# Patient Record
Sex: Male | Born: 1945 | Race: White | Hispanic: No | Marital: Married | Smoking: Former smoker
Health system: Southern US, Community
[De-identification: ages and names within clinical notes are randomized; demographics above are authoritative.]

## PROBLEM LIST (undated history)

## (undated) DIAGNOSIS — D696 Thrombocytopenia, unspecified: Secondary | ICD-10-CM

## (undated) DIAGNOSIS — N400 Enlarged prostate without lower urinary tract symptoms: Secondary | ICD-10-CM

## (undated) DIAGNOSIS — R634 Abnormal weight loss: Secondary | ICD-10-CM

## (undated) DIAGNOSIS — D61818 Other pancytopenia: Secondary | ICD-10-CM

## (undated) DIAGNOSIS — K5900 Constipation, unspecified: Secondary | ICD-10-CM

## (undated) HISTORY — PX: APPENDECTOMY: SHX54

## (undated) HISTORY — DX: Constipation, unspecified: K59.00

## (undated) HISTORY — DX: Benign prostatic hyperplasia without lower urinary tract symptoms: N40.0

## (undated) HISTORY — DX: Abnormal weight loss: R63.4

## (undated) HISTORY — PX: EYE SURGERY: SHX253

## (undated) HISTORY — DX: Other pancytopenia: D61.818

## (undated) HISTORY — DX: Thrombocytopenia, unspecified: D69.6

## (undated) MED FILL — Dexamethasone Sodium Phosphate Inj 100 MG/10ML: INTRAMUSCULAR | Qty: 2 | Status: AC

---

## 1999-09-05 ENCOUNTER — Emergency Department (HOSPITAL_COMMUNITY): Admission: EM | Admit: 1999-09-05 | Discharge: 1999-09-06 | Payer: Self-pay | Admitting: Emergency Medicine

## 1999-09-06 ENCOUNTER — Emergency Department (HOSPITAL_COMMUNITY): Admission: EM | Admit: 1999-09-06 | Discharge: 1999-09-06 | Payer: Self-pay | Admitting: Emergency Medicine

## 1999-09-06 ENCOUNTER — Encounter: Payer: Self-pay | Admitting: Emergency Medicine

## 2000-03-04 ENCOUNTER — Encounter: Payer: Self-pay | Admitting: Internal Medicine

## 2000-03-04 ENCOUNTER — Observation Stay (HOSPITAL_COMMUNITY): Admission: AD | Admit: 2000-03-04 | Discharge: 2000-03-05 | Payer: Self-pay | Admitting: Internal Medicine

## 2000-03-05 ENCOUNTER — Encounter: Payer: Self-pay | Admitting: Internal Medicine

## 2002-07-29 DIAGNOSIS — N400 Enlarged prostate without lower urinary tract symptoms: Secondary | ICD-10-CM

## 2002-07-29 HISTORY — DX: Benign prostatic hyperplasia without lower urinary tract symptoms: N40.0

## 2007-09-06 ENCOUNTER — Emergency Department (HOSPITAL_COMMUNITY): Admission: EM | Admit: 2007-09-06 | Discharge: 2007-09-06 | Payer: Self-pay | Admitting: Emergency Medicine

## 2008-03-09 ENCOUNTER — Ambulatory Visit: Payer: Self-pay | Admitting: Vascular Surgery

## 2009-07-29 HISTORY — PX: TESTICLE SURGERY: SHX794

## 2009-08-08 ENCOUNTER — Ambulatory Visit (HOSPITAL_BASED_OUTPATIENT_CLINIC_OR_DEPARTMENT_OTHER): Admission: RE | Admit: 2009-08-08 | Discharge: 2009-08-08 | Payer: Self-pay | Admitting: Urology

## 2010-10-14 LAB — BASIC METABOLIC PANEL
BUN: 14 mg/dL (ref 6–23)
CO2: 29 mEq/L (ref 19–32)
Calcium: 9.1 mg/dL (ref 8.4–10.5)
Chloride: 103 mEq/L (ref 96–112)
Creatinine, Ser: 1.09 mg/dL (ref 0.4–1.5)
GFR calc Af Amer: >60 mL/min (ref >60)
GFR calc non Af Amer: >60 mL/min (ref >60)
Glucose, Bld: 98 mg/dL (ref 70–99)
Potassium: 4.3 mEq/L (ref 3.5–5.1)
Sodium: 138 mEq/L (ref 135–145)

## 2010-10-14 LAB — CBC
HCT: 39.4 % (ref 39.0–52.0)
Hemoglobin: 13.5 g/dL (ref 13.0–17.0)
MCHC: 34.2 g/dL (ref 30.0–36.0)
MCV: 89.9 fL (ref 78.0–100.0)
Platelets: 174 10*3/uL (ref 150–400)
RBC: 4.38 MIL/uL (ref 4.22–5.81)
RDW: 13 % (ref 11.5–15.5)
WBC: 4.2 10*3/uL (ref 4.0–10.5)

## 2010-12-11 NOTE — Consult Note (Signed)
NEW PATIENT CONSULTATION   Brian Levine  DOB:  12-12-1945                                       03/09/2008  ZOXWR#:[id number]   The patient presents today for evaluation of left leg venous pathology.  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  He denies any  discomfort with this, but was concerned regarding the progressive  nature.  He does not have any pain associated with this.  He does not  have any history of DVT- and does not have any swelling or otherwise leg  pain.  He does have a family history of varicosities in his mother.  He  is on no medications for pain.  He has not worn compression garments.  He is retired.   CURRENT MEDICATIONS:  Doxazosin and treatment for BPH.   He does not smoke cigarettes.   PHYSICAL EXAM:  Well-developed, well-nourished white male appearing  stated age of 65.  Blood pressure is 142/91, pulse 77, respirations 18.  His dorsalis pedis pulses are 2+ bilaterally.  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   I imaged this with hand held duplex.  It does have a connection at the  upper end of this varicosity that goes through the fascia.  He does not  have any pain associated with this, and it does not communicate with the  saphenous system.  His saphenous veins appear normal on the screening  study.  I discussed this at length with the patient.  I explained that  this does not preclude him to any more serious complications with venous  pathology, specifically any deep vein thrombosis.  I explained that the  indications for treatment would be for pain, which he does not have, or  from an appearance standpoint, which he is not concerned about.  He is  comfortable with discussion and will see [place] again on an as needed basis.   Larina Earthly, M.D.  Electronically Signed   TFE/MEDQ  D:  03/09/2008  T:  03/10/2008  Job:  [id number]

## 2010-12-14 NOTE — H&P (Signed)
[place]  Patient:    Brian Levine, Brian Levine                           MRN: [id number] Proc. Date: 03/04/00 Adm. Date:  03/04/00 Attending:  Thora Lance, M.D.                         History and Physical  CHIEF COMPLAINT: Chest pressure.  HISTORY OF PRESENT ILLNESS: This patient is a 65 year old white male who presents with chest pressure yesterday while digging a trench at home.  He had several episodes of retrosternal chest pressure associated with shortness of breath and fatigue.  The pressure would go away in minutes with rest.  This morning he had prolonged mild chest pressure while driving with minimal exertion and mild shortness of breath.  This has now resolved in our office. His cardiac risk factors include history of tobacco use and a family history of coronary artery disease.  PAST MEDICAL HISTORY:  1. Benign prostatic hypertrophy.  2. Nephrolithiasis.  3. Right hydrocele.  PAST SURGICAL HISTORY:  1. Excision of scrotal mass in 1970.  2. Appendectomy as a child.  ALLERGIES: No known drug allergies.  CURRENT MEDICATIONS:  1. Vitamin C 500 mg q.d.  2. Vitamin E 400 IU q.d.  4. Enteric-coated aspirin 325 mg q.d.  FAMILY HISTORY: Father, coronary disease in his 65s and died of MI/congestive heart failure at age 76.  Had hypertension and stroke.  Mother alive at age 59, history of MI in her 25s.  Brother with hypertension, possible renal cancer.  Brother and daughter in good health.  SOCIAL HISTORY: The patient had been smoking two packs of cigarettes a day for 33 years and quite five years ago.  No drug or alcohol use.  He is married and has one daughter.  Occupation is Retail banker for [employer].  REVIEW OF SYSTEMS: Negative.  PHYSICAL EXAMINATION:  GENERAL: He looks well in the office.  VITAL SIGNS: Blood pressure 134/80, heart rate 88, temperature 98.0 degrees.  HEENT: PERRL.  EOMI.  Tympanic membranes clear.  Oral  cavity and oropharynx clear.  NECK: Supple.  No lymphadenopathy, no carotid bruits.  LUNGS: Clear.  HEART: Regular rate and rhythm without murmurs, rubs, or gallops.  No JVD.  ABDOMEN: Soft, nontender.  Normal bowel sounds.  No mass or hepatosplenomegaly.  RECTAL: Normal tone, Hemoccult negative stool.  GU: Large right hydrocele noted.  EXTREMITIES: No edema.  NEUROLOGIC: Nonfocal.  LABORATORY DATA: Laboratories are pending.  EKG shows normal sinus rhythm, normal EKG.  ASSESSMENT: Chest pressure, rule out unstable angina pectoris.  PLAN: Observation.  Lovenox subQ.  Aspirin.  Nitroglycerin p.r.n. chest pain. Stress Cardiolite.  Cardiac enzymes. DD:  03/04/00 TD:  03/04/00 Job: [id number]

## 2011-04-19 LAB — POCT CARDIAC MARKERS
CKMB, poc: <1 — ABNORMAL LOW
CKMB, poc: <1 — ABNORMAL LOW
Myoglobin, poc: 41.9
Myoglobin, poc: 59.8
Operator id: 284251
Operator id: 285491
Troponin i, poc: <0.05
Troponin i, poc: <0.05

## 2011-04-19 LAB — I-STAT 8, (EC8 V) (CONVERTED LAB)
Acid-Base Excess: 1
BUN: 15
Bicarbonate: 25.2 — ABNORMAL HIGH
Chloride: 107
Glucose, Bld: 98
HCT: 42
Hemoglobin: 14.3
Operator id: 285491
Potassium: 4.2
Sodium: 139
TCO2: 26
pCO2, Ven: 37.8 — ABNORMAL LOW
pH, Ven: 7.432 — ABNORMAL HIGH

## 2011-04-19 LAB — POCT I-STAT CREATININE
Creatinine, Ser: 1.1
Operator id: 285491

## 2012-04-29 ENCOUNTER — Other Ambulatory Visit: Payer: Self-pay | Admitting: Urology

## 2012-04-29 DIAGNOSIS — N4340 Spermatocele of epididymis, unspecified: Secondary | ICD-10-CM

## 2012-06-15 ENCOUNTER — Ambulatory Visit (HOSPITAL_COMMUNITY): Payer: Medicare Other

## 2013-04-16 ENCOUNTER — Telehealth: Payer: Self-pay | Admitting: Hematology and Oncology

## 2013-04-16 ENCOUNTER — Telehealth: Payer: Self-pay | Admitting: Internal Medicine

## 2013-04-16 NOTE — Telephone Encounter (Signed)
LM W/WIFE FOR PT TO RETURN CALL IN RE TO REFERRAL

## 2013-04-16 NOTE — Telephone Encounter (Signed)
S/W PT AND [facility] PT APPT 10/07 @ 10:30 W/DR. GORSUCH REFERRING DR. Jonny Ruiz GRIFFIN  DX- MILD ANEMIA AND THROMBOCYTOPENIA WELCOME PACKET MAILED.

## 2013-04-16 NOTE — Telephone Encounter (Signed)
C/D 04/16/13 for appt. 10/7

## 2013-05-04 ENCOUNTER — Telehealth: Payer: Self-pay | Admitting: Hematology and Oncology

## 2013-05-04 ENCOUNTER — Ambulatory Visit (HOSPITAL_BASED_OUTPATIENT_CLINIC_OR_DEPARTMENT_OTHER): Payer: Medicare Other | Admitting: Lab

## 2013-05-04 ENCOUNTER — Ambulatory Visit (HOSPITAL_BASED_OUTPATIENT_CLINIC_OR_DEPARTMENT_OTHER): Payer: Medicare Other | Admitting: Hematology and Oncology

## 2013-05-04 ENCOUNTER — Encounter: Payer: Self-pay | Admitting: Hematology and Oncology

## 2013-05-04 ENCOUNTER — Ambulatory Visit: Payer: Medicare Other

## 2013-05-04 VITALS — BP 145/69 | HR 68 | Temp 97.5°F | Resp 20 | Ht 71.0 in | Wt 177.5 lb

## 2013-05-04 DIAGNOSIS — R634 Abnormal weight loss: Secondary | ICD-10-CM

## 2013-05-04 DIAGNOSIS — D61818 Other pancytopenia: Secondary | ICD-10-CM

## 2013-05-04 DIAGNOSIS — R109 Unspecified abdominal pain: Secondary | ICD-10-CM

## 2013-05-04 HISTORY — DX: Abnormal weight loss: R63.4

## 2013-05-04 HISTORY — DX: Other pancytopenia: D61.818

## 2013-05-04 LAB — CBC & DIFF AND RETIC
BASO%: 0.3 % (ref 0.0–2.0)
Basophils Absolute: 0 10*3/uL (ref 0.0–0.1)
EOS%: 5.4 % (ref 0.0–7.0)
Eosinophils Absolute: 0.2 10*3/uL (ref 0.0–0.5)
HCT: 39.2 % (ref 38.4–49.9)
HGB: 13.7 g/dL (ref 13.0–17.1)
Immature Retic Fract: 1.7 % — ABNORMAL LOW (ref 3.00–10.60)
LYMPH%: 30.4 % (ref 14.0–49.0)
MCH: 30.8 pg (ref 27.2–33.4)
MCHC: 34.9 g/dL (ref 32.0–36.0)
MCV: 88.1 fL (ref 79.3–98.0)
MONO#: 0.2 10*3/uL (ref 0.1–0.9)
MONO%: 6 % (ref 0.0–14.0)
NEUT#: 2 10*3/uL (ref 1.5–6.5)
NEUT%: 57.9 % (ref 39.0–75.0)
Platelets: 119 10*3/uL — ABNORMAL LOW (ref 140–400)
RBC: 4.45 10*6/uL (ref 4.20–5.82)
RDW: 13 % (ref 11.0–14.6)
Retic %: 0.78 % — ABNORMAL LOW (ref 0.80–1.80)
Retic Ct Abs: 34.71 10*3/uL — ABNORMAL LOW (ref 34.80–93.90)
WBC: 3.5 10*3/uL — ABNORMAL LOW (ref 4.0–10.3)
lymph#: 1.1 10*3/uL (ref 0.9–3.3)
nRBC: 0 % (ref 0–0)

## 2013-05-04 LAB — CHCC SMEAR

## 2013-05-04 LAB — COMPREHENSIVE METABOLIC PANEL (CC13)
ALT: 12 U/L (ref 0–55)
AST: 18 U/L (ref 5–34)
Albumin: 3.8 g/dL (ref 3.5–5.0)
Alkaline Phosphatase: 57 U/L (ref 40–150)
Anion Gap: 6 mEq/L (ref 3–11)
BUN: 14.3 mg/dL (ref 7.0–26.0)
CO2: 29 mEq/L (ref 22–29)
Calcium: 9.6 mg/dL (ref 8.4–10.4)
Chloride: 105 mEq/L (ref 98–109)
Creatinine: 1 mg/dL (ref 0.7–1.3)
Glucose: 97 mg/dl (ref 70–140)
Potassium: 4.3 mEq/L (ref 3.5–5.1)
Sodium: 140 mEq/L (ref 136–145)
Total Bilirubin: 1.06 mg/dL (ref 0.20–1.20)
Total Protein: 7.2 g/dL (ref 6.4–8.3)

## 2013-05-04 LAB — TSH CHCC: TSH: 2.647 m(IU)/L (ref 0.320–4.118)

## 2013-05-04 LAB — FERRITIN CHCC: Ferritin: 90 ng/ml (ref 22–316)

## 2013-05-04 NOTE — Progress Notes (Signed)
[facility] CONSULT NOTE  CHIEF COMPLAINTS/PURPOSE OF CONSULTATION: Pancytopenia for further management  HISTORY OF PRESENTING ILLNESS:  Brian Levine 67 y.o. male is being referred here because of abnormal CBC. I had the opportunity to review his CBC from 2008 11 and at that time it was within normal limits. 6 weeks ago, he complained of an onset of the abdomen no pain which he describes as a dull ache rate started in the lower abdomen region with no radiation. There was no aggravating factors or relieving factors. He did not take any analgesics. It subsequently resolved then returned approximately a week later lasting for one full day. The patient had a colonoscopy before and his screening was up-to-date with no cancer found. He went to see his primary care provider for routine visit and was recommended to have some blood work done. CBC came back mildly abnormal with a white count of 3.5 hemoglobin 13.5 and platelet, 132,000. Serum chemistries were normal. From the abdomen the pain described above, he denies any history of recurrent infection. He cannot remember when was the last time he was prescribed antibiotic therapy. Never hospitalized for pneumonia and never suffer from atypical infection such as shingles. He denies any spontaneous bruising, epistaxis, hematuria, or hematochezia. The patient did have history of kidney stone before. He complained of some right testicular swelling and had surgery twice, the last one was approximately 3 years ago but did not help with this testicular swelling. Most concerning is he has chronic weight loss for the last 5 years he has lost 40 pounds but he felt it was because of his effort of eating healthy and increased level of activity with regular exercise.  MEDICAL HISTORY:  Past Medical History  Diagnosis Date  . BPH (benign prostatic hyperplasia) 2004  . Weight loss 05/04/2013  . 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 Appendectomy    . Testicle surgery Right 2011    hydrocele    SOCIAL HISTORY: History   Social History  . Marital Status: Married    Spouse Name: N/A    Number of Children: N/A  . Years of Education: N/A   Occupational History  . Not on file.   Social History Main Topics  . Smoking status: Former Smoker -- 1.50 packs/day for 30 years    Quit date: 07/30/1995  . Smokeless tobacco: Never Used  . Alcohol Use: No  . Drug Use: No  . Sexual Activity: Not on file   Other Topics Concern  . Not on file   Social History Narrative  . No narrative on file    FAMILY HISTORY: History reviewed. No pertinent family history.  ALLERGIES:  has no allergies on file.  MEDICATIONS: Current outpatient prescriptions:aspirin 81 MG tablet, Take 81 mg by mouth daily., Disp: , Rfl: ;  cholecalciferol (VITAMIN D) 1000 UNITS tablet, Take 1,000 Units by mouth daily., Disp: , Rfl: ;  doxazosin (CARDURA) 8 MG tablet, Take 8 mg by mouth at bedtime., Disp: , Rfl: ;  finasteride (PROSCAR) 5 MG tablet, Take 5 mg by mouth daily., Disp: , Rfl:   REVIEW OF SYSTEMS:   Constitutional: Denies fevers, chills Eyes: Denies blurriness of vision, double vision or watery eyes Ears, nose, mouth, throat, and face: Denies mucositis or sore throat Respiratory: Denies cough, dyspnea or wheezes Cardiovascular: Denies palpitation, chest discomfort or lower extremity swelling Gastrointestinal:  Denies nausea, heartburn or change in bowel habits  Skin: Denies abnormal skin rashes Lymphatics: Denies new lymphadenopathy or easy bruising Neurological:Denies numbness, tingling or new weaknesses Behavioral/Psych: Mood is stable, no new changes  All other systems were reviewed with the patient and are negative.  PHYSICAL EXAMINATION: ECOG PERFORMANCE STATUS: 0 - Asymptomatic  Filed Vitals:   05/04/13 1025  BP: 145/69  Pulse: 68  Temp: 97.5 F (36.4 C)  Resp: 20   Filed  Weights   05/04/13 1025  Weight: 177 lb 8 oz (80.513 kg)    GENERAL:alert, no distress and comfortable SKIN: skin color, texture, turgor are normal, no rashes or significant lesions EYES: normal, conjunctiva are pink and non-injected, sclera clear OROPHARYNX:no exudate, no erythema and lips, buccal mucosa, and tongue normal  NECK: supple, thyroid normal size, non-tender, without nodularity LYMPH:  no palpable lymphadenopathy in the cervical, axillary or inguinal LUNGS: clear to auscultation and percussion with normal breathing effort HEART: regular rate & rhythm and no murmurs and no lower extremity edema ABDOMEN:abdomen soft, non-tender and normal bowel sounds no rebound, guarding, or splenomegaly Musculoskeletal:no cyanosis of digits and no clubbing  PSYCH: alert & oriented x 3 with fluent speech NEURO: no focal motor/sensory deficits  LABORATORY DATA:  I have reviewed the data as listed  ASSESSMENT:  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 He is asymptomatic. I am most concerned about the weight loss. The patient once his workup in a stepwise fashion which I don't think is unreasonable. I will go ahead and order some blood work today and see him back in a week for further assessment. If his blood work is unrevealing he might require a bone marrow aspirate and biopsy to rule out malignancy #2 recent abdominalpain #3 recent weight loss #4 chronic right testiclular swelling His chronic right testiclular swelling was due to hydrocele per patient. In concern about his recent pain in his abdomen and weight loss. I recommend consideration for CT scan of his abdomen but the patient would like to wait until we review the test results of his blood. I don't think this is unreasonable. I will see the patient back end of this  week to review test results. In the meantime I recommend you continue all his prescribe medication.   All questions were answered. The patient knows to call the clinic with any problems, questions or concerns. I can certainly see the patient much sooner if necessary. No barriers to learning was detected.  I spent 25 minutes counseling the patient face to face. The total time spent in the appointment was 40 minutes and more than 50% was on counseling.     GORSUCH, NI, MD 05/04/2013 10:59 AM

## 2013-05-04 NOTE — Progress Notes (Signed)
Checked in new pt with no financial concerns. °

## 2013-05-04 NOTE — Telephone Encounter (Signed)
gv adn printed appt sched and avs for pt for OCT...sent pt back to the lab

## 2013-05-07 ENCOUNTER — Ambulatory Visit (HOSPITAL_BASED_OUTPATIENT_CLINIC_OR_DEPARTMENT_OTHER): Payer: Medicare Other | Admitting: Hematology and Oncology

## 2013-05-07 ENCOUNTER — Telehealth: Payer: Self-pay | Admitting: Hematology and Oncology

## 2013-05-07 VITALS — BP 157/70 | HR 74 | Temp 97.7°F | Resp 20 | Ht 71.0 in | Wt 180.4 lb

## 2013-05-07 DIAGNOSIS — R634 Abnormal weight loss: Secondary | ICD-10-CM

## 2013-05-07 DIAGNOSIS — Z87891 Personal history of nicotine dependence: Secondary | ICD-10-CM

## 2013-05-07 DIAGNOSIS — D61818 Other pancytopenia: Secondary | ICD-10-CM

## 2013-05-07 NOTE — Progress Notes (Signed)
[facility] OFFICE PROGRESS NOTE  Lillia Mountain, MD  DIAGNOSIS: Mild pancytopenia  SUMMARY OF  HISTORY: Please see my recent dictation for further evaluation. Who is being referred here because of abdomen no pain that led to further evaluation and then he was found to every mild pancytopenia.  INTERVAL HISTORY: Brian Levine 67 y.o. male returns for return visit related to the history of pancytopenia.  Since the last time I saw him, he has no new complaints. No recent abdomen no pain. No recent infection bruising or bleeding.  I have reviewed the past medical history, past surgical history, social history and family history with the patient and they are unchanged from previous note.  ALLERGIES:  has no allergies on file.  MEDICATIONS: Current outpatient prescriptions:aspirin 81 MG tablet, Take 81 mg by mouth daily., Disp: , Rfl: ;  cholecalciferol (VITAMIN D) 1000 UNITS tablet, Take 1,000 Units by mouth daily., Disp: , Rfl: ;  doxazosin (CARDURA) 8 MG tablet, Take 8 mg by mouth at bedtime., Disp: , Rfl: ;  finasteride (PROSCAR) 5 MG tablet, Take 5 mg by mouth daily., Disp: , Rfl:   REVIEW OF SYSTEMS:   Constitutional: Denies fevers, chills or abnormal weight loss All other systems were reviewed with the patient and are negative.  PHYSICAL EXAMINATION: ECOG PERFORMANCE STATUS: 0 - Asymptomatic  Filed Vitals:   05/07/13 1500  BP: 157/70  Pulse: 74  Temp: 97.7 F (36.5 C)  Resp: 20   Filed Weights   05/07/13 1500  Weight: 180 lb 6.4 oz (81.829 kg)    GENERAL:alert, no distress and comfortable NEURO: alert & oriented x 3 with fluent speech, no focal motor/sensory deficits  LABORATORY DATA:  I have reviewed the data as listed    Component Value Date/Time   NA 140 05/04/2013 1145   NA 138 08/03/2009 1700   K 4.3 05/04/2013 1145   K 4.3 08/03/2009 1700   CL 103 08/03/2009 1700   CO2 29 05/04/2013 1145   CO2 29 08/03/2009 1700   GLUCOSE 97 05/04/2013 1145   GLUCOSE 98 08/03/2009 1700   BUN 14.3 05/04/2013 1145   BUN 14 08/03/2009 1700   CREATININE 1.0 05/04/2013 1145   CREATININE 1.09 08/03/2009 1700   CALCIUM 9.6 05/04/2013 1145   CALCIUM 9.1 08/03/2009 1700   PROT 7.2 05/04/2013 1145   ALBUMIN 3.8 05/04/2013 1145   AST 18 05/04/2013 1145   ALT 12 05/04/2013 1145   ALKPHOS 57 05/04/2013 1145   BILITOT 1.06 05/04/2013 1145   GFRNONAA >60 08/03/2009 1700   GFRAA  Value: >60        The eGFR has been calculated using the MDRD equation. This calculation has not been validated in all clinical situations. eGFR's persistently <60 mL/min signify possible Chronic Kidney Disease. 08/03/2009 1700    No results found for this basename: SPEP, UPEP,  kappa and lambda light chains    Lab Results  Component Value Date   WBC 3.5* 05/04/2013   NEUTROABS 2.0 05/04/2013   HGB 13.7 05/04/2013   HCT 39.2 05/04/2013   MCV 88.1 05/04/2013   PLT 119* 05/04/2013      Chemistry      Component Value Date/Time   NA 140 05/04/2013 1145   NA 138 08/03/2009 1700   K 4.3 05/04/2013 1145   K 4.3 08/03/2009 1700   CL 103 08/03/2009 1700   CO2 29 05/04/2013 1145   CO2 29 08/03/2009 1700   BUN 14.3 05/04/2013 1145  BUN 14 08/03/2009 1700   CREATININE 1.0 05/04/2013 1145   CREATININE 1.09 08/03/2009 1700      Component Value Date/Time   CALCIUM 9.6 05/04/2013 1145   CALCIUM 9.1 08/03/2009 1700   ALKPHOS 57 05/04/2013 1145   AST 18 05/04/2013 1145   ALT 12 05/04/2013 1145   BILITOT 1.06 05/04/2013 1145     I personally reviewed the patient's peripheral blood smear today.  There was no peripheral blast.  There were a few atypical lymphocytes which are large. Otherwise the white blood cells and red blood cells were of normal morphology. There was no schistocytosis or anisocytosis.  The platelets are of normal size and I have verified that there were no platelet clumping. ASSESSMENT: Mild pancytopenia with recent weight loss  PLAN:  #1 mild pancytopenia The patient is asymptomatic. The degree of  abnormalities are mild. He does not require transfusion. However given his history weight loss, recommend CT scan of chest,  abdomen and pelvis to rule out disease process such as lymphoma. If the scans are revealing, we might have to proceed with bone marrow aspirate and biopsy. #2 prior history of smoking and recent weight loss I will recommend CT scan of the chest, abdomen, and pelvis for evaluation to rule out lymphoma  All questions were answered. The patient knows to call the clinic with any problems, questions or concerns. We can certainly see the patient much sooner if necessary. No barriers to learning was detected.      [facility], NI, MD 05/07/2013 4:37 PM

## 2013-05-07 NOTE — Telephone Encounter (Signed)
pt rs for 1021 at 1030a 15 min fup per Dr Reece Agar order adv Centralized Sched will call to add on CT Barium given to pt shh

## 2013-05-11 ENCOUNTER — Telehealth: Payer: Self-pay | Admitting: Hematology and Oncology

## 2013-05-11 NOTE — Telephone Encounter (Signed)
s.w. pt and advised on 10.15.14 ct at [address]...pt ok and aware

## 2013-05-11 NOTE — Telephone Encounter (Signed)
Gave referral to Thelma Barge for precert of Ct Chest/Abd/Pelvis scheduled @ [facility]

## 2013-05-12 ENCOUNTER — Ambulatory Visit
Admission: RE | Admit: 2013-05-12 | Discharge: 2013-05-12 | Disposition: A | Discharge status: Home / Self Care | Payer: Medicare Other | Source: Ambulatory Visit | Attending: Hematology and Oncology | Admitting: Hematology and Oncology

## 2013-05-12 DIAGNOSIS — D61818 Other pancytopenia: Secondary | ICD-10-CM

## 2013-05-12 DIAGNOSIS — R634 Abnormal weight loss: Secondary | ICD-10-CM

## 2013-05-12 MED ORDER — IOHEXOL 300 MG/ML  SOLN
100.0000 mL | Freq: Once | INTRAMUSCULAR | Status: AC | PRN
  Administered 2013-05-12: 100 mL via INTRAVENOUS

## 2013-05-14 LAB — VITAMIN B12 DEFICIENCY PANEL - CHCC

## 2013-05-14 LAB — T4, FREE: Free T4: 1.14 ng/dL (ref 0.80–1.80)

## 2013-05-14 LAB — ANA: Anti Nuclear Antibody(ANA): NEGATIVE

## 2013-05-18 ENCOUNTER — Ambulatory Visit (HOSPITAL_BASED_OUTPATIENT_CLINIC_OR_DEPARTMENT_OTHER): Payer: Medicare Other | Admitting: Hematology and Oncology

## 2013-05-18 VITALS — BP 141/79 | HR 65 | Temp 97.3°F | Resp 20 | Ht 71.0 in | Wt 179.1 lb

## 2013-05-18 DIAGNOSIS — R911 Solitary pulmonary nodule: Secondary | ICD-10-CM

## 2013-05-18 DIAGNOSIS — D72819 Decreased white blood cell count, unspecified: Secondary | ICD-10-CM

## 2013-05-18 DIAGNOSIS — D61818 Other pancytopenia: Secondary | ICD-10-CM

## 2013-05-18 NOTE — Progress Notes (Signed)
[facility] OFFICE PROGRESS NOTE  Lillia Mountain, MD DIAGNOSIS:  Leukopenia and thrombocytopenia  SUMMARY OF HEMATOLOGIC HISTORY: Please see my detailed dictation from 05/07/2013 for further details. This pleasant gentleman initially presented with pain in his epigastric region with associated weight loss and during the workup was found to have very mild pancytopenia and was referred here for further evaluation INTERVAL HISTORY: [name] 67 y.o. male returns for review of test results. In the meantime he noted some easy bruising on his left forearm. The recent spontaneous bleeding. His abdominal pain has resolved. He has not lost any more weight since he was here. No recent infection.  I have reviewed the past medical history, past surgical history, social history and family history with the patient and they are unchanged from previous note.  ALLERGIES:  has No Known Allergies.  MEDICATIONS:  Current Outpatient Prescriptions  Medication Sig Dispense Refill  . aspirin 81 MG tablet Take 81 mg by mouth daily.      . cholecalciferol (VITAMIN D) 1000 UNITS tablet Take 1,000 Units by mouth daily.      Marland Kitchen doxazosin (CARDURA) 8 MG tablet Take 8 mg by mouth at bedtime.      . finasteride (PROSCAR) 5 MG tablet Take 5 mg by mouth daily.       No current facility-administered medications for this visit.     REVIEW OF SYSTEMS:   Constitutional: Denies fevers, chills or night sweats Behavioral/Psych: Mood is stable, no new changes  All other systems were reviewed with the patient and are negative.  PHYSICAL EXAMINATION: ECOG PERFORMANCE STATUS: 0 - Asymptomatic  Filed Vitals:   05/18/13 1019  BP: 141/79  Pulse: 65  Temp: 97.3 F (36.3 C)  Resp: 20   Filed Weights   05/18/13 1019  Weight: 179 lb 1.6 oz (81.239 kg)    GENERAL:alert, no distress and comfortable NEURO: alert & oriented x 3 with fluent speech, no focal motor/sensory deficits  LABORATORY DATA:  I  have reviewed the data as listed. I reviewed the blood test results with him which was unrevealing. RADIOGRAPHIC STUDIES: I have personally reviewed the radiological images as listed and agreed with the findings in the report. I reviewed the CT scan of the chest, abdomen, and pelvis which showed no evidence to suggest lymphoma. Incidentally there were 2 lung nodules detected in his lung.  ASSESSMENT:  Leukopenia and thrombocytopenia Recent weight loss and pain in the abdomen Lung nodules  PLAN:  #1 leukopenia The cause is unknown. The patient denies recent history of fevers, cough, chills, diarrhea or dysuria. He is asymptomatic from the leukopenia. I will observe for now.  #2 thrombocytopenia The cause is unknown. It is mild and there is little change compared from previous platelet count. The patient denies recent history of bleeding such as epistaxis, hematuria or hematochezia. He is asymptomatic from the thrombocytopenia. I will observe for now.  he does not require transfusion now.   I did discuss with the patient possibility of pursuing additional workup including a bone marrow aspirate and biopsy to evaluate for leukopenia and thrombocytopenia. The patient is not enthusiastic. Alternatively, we can just observe and follow with me in 3 months.  #3 recent weight loss and pain in the abdomen The CT scan did not show any gross abnormalities. However, I told the patient, he might still need an EGD evaluation to rule out stomach ulcer or cancer in the stomach that might not show on the CT scan. The  patient will think about it. #4 lung nodules The patient had prior history of smoking. The lung nodules are very small and are likely benign. We will follow the CT lung nodules protocol and repeat another noncontrast CT scan in 6-12 months in the future to monitor.  All questions were answered. The patient knows to call the clinic with any problems, questions or concerns. No barriers to learning was  detected.    GORSUCH, NI, MD 05/18/2013 10:51 AM

## 2013-05-20 ENCOUNTER — Telehealth: Payer: Self-pay | Admitting: Hematology and Oncology

## 2013-05-20 NOTE — Telephone Encounter (Signed)
, °

## 2013-06-03 ENCOUNTER — Other Ambulatory Visit: Payer: Self-pay

## 2013-08-17 ENCOUNTER — Other Ambulatory Visit (HOSPITAL_BASED_OUTPATIENT_CLINIC_OR_DEPARTMENT_OTHER): Payer: Medicare HMO

## 2013-08-17 ENCOUNTER — Ambulatory Visit (HOSPITAL_BASED_OUTPATIENT_CLINIC_OR_DEPARTMENT_OTHER): Payer: Medicare HMO | Admitting: Hematology and Oncology

## 2013-08-17 ENCOUNTER — Encounter: Payer: Self-pay | Admitting: Hematology and Oncology

## 2013-08-17 VITALS — BP 144/68 | HR 60 | Temp 97.4°F | Resp 18 | Ht 71.0 in | Wt 180.7 lb

## 2013-08-17 DIAGNOSIS — D696 Thrombocytopenia, unspecified: Secondary | ICD-10-CM

## 2013-08-17 DIAGNOSIS — D72819 Decreased white blood cell count, unspecified: Secondary | ICD-10-CM | POA: Insufficient documentation

## 2013-08-17 DIAGNOSIS — D61818 Other pancytopenia: Secondary | ICD-10-CM

## 2013-08-17 HISTORY — DX: Thrombocytopenia, unspecified: D69.6

## 2013-08-17 LAB — COMPREHENSIVE METABOLIC PANEL (CC13)
ALT: 16 U/L (ref 0–55)
AST: 19 U/L (ref 5–34)
Albumin: 4 g/dL (ref 3.5–5.0)
Alkaline Phosphatase: 56 U/L (ref 40–150)
Anion Gap: 9 mEq/L (ref 3–11)
BUN: 12.1 mg/dL (ref 7.0–26.0)
CO2: 27 mEq/L (ref 22–29)
Calcium: 9.5 mg/dL (ref 8.4–10.4)
Chloride: 104 mEq/L (ref 98–109)
Creatinine: 1.1 mg/dL (ref 0.7–1.3)
Glucose: 98 mg/dl (ref 70–140)
Potassium: 4.5 mEq/L (ref 3.5–5.1)
Sodium: 140 mEq/L (ref 136–145)
Total Bilirubin: 1.04 mg/dL (ref 0.20–1.20)
Total Protein: 7.1 g/dL (ref 6.4–8.3)

## 2013-08-17 LAB — CBC WITH DIFFERENTIAL/PLATELET
BASO%: 0.6 % (ref 0.0–2.0)
Basophils Absolute: 0 10*3/uL (ref 0.0–0.1)
EOS%: 6.3 % (ref 0.0–7.0)
Eosinophils Absolute: 0.2 10*3/uL (ref 0.0–0.5)
HCT: 40.7 % (ref 38.4–49.9)
HGB: 13.9 g/dL (ref 13.0–17.1)
LYMPH%: 36.3 % (ref 14.0–49.0)
MCH: 30.9 pg (ref 27.2–33.4)
MCHC: 34.1 g/dL (ref 32.0–36.0)
MCV: 90.6 fL (ref 79.3–98.0)
MONO#: 0.3 10*3/uL (ref 0.1–0.9)
MONO%: 7.6 % (ref 0.0–14.0)
NEUT#: 1.9 10*3/uL (ref 1.5–6.5)
NEUT%: 49.2 % (ref 39.0–75.0)
Platelets: 135 10*3/uL — ABNORMAL LOW (ref 140–400)
RBC: 4.5 10*6/uL (ref 4.20–5.82)
RDW: 13.3 % (ref 11.0–14.6)
WBC: 3.8 10*3/uL — ABNORMAL LOW (ref 4.0–10.3)
lymph#: 1.4 10*3/uL (ref 0.9–3.3)

## 2013-08-17 LAB — MORPHOLOGY: PLT EST: DECREASED

## 2013-08-17 NOTE — Progress Notes (Signed)
[facility] OFFICE PROGRESS NOTE  [name], MD DIAGNOSIS:  Leukopenia and thrombocytopenia of unknown etiology  SUMMARY OF HEMATOLOGIC HISTORY: Is a pleasant 63 your gentleman with abnormal weight loss, leukopenia and thrombocytopenia underwent significant   workup. CT scan show no abnormalities to suggest lymphoma or hepatosplenomegaly. His weight loss resolved. The patient was being observed  INTERVAL HISTORY: Brian Levine 68 y.o. male returns for  further followup. He denies any recent fever, chills, night sweats or abnormal weight loss The patient denies any recent signs or symptoms of bleeding such as spontaneous epistaxis, hematuria or hematochezia.  I have reviewed the past medical history, past surgical history, social history and family history with the patient and they are unchanged from previous note.  ALLERGIES:  has No Known Allergies.  MEDICATIONS:  Current Outpatient Prescriptions  Medication Sig Dispense Refill  . aspirin 81 MG tablet Take 81 mg by mouth daily.      . cholecalciferol (VITAMIN D) 1000 UNITS tablet Take 1,000 Units by mouth daily.      Marland Kitchen doxazosin (CARDURA) 8 MG tablet Take 8 mg by mouth at bedtime.      . finasteride (PROSCAR) 5 MG tablet Take 5 mg by mouth daily.      . pravastatin (PRAVACHOL) 40 MG tablet Take 40 mg by mouth daily.       No current facility-administered medications for this visit.     REVIEW OF SYSTEMS:   All other systems were reviewed with the patient and are negative.  PHYSICAL EXAMINATION: ECOG PERFORMANCE STATUS: 0 - Asymptomatic  Filed Vitals:   08/17/13 1128  BP: 144/68  Pulse: 60  Temp: 97.4 F (36.3 C)  Resp: 18   Filed Weights   08/17/13 1128  Weight: 180 lb 11.2 oz (81.965 kg)    GENERAL:alert, no distress and comfortable Musculoskeletal:no cyanosis of digits and no clubbing  NEURO: alert & oriented x 3 with fluent speech, no focal motor/sensory deficits  LABORATORY DATA:  I  have reviewed the data as listed Results for orders placed in visit on 08/17/13 (from the past 48 hour(s))  CBC WITH DIFFERENTIAL     Status: Abnormal   Collection Time    08/17/13 11:18 AM      Result Value Range   WBC 3.8 (*) 4.0 - 10.3 10e3/uL   NEUT# 1.9  1.5 - 6.5 10e3/uL   HGB 13.9  13.0 - 17.1 g/dL   HCT 40.7  38.4 - 49.9 %   Platelets 135 (*) 140 - 400 10e3/uL   MCV 90.6  79.3 - 98.0 fL   MCH 30.9  27.2 - 33.4 pg   MCHC 34.1  32.0 - 36.0 g/dL   RBC 4.50  4.20 - 5.82 10e6/uL   RDW 13.3  11.0 - 14.6 %   lymph# 1.4  0.9 - 3.3 10e3/uL   MONO# 0.3  0.1 - 0.9 10e3/uL   Eosinophils Absolute 0.2  0.0 - 0.5 10e3/uL   Basophils Absolute 0.0  0.0 - 0.1 10e3/uL   NEUT% 49.2  39.0 - 75.0 %   LYMPH% 36.3  14.0 - 49.0 %   MONO% 7.6  0.0 - 14.0 %   EOS% 6.3  0.0 - 7.0 %   BASO% 0.6  0.0 - 2.0 %    Lab Results  Component Value Date   WBC 3.8* 08/17/2013   HGB 13.9 08/17/2013   HCT 40.7 08/17/2013   MCV 90.6 08/17/2013   PLT 135* 08/17/2013  ASSESSMENT & PLAN:  #1 chronic leukopenia #2 chronic thromboytopenia The cause is unknown but I suspect is of benign etiology. His abnormal blood count has improved compared to previous visit. The only thing new since I saw him was he was started was a on a lipid-lowering agent In any case, at this point in time, I do not see a need to pursue further investigation such as bone marrow aspirate and biopsy and I recommend observation only I have not make a return appointment for the patient to come back. I recommend just once a year CBC check would be adequate and as long as the patient is not neutropenic and platelet count is greater than 100,000, I would not recommend further investigations. All questions were answered. The patient knows to call the clinic with any problems, questions or concerns. No barriers to learning was detected.  I spent 15 minutes counseling the patient face to face. The total time spent in the appointment was 20 minutes and  more than 50% was on counseling.     Jordan, [place], MD 08/17/2013 11:58 AM

## 2014-04-08 ENCOUNTER — Encounter: Payer: Self-pay | Admitting: *Deleted

## 2015-01-17 DIAGNOSIS — L237 Allergic contact dermatitis due to plants, except food: Secondary | ICD-10-CM | POA: Diagnosis not present

## 2015-02-02 ENCOUNTER — Other Ambulatory Visit: Payer: Self-pay | Admitting: Internal Medicine

## 2015-02-02 ENCOUNTER — Ambulatory Visit
Admission: RE | Admit: 2015-02-02 | Discharge: 2015-02-02 | Disposition: A | Discharge status: Home / Self Care | Payer: Medicare HMO | Source: Ambulatory Visit | Attending: Internal Medicine | Admitting: Internal Medicine

## 2015-02-02 DIAGNOSIS — Z87891 Personal history of nicotine dependence: Secondary | ICD-10-CM | POA: Diagnosis not present

## 2015-02-02 DIAGNOSIS — R5383 Other fatigue: Secondary | ICD-10-CM | POA: Diagnosis not present

## 2015-02-02 DIAGNOSIS — R634 Abnormal weight loss: Secondary | ICD-10-CM

## 2015-04-11 DIAGNOSIS — E780 Pure hypercholesterolemia: Secondary | ICD-10-CM | POA: Diagnosis not present

## 2015-04-11 DIAGNOSIS — D126 Benign neoplasm of colon, unspecified: Secondary | ICD-10-CM | POA: Diagnosis not present

## 2015-04-11 DIAGNOSIS — N401 Enlarged prostate with lower urinary tract symptoms: Secondary | ICD-10-CM | POA: Diagnosis not present

## 2015-04-11 DIAGNOSIS — Z23 Encounter for immunization: Secondary | ICD-10-CM | POA: Diagnosis not present

## 2015-04-11 DIAGNOSIS — Z1389 Encounter for screening for other disorder: Secondary | ICD-10-CM | POA: Diagnosis not present

## 2015-04-11 DIAGNOSIS — Z0000 Encounter for general adult medical examination without abnormal findings: Secondary | ICD-10-CM | POA: Diagnosis not present

## 2015-05-04 DIAGNOSIS — H02849 Edema of unspecified eye, unspecified eyelid: Secondary | ICD-10-CM | POA: Diagnosis not present

## 2015-05-04 DIAGNOSIS — R21 Rash and other nonspecific skin eruption: Secondary | ICD-10-CM | POA: Diagnosis not present

## 2015-05-25 ENCOUNTER — Other Ambulatory Visit: Payer: Self-pay | Admitting: Gastroenterology

## 2015-05-31 DIAGNOSIS — N4340 Spermatocele of epididymis, unspecified: Secondary | ICD-10-CM | POA: Diagnosis not present

## 2015-05-31 DIAGNOSIS — R3912 Poor urinary stream: Secondary | ICD-10-CM | POA: Diagnosis not present

## 2015-05-31 DIAGNOSIS — N401 Enlarged prostate with lower urinary tract symptoms: Secondary | ICD-10-CM | POA: Diagnosis not present

## 2015-05-31 DIAGNOSIS — Z87442 Personal history of urinary calculi: Secondary | ICD-10-CM | POA: Diagnosis not present

## 2015-06-02 DIAGNOSIS — H04123 Dry eye syndrome of bilateral lacrimal glands: Secondary | ICD-10-CM | POA: Diagnosis not present

## 2015-06-02 DIAGNOSIS — H2513 Age-related nuclear cataract, bilateral: Secondary | ICD-10-CM | POA: Diagnosis not present

## 2015-06-19 DIAGNOSIS — R3912 Poor urinary stream: Secondary | ICD-10-CM | POA: Diagnosis not present

## 2015-06-19 DIAGNOSIS — N401 Enlarged prostate with lower urinary tract symptoms: Secondary | ICD-10-CM | POA: Diagnosis not present

## 2015-07-06 ENCOUNTER — Encounter (HOSPITAL_COMMUNITY): Payer: Self-pay | Admitting: *Deleted

## 2015-07-07 ENCOUNTER — Other Ambulatory Visit: Payer: Self-pay | Admitting: Urology

## 2015-07-17 ENCOUNTER — Ambulatory Visit (HOSPITAL_COMMUNITY): Payer: Commercial Managed Care - HMO | Admitting: Anesthesiology

## 2015-07-17 ENCOUNTER — Encounter (HOSPITAL_COMMUNITY): Payer: Self-pay | Admitting: *Deleted

## 2015-07-17 ENCOUNTER — Ambulatory Visit (HOSPITAL_COMMUNITY)
Admission: RE | Admit: 2015-07-17 | Discharge: 2015-07-17 | Disposition: A | Discharge status: Home / Self Care | Payer: Commercial Managed Care - HMO | Source: Ambulatory Visit | Attending: Gastroenterology | Admitting: Gastroenterology

## 2015-07-17 ENCOUNTER — Encounter (HOSPITAL_COMMUNITY)
Admission: RE | Disposition: A | Discharge status: Home / Self Care | Payer: Self-pay | Source: Ambulatory Visit | Attending: Gastroenterology

## 2015-07-17 DIAGNOSIS — D123 Benign neoplasm of transverse colon: Secondary | ICD-10-CM | POA: Diagnosis not present

## 2015-07-17 DIAGNOSIS — K5730 Diverticulosis of large intestine without perforation or abscess without bleeding: Secondary | ICD-10-CM | POA: Diagnosis not present

## 2015-07-17 DIAGNOSIS — Z87891 Personal history of nicotine dependence: Secondary | ICD-10-CM | POA: Diagnosis not present

## 2015-07-17 DIAGNOSIS — Z1211 Encounter for screening for malignant neoplasm of colon: Secondary | ICD-10-CM | POA: Diagnosis not present

## 2015-07-17 DIAGNOSIS — N400 Enlarged prostate without lower urinary tract symptoms: Secondary | ICD-10-CM | POA: Insufficient documentation

## 2015-07-17 DIAGNOSIS — K5790 Diverticulosis of intestine, part unspecified, without perforation or abscess without bleeding: Secondary | ICD-10-CM | POA: Diagnosis not present

## 2015-07-17 DIAGNOSIS — E7800 Pure hypercholesterolemia, unspecified: Secondary | ICD-10-CM | POA: Insufficient documentation

## 2015-07-17 DIAGNOSIS — D122 Benign neoplasm of ascending colon: Secondary | ICD-10-CM | POA: Diagnosis not present

## 2015-07-17 DIAGNOSIS — K635 Polyp of colon: Secondary | ICD-10-CM | POA: Diagnosis not present

## 2015-07-17 DIAGNOSIS — Z86010 Personal history of colonic polyps: Secondary | ICD-10-CM | POA: Diagnosis not present

## 2015-07-17 HISTORY — PX: COLONOSCOPY WITH PROPOFOL: SHX5780

## 2015-07-17 SURGERY — COLONOSCOPY WITH PROPOFOL
Anesthesia: Monitor Anesthesia Care

## 2015-07-17 MED ORDER — PROPOFOL 500 MG/50ML IV EMUL
INTRAVENOUS | Status: DC | PRN
  Administered 2015-07-17: 50 mg via INTRAVENOUS

## 2015-07-17 MED ORDER — PROPOFOL 500 MG/50ML IV EMUL
INTRAVENOUS | Status: DC | PRN
  Administered 2015-07-17: 120 ug/kg/min via INTRAVENOUS

## 2015-07-17 MED ORDER — LACTATED RINGERS IV SOLN
INTRAVENOUS | Status: DC
  Administered 2015-07-17: 1000 mL via INTRAVENOUS

## 2015-07-17 MED ORDER — LIDOCAINE HCL (CARDIAC) 20 MG/ML IV SOLN
INTRAVENOUS | Status: AC
  Filled 2015-07-17: qty 5

## 2015-07-17 MED ORDER — SODIUM CHLORIDE 0.9 % IV SOLN: INTRAVENOUS | Status: DC

## 2015-07-17 MED ORDER — PROPOFOL 10 MG/ML IV BOLUS
INTRAVENOUS | Status: AC
  Filled 2015-07-17: qty 40

## 2015-07-17 SURGICAL SUPPLY — 21 items

## 2015-07-17 NOTE — Transfer of Care (Signed)
Immediate Anesthesia Transfer of Care Note  Patient: Brian Levine  Procedure(s) Performed: Procedure(s): COLONOSCOPY WITH PROPOFOL (N/A)  Patient Location: PACU  Anesthesia Type:MAC  Level of Consciousness: awake, alert  and oriented  Airway & Oxygen Therapy: Patient Spontanous Breathing and Patient connected to face mask oxygen  Post-op Assessment: Report given to RN and Post -op Vital signs reviewed and stable  Post vital signs: Reviewed and stable  Last Vitals: There were no vitals filed for this visit.  Complications: No apparent anesthesia complications

## 2015-07-17 NOTE — Anesthesia Preprocedure Evaluation (Addendum)
Anesthesia Evaluation  Patient identified by MRN, date of birth, ID band Patient awake    Reviewed: Allergy & Precautions, NPO status , Patient's Chart, lab work & pertinent test results  Airway Mallampati: II  TM Distance: >3 FB Neck ROM: Full    Dental  (+) Teeth Intact   Pulmonary former smoker,    breath sounds clear to auscultation       Cardiovascular negative cardio ROS   Rhythm:Regular Rate:Normal     Neuro/Psych negative neurological ROS  negative psych ROS   GI/Hepatic negative GI ROS, Neg liver ROS,   Endo/Other  negative endocrine ROS  Renal/GU negative Renal ROS  negative genitourinary   Musculoskeletal negative musculoskeletal ROS (+)   Abdominal   Peds negative pediatric ROS (+)  Hematology   Anesthesia Other Findings   Reproductive/Obstetrics negative OB ROS                           Lab Results  Component Value Date   WBC 3.8* 08/17/2013   HGB 13.9 08/17/2013   HCT 40.7 08/17/2013   MCV 90.6 08/17/2013   PLT 135* 08/17/2013   Lab Results  Component Value Date   CREATININE 1.1 08/17/2013   BUN 12.1 08/17/2013   NA 140 08/17/2013   K 4.5 08/17/2013   CL 103 08/03/2009   CO2 27 08/17/2013   No results found for: INR, PROTIME  Anesthesia Physical Anesthesia Plan  ASA: II  Anesthesia Plan: MAC   Post-op Pain Management:    Induction: Intravenous  Airway Management Planned: Natural Airway  Additional Equipment:   Intra-op Plan:   Post-operative Plan:   Informed Consent: I have reviewed the patients History and Physical, chart, labs and discussed the procedure including the risks, benefits and alternatives for the proposed anesthesia with the patient or authorized representative who has indicated his/her understanding and acceptance.     Plan Discussed with: CRNA  Anesthesia Plan Comments:         Anesthesia Quick Evaluation

## 2015-07-17 NOTE — Anesthesia Postprocedure Evaluation (Signed)
Anesthesia Post Note  Patient: Brian Levine  Procedure(s) Performed: Procedure(s) (LRB): COLONOSCOPY WITH PROPOFOL (N/A)  Patient location during evaluation: PACU Anesthesia Type: MAC Level of consciousness: awake and alert Pain management: pain level controlled Vital Signs Assessment: post-procedure vital signs reviewed and stable Respiratory status: spontaneous breathing, nonlabored ventilation, respiratory function stable and patient connected to nasal cannula oxygen Cardiovascular status: stable and blood pressure returned to baseline Anesthetic complications: no    Last Vitals:  Filed Vitals:   07/17/15 1120 07/17/15 1125  BP:  101/58  Pulse: 50 57  Temp: 36.4 C   Resp: 18 17    Last Pain: There were no vitals filed for this visit.               [name]

## 2015-07-17 NOTE — Op Note (Signed)
Procedure: Surveillance colonoscopy. Adenomatous colon polyps removed colonoscopically in the past  Endoscopist: Earle Gell  Premedication: Propofol administered by anesthesia  Procedure: The patient was placed in the left lateral decubitus position. Anal inspection and digital rectal exam were normal. The Pentax pediatric colonoscope was introduced into the rectum and advanced to the cecum. A normal-appearing ileocecal valve and appendiceal orifice were identified. Colonic preparation for the exam today was good. Withdrawal time was 11 minutes  Rectum. Normal. Retroflexed view of the distal rectum was normal  Sigmoid colon and descending colon. Colonic diverticulosis  Splenic flexure. Normal  Transverse colon. From the proximal transverse colon, a 5 mm sessile polyp was removed with the cold snare  Hepatic flexure. Normal  Ascending colon. From the distal ascending colon, a 5 mm sessile polyp was removed with the cold snare  Cecum and ileocecal valve. Normal  Assessment: A small polyps removed. The proximal transverse colon and a small polyp was removed from the distal ascending colon. Otherwise normal colonoscopy.  Recommendation: I will review the colon polyp pathology to determine if the patient should undergo a repeat surveillance colonoscopy in the future

## 2015-07-17 NOTE — H&P (Signed)
  Procedure: Surveillance colonoscopy. History of adenomatous colon polyps removed colonoscopically in the past.  History: The patient is a 69 year old male born January 04, 1946. He is scheduled to undergo a surveillance colonoscopy today.  Past medical history: Appendectomy. Right hydrocelectomy. Right eye cataract surgery. Spermatocele surgery. Hypercholesterolemia. Benign prostatic hypertrophy.  Exam: The patient is alert and lying comfortably on the endoscopy stretcher. Abdomen is soft and nontender to palpation. Cardiac exam reveals a regular rhythm. Lungs are clear to auscultation.  Plan: Proceed with surveillance colonoscopy

## 2015-07-17 NOTE — Discharge Instructions (Signed)
Monitored Anesthesia Care Monitored anesthesia care is an anesthesia service for a medical procedure. Anesthesia is the loss of the ability to feel pain. It is produced by medicines called anesthetics. It may affect a small area of your body (local anesthesia), a large area of your body (regional anesthesia), or your entire body (general anesthesia). The need for monitored anesthesia care depends your procedure, your condition, and the potential need for regional or general anesthesia. It is often provided during procedures where:   General anesthesia may be needed if there are complications. This is because you need special care when you are under general anesthesia.   You will be under local or regional anesthesia. This is so that you are able to have higher levels of anesthesia if needed.   You will receive calming medicines (sedatives). This is especially the case if sedatives are given to put you in a semi-conscious state of relaxation (deep sedation). This is because the amount of sedative needed to produce this state can be hard to predict. Too much of a sedative can produce general anesthesia. Monitored anesthesia care is performed by one or more health care providers who have special training in all types of anesthesia. You will need to meet with these health care providers before your procedure. During this meeting, they will ask you about your medical history. They will also give you instructions to follow. (For example, you will need to stop eating and drinking before your procedure. You may also need to stop or change medicines you are taking.) During your procedure, your health care providers will stay with you. They will:   Watch your condition. This includes watching your blood pressure, breathing, and level of pain.   Diagnose and treat problems that occur.   Give medicines if they are needed. These may include calming medicines (sedatives) and anesthetics.   Make sure you are  comfortable.  Having monitored anesthesia care does not necessarily mean that you will be under anesthesia. It does mean that your health care providers will be able to manage anesthesia if you need it or if it occurs. It also means that you will be able to have a different type of anesthesia than you are having if you need it. When your procedure is complete, your health care providers will continue to watch your condition. They will make sure any medicines wear off before you are allowed to go home.    This information is not intended to replace advice given to you by your health care provider. Make sure you discuss any questions you have with your health care provider.   Document Released: 04/10/2005 Document Revised: 08/05/2014 Document Reviewed: 08/26/2012 Elsevier Interactive Patient Education 2016 [employer] Colonoscopy, Care After Refer to this sheet in the next few weeks. These instructions provide you with information on caring for yourself after your procedure. Your health care provider may also give you more specific instructions. Your treatment has been planned according to current medical practices, but problems sometimes occur. Call your health care provider if you have any problems or questions after your procedure. WHAT TO EXPECT AFTER THE PROCEDURE  After your procedure, it is typical to have the following:  A small amount of blood in your stool.  Moderate amounts of gas and mild abdominal cramping or bloating. HOME CARE INSTRUCTIONS  Do not drive, operate machinery, or sign important documents for 24 hours.  You may shower and resume your regular physical activities, but move at a slower pace for  the first 24 hours.  Take frequent rest periods for the first 24 hours.  Walk around or put a warm pack on your abdomen to help reduce abdominal cramping and bloating.  Drink enough fluids to keep your urine clear or pale yellow.  You may resume your normal diet as  instructed by your health care provider. Avoid heavy or fried foods that are hard to digest.  Avoid drinking alcohol for 24 hours or as instructed by your health care provider.  Only take over-the-counter or prescription medicines as directed by your health care provider.  If a tissue sample (biopsy) was taken during your procedure:  Do not take aspirin or blood thinners for 7 days, or as instructed by your health care provider.  Do not drink alcohol for 7 days, or as instructed by your health care provider.  Eat soft foods for the first 24 hours. SEEK MEDICAL CARE IF: You have persistent spotting of blood in your stool 2-3 days after the procedure. SEEK IMMEDIATE MEDICAL CARE IF:  You have more than a small spotting of blood in your stool.  You pass large blood clots in your stool.  Your abdomen is swollen (distended).  You have nausea or vomiting.  You have a fever.  You have increasing abdominal pain that is not relieved with medicine.   This information is not intended to replace advice given to you by your health care provider. Make sure you discuss any questions you have with your health care provider.   Document Released: 02/27/2004 Document Revised: 05/05/2013 Document Reviewed: 03/22/2013 Elsevier Interactive Patient Education [employer].

## 2015-07-18 ENCOUNTER — Encounter (HOSPITAL_COMMUNITY): Payer: Self-pay | Admitting: Gastroenterology

## 2015-08-16 NOTE — Patient Instructions (Addendum)
Brian Levine  08/16/2015   Your procedure is scheduled on: 08-22-15  Report to [facility] Main  Entrance take [facility]  elevators to 3rd floor to  [place] at 745 AM.  Call this number if you have problems the morning of surgery [phone]   Remember: ONLY 1 PERSON MAY GO WITH YOU TO SHORT STAY TO GET  READY MORNING OF [place].  Do not eat food or drink liquids :After Midnight.     Take these medicines the morning of surgery with A SIP OF WATER: Finasteride  DO NOT TAKE ANY DIABETIC MEDICATIONS DAY OF YOUR SURGERY                               You may not have any metal on your body including hair pins and              piercings  Do not wear jewelry, make-up, lotions, powders or perfumes, deodorant             Do not wear nail polish.  Do not shave  48 hours prior to surgery.              Men may shave face and neck.   Do not bring valuables to the hospital. [place].  Contacts, dentures or bridgework may not be worn into surgery.  Leave suitcase in the car. After surgery it may be brought to your room.     Patients discharged the day of surgery will not be allowed to drive home.  Name and phone number of your driver:  Special Instructions: N/A              Please read over the following fact sheets you were given: _____________________________________________________________________             [facility] - Preparing for Surgery Before surgery, you can play an important role.  Because skin is not sterile, your skin needs to be as free of germs as possible.  You can reduce the number of germs on your skin by washing with CHG (chlorahexidine gluconate) soap before surgery.  CHG is an antiseptic cleaner which kills germs and bonds with the skin to continue killing germs even after washing. Please DO NOT use if you have an allergy to CHG or antibacterial soaps.  If your skin becomes  reddened/irritated stop using the CHG and inform your nurse when you arrive at Short Stay. Do not shave (including legs and underarms) for at least 48 hours prior to the first CHG shower.  You may shave your face/neck. Please follow these instructions carefully:  1.  Shower with CHG Soap the night before surgery and the  morning of Surgery.  2.  If you choose to wash your hair, wash your hair first as usual with your  normal  shampoo.  3.  After you shampoo, rinse your hair and body thoroughly to remove the  shampoo.                           4.  Use CHG as you would any other liquid soap.  You can apply chg directly  to the skin and wash  Gently with a scrungie or clean washcloth.  5.  Apply the CHG Soap to your body ONLY FROM THE NECK DOWN.   Do not use on face/ open                           Wound or open sores. Avoid contact with eyes, ears mouth and genitals (private parts).                       Wash face,  Genitals (private parts) with your normal soap.             6.  Wash thoroughly, paying special attention to the area where your surgery  will be performed.  7.  Thoroughly rinse your body with warm water from the neck down.  8.  DO NOT shower/wash with your normal soap after using and rinsing off  the CHG Soap.                9.  Pat yourself dry with a clean towel.            10.  Wear clean pajamas.            11.  Place clean sheets on your bed the night of your first shower and do not  sleep with pets. Day of Surgery : Do not apply any lotions/deodorants the morning of surgery.  Please wear clean clothes to the hospital/surgery center.  FAILURE TO FOLLOW THESE INSTRUCTIONS MAY RESULT IN THE CANCELLATION OF YOUR SURGERY PATIENT SIGNATURE_________________________________  NURSE SIGNATURE__________________________________  ________________________________________________________________________

## 2015-08-18 ENCOUNTER — Encounter (HOSPITAL_COMMUNITY)
Admission: RE | Admit: 2015-08-18 | Discharge: 2015-08-18 | Disposition: A | Discharge status: Home / Self Care | Payer: Commercial Managed Care - HMO | Source: Ambulatory Visit | Attending: Urology | Admitting: Urology

## 2015-08-18 ENCOUNTER — Encounter (HOSPITAL_COMMUNITY): Payer: Self-pay

## 2015-08-18 DIAGNOSIS — N138 Other obstructive and reflux uropathy: Secondary | ICD-10-CM | POA: Diagnosis not present

## 2015-08-18 DIAGNOSIS — Z01812 Encounter for preprocedural laboratory examination: Secondary | ICD-10-CM | POA: Insufficient documentation

## 2015-08-18 DIAGNOSIS — N401 Enlarged prostate with lower urinary tract symptoms: Secondary | ICD-10-CM | POA: Insufficient documentation

## 2015-08-18 LAB — CBC
HCT: 37 % — ABNORMAL LOW (ref 39.0–52.0)
Hemoglobin: 12.9 g/dL — ABNORMAL LOW (ref 13.0–17.0)
MCH: 30.5 pg (ref 26.0–34.0)
MCHC: 34.9 g/dL (ref 30.0–36.0)
MCV: 87.5 fL (ref 78.0–100.0)
Platelets: 129 10*3/uL — ABNORMAL LOW (ref 150–400)
RBC: 4.23 MIL/uL (ref 4.22–5.81)
RDW: 12.7 % (ref 11.5–15.5)
WBC: 4.4 10*3/uL (ref 4.0–10.5)

## 2015-08-18 NOTE — Progress Notes (Signed)
02/02/2015-DG chest 2 view from [facility] on chart. 04/10/2005-Office visit note from [facility] from Dr. Lavone Orn on chart 03/25/2011-EKG from [facility] on chart.

## 2015-08-21 NOTE — H&P (Signed)
Active Problems Problems  1. Benign prostatic hyperplasia with urinary obstruction (N40.1,N13.8) 2. 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 History of kidney stones (Z87.442) 4. 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 Weak urinary stream (R39.12)  History of Present Illness Brian Levine returns today in f/u for further evaluation of his voiding symptoms   Past Medical History Problems  1. History of kidney stones [id number]  Surgical History Problems  1. History of Appendectomy 2. History of Surgery Excision Of Spermatocele With Epididymectomy Right 3. History of Surgery Spermatic Cord Excision Of Hydrocele Right  Current Meds 1. Aspirin 81 MG TABS;  Therapy: (Recorded:08Aug2013) to Recorded 2. Doxazosin Mesylate TABS;  Therapy: (Recorded:08Aug2013) to Recorded 3. Finasteride TABS;  Therapy: (Recorded:06Dec2007) to Recorded 4. Vitamin D TABS;  Therapy: (Recorded:02Dec2013) to Recorded  Allergies Medication  1. No Known Drug Allergies  Family History Problems  1. Family history of Acute Myocardial Infarction : Father 2. Family history of Death In The Family Father   60- MI 3. Family history of Death In The Family Mother   22- 78. Family history of Family Health Status Number Of Children   1 daughter 5. Family history of Kidney Cancer  Social History Problems  1. Denied: Alcohol Use (History) 2. Caffeine Use   1 3. 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    Physical Exam Constitutional: Well nourished and well developed . No acute distress.  Pulmonary: No respiratory distress and normal respiratory rhythm and effort.  Cardiovascular: Heart rate and rhythm are normal . No peripheral edema.    Results/Data Urine [Data Includes: Last 1 Day]   [id number]  COLOR YELLOW   APPEARANCE CLEAR   SPECIFIC GRAVITY 1.010   pH 7.0   GLUCOSE NEGATIVE   BILIRUBIN NEGATIVE   KETONE NEGATIVE   BLOOD NEGATIVE    PROTEIN NEGATIVE   NITRITE NEGATIVE   LEUKOCYTE ESTERASE NEGATIVE    Flow Rate: Voided 396 ml. A peak flow rate of 62ml/s and mean flow rate of 92ml/s . True peak was closer to 10 ml/sec.  PVR: Ultrasound PVR 13 ml.    Procedure  Procedure: Cystoscopy   Indication: Lower Urinary Tract Symptoms.  Informed Consent: Risks, benefits, and potential adverse events were discussed and informed consent was obtained from the patient.  Prep: The patient was prepped with betadine.  Antibiotic prophylaxis: Ciprofloxacin.  Procedure Note:  Urethral meatus:. No abnormalities.  Anterior urethra: No abnormalities.  Prostatic urethra: No abnormalities . Estimated length was 4 cm. There was visual obstruction of the prostatic urethra. The lateral and median prostatic lobes were enlarged. An enlarged intravesical median lobe was visualized. (very small and not ball valving).  Bladder: Visulization was clear. The ureteral orifices were in the normal anatomic position bilaterally and had clear efflux of urine. A systematic survey of the bladder demonstrated no bladder tumors or stones. The mucosa was smooth without abnormalities. Examination of the bladder demonstrated mild trabeculation. The patient tolerated the procedure well.  Complications: None.   Procedure: Transrectal ultrasound of the prostate.  Local Anesthesia:  Antibiotic prophylaxis:  Procedure Note: The 10 MHz transrectal ultrasound probe was placed into the rectum. Prostate width measured 5.58cm, height of 3.81cm and length of 4.44cm . Prostate volume was measured to be 49.47 cc. Calcifications were noted. (central in the TZ) . The seminal vesicles appeared normal. No median lobe was present.  Patient Status:. The patient tolerated the procedure well.  Complications:. None.  Assessment Assessed  1. Benign prostatic hyperplasia with urinary obstruction (N40.1,N13.8) 2. Weak urinary stream (R39.12)  He has BPH with BOO and a reduced  stream.   Plan Benign prostatic hyperplasia with urinary obstruction  1. Follow-up Schedule Surgery Office  Follow-up  Status: Hold For - Appointment   Requested for: 21Nov2016 Health Maintenance  2. UA With REFLEX; [Do Not Release]; Status:Resulted - Requires Verification;   DoneEC:[id number] 11:37AM  I discussed the options for therapy including urolift, CTT, Greenlight and TURP and reviewed teh risks in detail. He is interested in Urolift and I believe his anatomy is favorable. He has a very small middle lobe and a 65ml prostate with primarily lateral lobe obstruction.    I reviewed the risks of the Urolift including bleeding, infection, persistent voiding symptoms, need for secondary procedures, prolonged irritative symptoms, ejaculatory dysfunction and anesthetic complications.  We will get him set up in the near future.

## 2015-08-22 ENCOUNTER — Encounter (HOSPITAL_COMMUNITY): Payer: Self-pay | Admitting: *Deleted

## 2015-08-22 ENCOUNTER — Ambulatory Visit (HOSPITAL_COMMUNITY): Payer: Commercial Managed Care - HMO | Admitting: Registered Nurse

## 2015-08-22 ENCOUNTER — Encounter (HOSPITAL_COMMUNITY)
Admission: RE | Disposition: A | Discharge status: Home / Self Care | Payer: Self-pay | Source: Ambulatory Visit | Attending: Urology

## 2015-08-22 ENCOUNTER — Ambulatory Visit (HOSPITAL_COMMUNITY)
Admission: RE | Admit: 2015-08-22 | Discharge: 2015-08-22 | Disposition: A | Discharge status: Home / Self Care | Payer: Commercial Managed Care - HMO | Source: Ambulatory Visit | Attending: Urology | Admitting: Urology

## 2015-08-22 DIAGNOSIS — N138 Other obstructive and reflux uropathy: Secondary | ICD-10-CM | POA: Diagnosis not present

## 2015-08-22 DIAGNOSIS — Z79899 Other long term (current) drug therapy: Secondary | ICD-10-CM | POA: Insufficient documentation

## 2015-08-22 DIAGNOSIS — N401 Enlarged prostate with lower urinary tract symptoms: Secondary | ICD-10-CM | POA: Insufficient documentation

## 2015-08-22 DIAGNOSIS — Z7982 Long term (current) use of aspirin: Secondary | ICD-10-CM | POA: Diagnosis not present

## 2015-08-22 DIAGNOSIS — R3912 Poor urinary stream: Secondary | ICD-10-CM | POA: Diagnosis not present

## 2015-08-22 DIAGNOSIS — N400 Enlarged prostate without lower urinary tract symptoms: Secondary | ICD-10-CM | POA: Diagnosis not present

## 2015-08-22 DIAGNOSIS — Z87891 Personal history of nicotine dependence: Secondary | ICD-10-CM | POA: Insufficient documentation

## 2015-08-22 HISTORY — PX: CYSTOSCOPY WITH INSERTION OF UROLIFT: SHX6678

## 2015-08-22 SURGERY — CYSTOSCOPY WITH INSERTION OF UROLIFT
Anesthesia: General

## 2015-08-22 MED ORDER — FENTANYL CITRATE (PF) 100 MCG/2ML IJ SOLN
INTRAMUSCULAR | Status: DC | PRN
  Administered 2015-08-22: 50 ug via INTRAVENOUS

## 2015-08-22 MED ORDER — FENTANYL CITRATE (PF) 100 MCG/2ML IJ SOLN: 25.0000 ug | INTRAMUSCULAR | Status: DC | PRN

## 2015-08-22 MED ORDER — PHENYLEPHRINE HCL 10 MG/ML IJ SOLN
INTRAMUSCULAR | Status: DC | PRN
  Administered 2015-08-22 (×2): 120 ug via INTRAVENOUS

## 2015-08-22 MED ORDER — DEXAMETHASONE SODIUM PHOSPHATE 10 MG/ML IJ SOLN
INTRAMUSCULAR | Status: DC | PRN
  Administered 2015-08-22: 10 mg via INTRAVENOUS

## 2015-08-22 MED ORDER — SODIUM CHLORIDE 0.9 % IJ SOLN: 3.0000 mL | INTRAMUSCULAR | Status: DC | PRN

## 2015-08-22 MED ORDER — PROPOFOL 10 MG/ML IV BOLUS
INTRAVENOUS | Status: AC
  Filled 2015-08-22: qty 20

## 2015-08-22 MED ORDER — SODIUM CHLORIDE 0.9 % IV SOLN: 250.0000 mL | INTRAVENOUS | Status: DC | PRN

## 2015-08-22 MED ORDER — LACTATED RINGERS IV SOLN: INTRAVENOUS | Status: DC

## 2015-08-22 MED ORDER — MIDAZOLAM HCL 2 MG/2ML IJ SOLN
INTRAMUSCULAR | Status: AC
  Filled 2015-08-22: qty 2

## 2015-08-22 MED ORDER — OXYCODONE HCL 5 MG PO TABS: 5.0000 mg | ORAL_TABLET | ORAL | Status: DC | PRN

## 2015-08-22 MED ORDER — LIDOCAINE HCL (CARDIAC) 20 MG/ML IV SOLN
INTRAVENOUS | Status: AC
  Filled 2015-08-22: qty 5

## 2015-08-22 MED ORDER — LIDOCAINE HCL (CARDIAC) 10 MG/ML IV SOLN
INTRAVENOUS | Status: DC | PRN
Start: 2015-08-22 — End: 2015-08-22
  Administered 2015-08-22: 100 mg via INTRAVENOUS

## 2015-08-22 MED ORDER — PROPOFOL 10 MG/ML IV BOLUS
INTRAVENOUS | Status: DC | PRN
  Administered 2015-08-22: 200 mg via INTRAVENOUS

## 2015-08-22 MED ORDER — ACETAMINOPHEN 650 MG RE SUPP
650.0000 mg | RECTAL | Status: DC | PRN
  Filled 2015-08-22: qty 1

## 2015-08-22 MED ORDER — CIPROFLOXACIN IN D5W 400 MG/200ML IV SOLN
400.0000 mg | INTRAVENOUS | Status: AC
  Administered 2015-08-22: 400 mg via INTRAVENOUS

## 2015-08-22 MED ORDER — GLYCOPYRROLATE 0.2 MG/ML IJ SOLN
INTRAMUSCULAR | Status: DC | PRN
  Administered 2015-08-22: 0.2 mg via INTRAVENOUS

## 2015-08-22 MED ORDER — MIDAZOLAM HCL 5 MG/5ML IJ SOLN
INTRAMUSCULAR | Status: DC | PRN
  Administered 2015-08-22: 2 mg via INTRAVENOUS

## 2015-08-22 MED ORDER — ONDANSETRON HCL 4 MG/2ML IJ SOLN
INTRAMUSCULAR | Status: DC | PRN
  Administered 2015-08-22: 4 mg via INTRAVENOUS

## 2015-08-22 MED ORDER — CIPROFLOXACIN IN D5W 400 MG/200ML IV SOLN
INTRAVENOUS | Status: AC
  Filled 2015-08-22: qty 200

## 2015-08-22 MED ORDER — DEXAMETHASONE SODIUM PHOSPHATE 10 MG/ML IJ SOLN
INTRAMUSCULAR | Status: AC
  Filled 2015-08-22: qty 1

## 2015-08-22 MED ORDER — LACTATED RINGERS IV SOLN
INTRAVENOUS | Status: DC
  Administered 2015-08-22: 1000 mL via INTRAVENOUS
  Administered 2015-08-22: 10:00:00 via INTRAVENOUS

## 2015-08-22 MED ORDER — STERILE WATER FOR IRRIGATION IR SOLN
Status: DC | PRN
  Administered 2015-08-22: 3000 mL

## 2015-08-22 MED ORDER — TRAMADOL HCL 50 MG PO TABS: 50.0000 mg | ORAL_TABLET | Freq: Four times a day (QID) | ORAL | Status: DC | PRN

## 2015-08-22 MED ORDER — ACETAMINOPHEN 325 MG PO TABS: 650.0000 mg | ORAL_TABLET | ORAL | Status: DC | PRN

## 2015-08-22 MED ORDER — CIPROFLOXACIN HCL 500 MG PO TABS: 500.0000 mg | ORAL_TABLET | Freq: Two times a day (BID) | ORAL | Status: DC

## 2015-08-22 MED ORDER — SODIUM CHLORIDE 0.9 % IJ SOLN: 3.0000 mL | Freq: Two times a day (BID) | INTRAMUSCULAR | Status: DC

## 2015-08-22 MED ORDER — ONDANSETRON HCL 4 MG/2ML IJ SOLN
INTRAMUSCULAR | Status: AC
  Filled 2015-08-22: qty 2

## 2015-08-22 MED ORDER — FENTANYL CITRATE (PF) 250 MCG/5ML IJ SOLN
INTRAMUSCULAR | Status: AC
  Filled 2015-08-22: qty 5

## 2015-08-22 SURGICAL SUPPLY — 8 items
BAG URO CATCHER STRL LF (MISCELLANEOUS) ×2 IMPLANT
CLOTH BEACON ORANGE TIMEOUT ST (SAFETY) ×2 IMPLANT
GLOVE SURG SS PI 8.0 STRL IVOR (GLOVE) ×2 IMPLANT
GOWN STRL REUS W/TWL XL LVL3 (GOWN DISPOSABLE) ×2 IMPLANT
MANIFOLD NEPTUNE II (INSTRUMENTS) ×2 IMPLANT
PACK CYSTO (CUSTOM PROCEDURE TRAY) ×2 IMPLANT
SYSTEM UROLIFT (Male Continence) ×10 IMPLANT
TUBING CONNECTING 10 (TUBING) ×2 IMPLANT

## 2015-08-22 NOTE — Discharge Instructions (Signed)
CYSTOSCOPY HOME CARE INSTRUCTIONS  Activity: Rest for the remainder of the day.  Do not drive or operate equipment today.  You may resume normal activities in one to two days as instructed by your physician.   Meals: Drink plenty of liquids and eat light foods such as gelatin or soup this evening.  You may return to a normal meal plan tomorrow.  Return to Work: You may return to work in one to two days or as instructed by your physician.  Special Instructions / Symptoms: Call your physician if any of these symptoms occur:   -persistent or heavy bleeding  -bleeding which continues after first few urination  -large blood clots that are difficult to pass  -urine stream diminishes or stops completely  -fever equal to or higher than 101 degrees Farenheit.  -cloudy urine with a strong, foul odor  -severe pain  Females should always wipe from front to back after elimination.  You may feel some burning pain when you urinate.  This should disappear with time.  Applying moist heat to the lower abdomen or a hot tub bath may help relieve the pain. \  I have written to stop the doxazosin and finasteride, but you may use the doxazosin if you feel you need it in the immediate postoperative period.   Patient Signature:  ________________________________________________________  Nurse's Signature:  ________________________________________________________

## 2015-08-22 NOTE — Anesthesia Procedure Notes (Signed)
Procedure Name: LMA Insertion Date/Time: 08/22/2015 9:57 AM Performed by: Lissa Morales Pre-anesthesia Checklist: Patient identified, Emergency Drugs available, Suction available and Patient being monitored Patient Re-evaluated:Patient Re-evaluated prior to inductionOxygen Delivery Method: Circle System Utilized Preoxygenation: Pre-oxygenation with 100% oxygen Intubation Type: IV induction Ventilation: Mask ventilation without difficulty LMA: LMA with gastric port inserted LMA Size: 5.0 Tube type: Oral Number of attempts: 1 Airway Equipment and Method: Oral airway Placement Confirmation: positive ETCO2 and breath sounds checked- equal and bilateral Tube secured with: Tape Dental Injury: Teeth and Oropharynx as per pre-operative assessment

## 2015-08-22 NOTE — Interval H&P Note (Signed)
History and Physical Interval Note:  08/22/2015 9:34 AM  Brian Levine  has presented today for surgery, with the diagnosis of BENIGN PROSTATIC HYPERPLASIA  The various methods of treatment have been discussed with the patient and family. After consideration of risks, benefits and other options for treatment, the patient has consented to  Procedure(s): CYSTOSCOPY WITH INSERTION OF UROLIFT (N/A) as a surgical intervention .  The patient's history has been reviewed, patient examined, no change in status, stable for surgery.  I have reviewed the patient's chart and labs.  Questions were answered to the patient's satisfaction.     WRENN,[name]

## 2015-08-22 NOTE — Anesthesia Postprocedure Evaluation (Signed)
Anesthesia Post Note  Patient: Brian Levine  Procedure(s) Performed: Procedure(s) (LRB): CYSTOSCOPY WITH INSERTION OF UROLIFT (N/A)  Patient location during evaluation: PACU Anesthesia Type: General Level of consciousness: awake and alert Pain management: pain level controlled Vital Signs Assessment: post-procedure vital signs reviewed and stable Respiratory status: spontaneous breathing, nonlabored ventilation, respiratory function stable and patient connected to nasal cannula oxygen Cardiovascular status: blood pressure returned to baseline and stable Postop Assessment: no signs of nausea or vomiting Anesthetic complications: no    Last Vitals:  Filed Vitals:   08/22/15 1115 08/22/15 1130  BP: 116/55 129/67  Pulse: 55 48  Temp: 36.4 C   Resp: 15 12    Last Pain:  Filed Vitals:   08/22/15 1132  PainSc: 0-No pain                 EWELL,CHARLES L

## 2015-08-22 NOTE — Brief Op Note (Signed)
08/22/2015  10:33 AM  PATIENT:  [name]  70 y.o. male  PRE-OPERATIVE DIAGNOSIS:  BENIGN PROSTATIC HYPERPLASIA  POST-OPERATIVE DIAGNOSIS:  BENIGN PROSTATIC HYPERPLASIA  PROCEDURE:  Procedure(s): CYSTOSCOPY WITH INSERTION OF UROLIFT (N/A)  SURGEON:  Surgeon(s) and Role:    * Irine Seal, MD - Primary  PHYSICIAN ASSISTANT:   ASSISTANTS: none   ANESTHESIA:   general  EBL:     BLOOD ADMINISTERED:none  DRAINS: none   LOCAL MEDICATIONS USED:  NONE  SPECIMEN:  No Specimen  DISPOSITION OF SPECIMEN:  N/A  COUNTS:  YES  TOURNIQUET:  * No tourniquets in log *  DICTATION: .Other Dictation: Dictation Number [id number]  PLAN OF CARE: Discharge to home after PACU  PATIENT DISPOSITION:  PACU - hemodynamically stable.   Delay start of Pharmacological VTE agent (>24hrs) due to surgical blood loss or risk of bleeding: not applicable

## 2015-08-22 NOTE — Op Note (Signed)
NAMELONNELL, BEA NO.:  [id number]  MEDICAL RECORD NO.:  [id number]  LOCATION:  [facility]                         FACILITY:  [facility]  PHYSICIAN:  Marshall Cork. Jeffie Pollock, M.D.    DATE OF BIRTH:  Sep 27, 1945  DATE OF PROCEDURE:  08/22/2015 DATE OF DISCHARGE:                              OPERATIVE REPORT   PATIENT OF:  Marshall Cork. Jeffie Pollock, M.D.  PROCEDURE:  Cystoscopy with insertion of UroLift devices.  PREOPERATIVE DIAGNOSIS:  Benign prostatic hypertrophy with bladder outlet obstruction.  POSTOPERATIVE DIAGNOSIS:  Benign prostatic hypertrophy with bladder outlet obstruction.  SURGEON:  Marshall Cork. Jeffie Pollock, M.D.  ANESTHESIA:  General.  SPECIMEN:  None.  DRAINS:  None.  BLOOD LOSS:  None.  COMPLICATIONS:  None.  INDICATIONS:  [name] is a 70 year old, white male, with his BPH with bladder outlet obstruction who has selected UroLift for symptomatic relief.  FINDINGS OF PROCEDURE:  He was taken to operating room where a general anesthetic was induced.  He was given Cipro.  He was placed in lithotomy position.  He was fitted with PAS hose.  His perineum and genitalia were prepped with Betadine solution.  He was draped in usual sterile fashion.  Cystoscopy was performed using the UroLift sheath and 30-degree lens. Examination revealed a normal urethra.  The external sphincter was intact.  The prostatic urethra was approximately 4 cm in length with primarily bilobar hyperplasia with obstruction.  There was a small broad based middle lobe, but it was nonobstructing. Examination of bladder revealed moderate trabeculation.  No tumors or stones were noted. Ureteral orifices were in their normal anatomic position.  Once thorough inspection was performed.  The obturator was removed and was replaced with the UroLift device.  This was initially passed into the bladder and then brought back approximately to a cm and a half proximal to the bladder neck at the 3 o'clock location in the  left lateral lobe.  Approximately 20 degrees of compression was applied and the device was fired and released.  Good tacking of the prostatic lobe was noted.  A 2nd device was then inserted and applied to the right proximal prostatic urethra in an identical fashion.  At this point a right apical pack was placed.  The device was brought out to the veru and then placed in the apex of the lateral lobe with 20 degrees compression and fired and then released.  A 4th device was placed at the right lateral apex followed by a fifth device in the midright lateral lobe to improve the anterior channel.  Once all 5 devices were in place inspection revealed a clear visualization of the bladder from the prostatic apex, there was minimal bleeding.  The bladder was partially drained and the scope was removed. It was not felt that a Foley catheter was indicated at this point.  The patient was taken down from lithotomy position.  His anesthetic was reversed.  He was moved to recovery room in stable condition.  There were no complications.  The CPT codes for this procedure are 52441x1 and 52442 x4.     Marshall Cork. Jeffie Pollock, M.D.     JJW/MEDQ  D:  08/22/2015  T:  08/22/2015  Job:  [id number]

## 2015-08-22 NOTE — Anesthesia Preprocedure Evaluation (Addendum)
Anesthesia Evaluation  Patient identified by MRN, date of birth, ID band Patient awake    Reviewed: Allergy & Precautions, NPO status , Patient's Chart, lab work & pertinent test results  Airway Mallampati: II  TM Distance: >3 FB Neck ROM: Full    Dental  (+) Dental Advisory Given, Missing A few missing right lower front teeth:   Pulmonary former smoker,    Pulmonary exam normal breath sounds clear to auscultation       Cardiovascular negative cardio ROS Normal cardiovascular exam Rhythm:Regular Rate:Normal     Neuro/Psych negative neurological ROS  negative psych ROS   GI/Hepatic negative GI ROS, Neg liver ROS,   Endo/Other  negative endocrine ROS  Renal/GU negative Renal ROS  negative genitourinary   Musculoskeletal negative musculoskeletal ROS (+)   Abdominal   Peds negative pediatric ROS (+)  Hematology pancytopenia   Anesthesia Other Findings   Reproductive/Obstetrics negative OB ROS                            Anesthesia Physical Anesthesia Plan  ASA: II  Anesthesia Plan: General   Post-op Pain Management:    Induction: Intravenous  Airway Management Planned: LMA  Additional Equipment:   Intra-op Plan:   Post-operative Plan:   Informed Consent:   Plan Discussed with: Surgeon  Anesthesia Plan Comments:        Anesthesia Quick Evaluation

## 2015-08-22 NOTE — Transfer of Care (Signed)
Immediate Anesthesia Transfer of Care Note  Patient: Brian Levine  Procedure(s) Performed: Procedure(s): CYSTOSCOPY WITH INSERTION OF UROLIFT (N/A)  Patient Location: PACU  Anesthesia Type:General  Level of Consciousness: awake, alert , oriented and patient cooperative  Airway & Oxygen Therapy: Patient Spontanous Breathing and Patient connected to face mask oxygen  Post-op Assessment: Report given to RN, Post -op Vital signs reviewed and stable and Patient moving all extremities X 4  Post vital signs: stable  Last Vitals:  Filed Vitals:   08/22/15 0732 08/22/15 1043  BP: 134/73   Pulse: 59   Temp: 36.3 C 36.4 C  Resp: 18     Complications: No apparent anesthesia complications

## 2015-08-30 DIAGNOSIS — R3912 Poor urinary stream: Secondary | ICD-10-CM | POA: Diagnosis not present

## 2015-08-30 DIAGNOSIS — Z0000 Encounter for general adult medical examination without abnormal findings: Secondary | ICD-10-CM | POA: Diagnosis not present

## 2015-10-20 DIAGNOSIS — J209 Acute bronchitis, unspecified: Secondary | ICD-10-CM | POA: Diagnosis not present

## 2015-12-21 DIAGNOSIS — N401 Enlarged prostate with lower urinary tract symptoms: Secondary | ICD-10-CM | POA: Diagnosis not present

## 2015-12-21 DIAGNOSIS — N138 Other obstructive and reflux uropathy: Secondary | ICD-10-CM | POA: Diagnosis not present

## 2015-12-21 DIAGNOSIS — Z0000 Encounter for general adult medical examination without abnormal findings: Secondary | ICD-10-CM | POA: Diagnosis not present

## 2015-12-21 DIAGNOSIS — R3912 Poor urinary stream: Secondary | ICD-10-CM | POA: Diagnosis not present

## 2016-04-10 DIAGNOSIS — N400 Enlarged prostate without lower urinary tract symptoms: Secondary | ICD-10-CM | POA: Diagnosis not present

## 2016-04-10 DIAGNOSIS — Z0000 Encounter for general adult medical examination without abnormal findings: Secondary | ICD-10-CM | POA: Diagnosis not present

## 2016-04-10 DIAGNOSIS — Z131 Encounter for screening for diabetes mellitus: Secondary | ICD-10-CM | POA: Diagnosis not present

## 2016-04-10 DIAGNOSIS — E7800 Pure hypercholesterolemia, unspecified: Secondary | ICD-10-CM | POA: Diagnosis not present

## 2016-04-10 DIAGNOSIS — R030 Elevated blood-pressure reading, without diagnosis of hypertension: Secondary | ICD-10-CM | POA: Diagnosis not present

## 2016-04-10 DIAGNOSIS — D696 Thrombocytopenia, unspecified: Secondary | ICD-10-CM | POA: Diagnosis not present

## 2016-04-10 DIAGNOSIS — Z1389 Encounter for screening for other disorder: Secondary | ICD-10-CM | POA: Diagnosis not present

## 2016-05-23 DIAGNOSIS — Z23 Encounter for immunization: Secondary | ICD-10-CM | POA: Diagnosis not present

## 2016-06-06 DIAGNOSIS — H2512 Age-related nuclear cataract, left eye: Secondary | ICD-10-CM | POA: Diagnosis not present

## 2016-06-06 DIAGNOSIS — Z0100 Encounter for examination of eyes and vision without abnormal findings: Secondary | ICD-10-CM | POA: Diagnosis not present

## 2016-07-17 DIAGNOSIS — H2512 Age-related nuclear cataract, left eye: Secondary | ICD-10-CM | POA: Diagnosis not present

## 2016-07-17 DIAGNOSIS — H25812 Combined forms of age-related cataract, left eye: Secondary | ICD-10-CM | POA: Diagnosis not present

## 2016-09-24 DIAGNOSIS — Z961 Presence of intraocular lens: Secondary | ICD-10-CM | POA: Diagnosis not present

## 2016-10-16 DIAGNOSIS — M79622 Pain in left upper arm: Secondary | ICD-10-CM | POA: Diagnosis not present

## 2017-02-18 DIAGNOSIS — H524 Presbyopia: Secondary | ICD-10-CM | POA: Diagnosis not present

## 2017-02-18 DIAGNOSIS — Z961 Presence of intraocular lens: Secondary | ICD-10-CM | POA: Diagnosis not present

## 2017-04-15 DIAGNOSIS — Z7189 Other specified counseling: Secondary | ICD-10-CM | POA: Diagnosis not present

## 2017-04-15 DIAGNOSIS — Z0000 Encounter for general adult medical examination without abnormal findings: Secondary | ICD-10-CM | POA: Diagnosis not present

## 2017-04-15 DIAGNOSIS — Z1389 Encounter for screening for other disorder: Secondary | ICD-10-CM | POA: Diagnosis not present

## 2017-04-15 DIAGNOSIS — Z125 Encounter for screening for malignant neoplasm of prostate: Secondary | ICD-10-CM | POA: Diagnosis not present

## 2017-04-15 DIAGNOSIS — D696 Thrombocytopenia, unspecified: Secondary | ICD-10-CM | POA: Diagnosis not present

## 2017-04-15 DIAGNOSIS — N400 Enlarged prostate without lower urinary tract symptoms: Secondary | ICD-10-CM | POA: Diagnosis not present

## 2017-04-15 DIAGNOSIS — E7800 Pure hypercholesterolemia, unspecified: Secondary | ICD-10-CM | POA: Diagnosis not present

## 2017-04-15 DIAGNOSIS — Z1159 Encounter for screening for other viral diseases: Secondary | ICD-10-CM | POA: Diagnosis not present

## 2017-04-15 DIAGNOSIS — Z23 Encounter for immunization: Secondary | ICD-10-CM | POA: Diagnosis not present

## 2017-08-22 ENCOUNTER — Ambulatory Visit: Payer: Medicare HMO | Admitting: Podiatry

## 2017-08-22 ENCOUNTER — Encounter: Payer: Self-pay | Admitting: Podiatry

## 2017-08-22 VITALS — BP 139/80 | HR 63 | Ht 71.0 in | Wt 180.0 lb

## 2017-08-22 DIAGNOSIS — L600 Ingrowing nail: Secondary | ICD-10-CM | POA: Diagnosis not present

## 2017-08-22 NOTE — Progress Notes (Signed)
Subjective:  Patient ID: Brian Levine, male    DOB: Dec 18, 1945,  MRN: [id number]  Chief Complaint  Patient presents with  . Ingrown Toenail    (NP) big toe sore- ingrown- red    72 y.o. male presents with and ingrown toenail to the L great toe.  Reports pain and redness from the toe.  Endorses some drainage.  Past Medical History:  Diagnosis Date  . BPH (benign prostatic hyperplasia) 2004  . Constipation   . Other pancytopenia ([place]) 05/04/2013  . Thrombocytopenia, unspecified ([place]) 08/17/2013  . Weight loss 05/04/2013   Past Surgical History:  Procedure Laterality Date  . APPENDECTOMY    . COLONOSCOPY WITH PROPOFOL N/A 07/17/2015   Procedure: COLONOSCOPY WITH PROPOFOL;  Surgeon: Garlan Fair, MD;  Location: [facility];  Service: Endoscopy;  Laterality: N/A;  . CYSTOSCOPY WITH INSERTION OF UROLIFT N/A 08/22/2015   Procedure: CYSTOSCOPY WITH INSERTION OF UROLIFT;  Surgeon: Irine Seal, MD;  Location: [facility];  Service: Urology;  Laterality: N/A;  . EYE SURGERY     right cataract surgery with lens implant  . TESTICLE SURGERY Right 2011   hydrocele    Current Outpatient Medications:  .  aspirin EC 81 MG tablet, Take 81 mg by mouth daily., Disp: , Rfl:  .  cholecalciferol (VITAMIN D) 1000 UNITS tablet, Take 1,000 Units by mouth daily., Disp: , Rfl:  .  ciprofloxacin (CIPRO) 500 MG tablet, Take 1 tablet (500 mg total) by mouth 2 (two) times daily. (Patient not taking: Reported on 08/22/2017), Disp: 6 tablet, Rfl: 0 .  doxazosin (CARDURA) 8 MG tablet, , Disp: , Rfl:  .  pravastatin (PRAVACHOL) 40 MG tablet, Take 40 mg by mouth daily at 6 PM. , Disp: , Rfl:  .  traMADol (ULTRAM) 50 MG tablet, Take 1 tablet (50 mg total) by mouth every 6 (six) hours as needed., Disp: 12 tablet, Rfl: 0  No Known Allergies Review of Systems Objective:   Vitals:   08/22/17 0832  BP: 139/80  Pulse: 63   General AA&O x3. Normal mood and affect.  Vascular Dorsalis pedis and posterior tibial pulses   present 2+ bilaterally  Capillary refill normal to all digits. Pedal hair growth normal.  Neurologic Epicritic sensation grossly present.  Dermatologic No open lesions. Interspaces clear of maceration. Nails well groomed and normal in appearance. Painful ingrowing nail at the hallux nail left.  Orthopedic: MMT 5/5 in dorsiflexion, plantarflexion, inversion, and eversion. Normal joint ROM without pain or crepitus. Pain to palpation about the ingrown nail.   Assessment & Plan:  Patient was evaluated and treated and all questions answered.  Ingrown Nail, left -Patient elects to proceed with ingrown toenail removal today -Ingrown nail excised. See procedure note. -Educated on post-procedure care including soaking. Written instructions provided. -Patient to follow up in 2 weeks for nail check.  Procedure: Excision of Ingrown Toenail Location: Left 1st toe Anesthesia: Lidocaine 1% plain; 1.5 mL and Marcaine 0.5% plain; 1.5 mL, digital block. Skin Prep: Betadine. Dressing: Silvadene; telfa; dry, sterile, compression dressing. Technique: Following skin prep, the toe was exsanguinated and a tourniquet was secured at the base of the toe. The affected nail border was freed, split with a nail splitter, and excised.  Area was cleansed.  The tourniquet was then removed and sterile dressing applied. Disposition: Patient tolerated procedure well. Patient to return in 2 weeks for follow-up.   Return in about 2 weeks (around 09/05/2017) for 2 week nail check (collect copay no global).

## 2017-08-22 NOTE — Patient Instructions (Signed)
Place 1/4 cup of epsom salts in a quart of warm tap water.  Submerge your foot or feet in the solution and soak for 20 minutes.  This soak should be done twice a day.  Next, remove your foot or feet from solution, blot dry the affected area. Apply ointment and cover if instructed by your doctor.   IF YOUR SKIN BECOMES IRRITATED WHILE USING THESE INSTRUCTIONS, IT IS OKAY TO SWITCH TO  WHITE VINEGAR AND WATER.  As another alternative soak, you may use antibacterial soap and water.  Monitor for any signs/symptoms of infection. Call the office immediately if any occur or go directly to the emergency room. Call with any questions/concerns.  Ingrown Toenail An ingrown toenail occurs when the corner or sides of your toenail grow into the surrounding skin. The big toe is most commonly affected, but it can happen to any of your toes. If your ingrown toenail is not treated, you will be at risk for infection. What are the causes? This condition may be caused by:  Wearing shoes that are too small or tight.  Injury or trauma, such as stubbing your toe or having your toe stepped on.  Improper cutting or care of your toenails.  Being born with (congenital) nail or foot abnormalities, such as having a nail that is too big for your toe.  What increases the risk? Risk factors for an ingrown toenail include:  Age. Your nails tend to thicken as you get older, so ingrown nails are more common in older people.  Diabetes.  Cutting your toenails incorrectly.  Blood circulation problems.  What are the signs or symptoms? Symptoms may include:  Pain, soreness, or tenderness.  Redness.  Swelling.  Hardening of the skin surrounding the toe.  Your ingrown toenail may be infected if there is fluid, pus, or drainage. How is this diagnosed? An ingrown toenail may be diagnosed by medical history and physical exam. If your toenail is infected, your health care provider may test a sample of the  drainage. How is this treated? Treatment depends on the severity of your ingrown toenail. Some ingrown toenails may be treated at home. More severe or infected ingrown toenails may require surgery to remove all or part of the nail. Infected ingrown toenails may also be treated with antibiotic medicines. Follow these instructions at home:  If you were prescribed an antibiotic medicine, finish all of it even if you start to feel better.  Soak your foot in warm soapy water for 20 minutes, 3 times per day or as directed by your health care provider.  Carefully lift the edge of the nail away from the sore skin by wedging a small piece of cotton under the corner of the nail. This may help with the pain. Be careful not to cause more injury to the area.  Wear shoes that fit well. If your ingrown toenail is causing you pain, try wearing sandals, if possible.  Trim your toenails regularly and carefully. Do not cut them in a curved shape. Cut your toenails straight across. This prevents injury to the skin at the corners of the toenail.  Keep your feet clean and dry.  If you are having trouble walking and are given crutches by your health care provider, use them as directed.  Do not pick at your toenail or try to remove it yourself.  Take medicines only as directed by your health care provider.  Keep all follow-up visits as directed by your health care provider. This   is important. Contact a health care provider if:  Your symptoms do not improve with treatment. Get help right away if:  You have red streaks that start at your foot and go up your leg.  You have a fever.  You have increased redness, swelling, or pain.  You have fluid, blood, or pus coming from your toenail. This information is not intended to replace advice given to you by your health care provider. Make sure you discuss any questions you have with your health care provider. Document Released: 07/12/2000 Document Revised:  12/15/2015 Document Reviewed: 06/08/2014 Elsevier Interactive Patient Education  2018 [employer]  

## 2017-08-27 ENCOUNTER — Ambulatory Visit: Payer: Self-pay | Admitting: Podiatry

## 2017-09-05 ENCOUNTER — Ambulatory Visit: Payer: Self-pay | Admitting: Podiatry

## 2017-11-18 DIAGNOSIS — H9313 Tinnitus, bilateral: Secondary | ICD-10-CM | POA: Insufficient documentation

## 2017-11-18 DIAGNOSIS — H833X3 Noise effects on inner ear, bilateral: Secondary | ICD-10-CM | POA: Insufficient documentation

## 2018-03-12 ENCOUNTER — Ambulatory Visit (INDEPENDENT_AMBULATORY_CARE_PROVIDER_SITE_OTHER): Payer: Medicare HMO

## 2018-03-12 ENCOUNTER — Ambulatory Visit: Payer: Medicare HMO | Admitting: Podiatry

## 2018-03-12 DIAGNOSIS — M79671 Pain in right foot: Secondary | ICD-10-CM | POA: Diagnosis not present

## 2018-03-12 DIAGNOSIS — M722 Plantar fascial fibromatosis: Secondary | ICD-10-CM

## 2018-03-12 DIAGNOSIS — M7751 Other enthesopathy of right foot: Secondary | ICD-10-CM

## 2018-03-12 DIAGNOSIS — M7731 Calcaneal spur, right foot: Secondary | ICD-10-CM | POA: Diagnosis not present

## 2018-03-12 NOTE — Patient Instructions (Signed)

## 2018-03-28 NOTE — Progress Notes (Signed)
  Subjective:  Patient ID: Brian Levine, male    DOB: October 27, 1945,  MRN: [id number]  No chief complaint on file.   72 y.o. male presents with the above complaint.  Reports heel pain to the right heel for the past 6 weeks.  Has tried icing.  Reports pain at 5 out of 10.   Review of Systems: Negative except as noted in the HPI. Denies N/V/F/Ch.  Past Medical History:  Diagnosis Date  . BPH (benign prostatic hyperplasia) 2004  . Constipation   . Other pancytopenia ([place]) 05/04/2013  . Thrombocytopenia, unspecified ([place]) 08/17/2013  . Weight loss 05/04/2013    Current Outpatient Medications:  .  aspirin EC 81 MG tablet, Take 81 mg by mouth daily., Disp: , Rfl:  .  cholecalciferol (VITAMIN D) 1000 UNITS tablet, Take 1,000 Units by mouth daily., Disp: , Rfl:  .  ciprofloxacin (CIPRO) 500 MG tablet, Take 1 tablet (500 mg total) by mouth 2 (two) times daily. (Patient not taking: Reported on 08/22/2017), Disp: 6 tablet, Rfl: 0 .  doxazosin (CARDURA) 8 MG tablet, , Disp: , Rfl:  .  pravastatin (PRAVACHOL) 40 MG tablet, Take 40 mg by mouth daily at 6 PM. , Disp: , Rfl:  .  traMADol (ULTRAM) 50 MG tablet, Take 1 tablet (50 mg total) by mouth every 6 (six) hours as needed., Disp: 12 tablet, Rfl: 0  Social History   Tobacco Use  Smoking Status Former Smoker  . Packs/day: 1.50  . Years: 30.00  . Pack years: 45.00  . Last attempt to quit: 07/30/1995  . Years since quitting: 22.6  Smokeless Tobacco Never Used    No Known Allergies Objective:  There were no vitals filed for this visit. There is no height or weight on file to calculate BMI. Constitutional Well developed. Well nourished.  Vascular Dorsalis pedis pulses palpable bilaterally. Posterior tibial pulses palpable bilaterally. Capillary refill normal to all digits.  No cyanosis or clubbing noted. Pedal hair growth normal.  Neurologic Normal speech. Oriented to person, place, and time. Epicritic sensation to light touch grossly  present bilaterally.  Dermatologic Nails well groomed and normal in appearance. No open wounds. No skin lesions.  Orthopedic: Normal joint ROM without pain or crepitus bilaterally. No visible deformities. Tender to palpation at the calcaneal tuber right. No pain with calcaneal squeeze right. Ankle ROM diminished range of motion right. Silfverskiold Test: positive right.   Radiographs: Taken and reviewed. No acute fractures or dislocations. No evidence of stress fracture.  Plantar heel spur present. Posterior heel spur present.   Assessment:   1. Plantar fasciitis of right foot   2. Pain of right heel   3. Bursitis of heel, right   4. Heel spur, right    Plan:  Patient was evaluated and treated and all questions answered.  Plantar Fasciitis, right - XR reviewed as above.  - Educated on icing and stretching. Instructions given.  - Injection delivered to the plantar fascia as below. - DME: Night splint dispensed. - Pharmacologic management: OTC NSAIDs  Procedure: Injection Tendon/Ligament Location: Right plantar fascia at the glabrous junction; medial approach. Skin Prep: alcohol Injectate: 1 cc 0.5% marcaine plain, 1 cc dexamethasone phosphate, 0.5 cc kenalog 10. Disposition: Patient tolerated procedure well. Injection site dressed with a band-aid.  Return in about 3 weeks (around 04/02/2018) for Plantar fasciitis.

## 2018-04-02 ENCOUNTER — Ambulatory Visit: Payer: Medicare HMO | Admitting: Podiatry

## 2018-04-02 DIAGNOSIS — M722 Plantar fascial fibromatosis: Secondary | ICD-10-CM

## 2018-04-02 NOTE — Progress Notes (Signed)
  Subjective:  Patient ID: Brian Levine, male    DOB: [birth year],  MRN: [id number]  Chief Complaint  Patient presents with  . Plantar Fasciitis    right foot - not worse, but not a lot better either    72 y.o. male presents with the above complaint. States it is at the same level as it was previously, though he does believe it's not hurting as badly as last time.  Review of Systems: Negative except as noted in the HPI. Denies N/V/F/Ch.  Past Medical History:  Diagnosis Date  . BPH (benign prostatic hyperplasia) 2004  . Constipation   . Other pancytopenia ([place]) 05/04/2013  . Thrombocytopenia, unspecified ([place]) 08/17/2013  . Weight loss 05/04/2013    Current Outpatient Medications:  .  aspirin EC 81 MG tablet, Take 81 mg by mouth daily., Disp: , Rfl:  .  cholecalciferol (VITAMIN D) 1000 UNITS tablet, Take 1,000 Units by mouth daily., Disp: , Rfl:  .  ciprofloxacin (CIPRO) 500 MG tablet, Take 1 tablet (500 mg total) by mouth 2 (two) times daily. (Patient not taking: Reported on 08/22/2017), Disp: 6 tablet, Rfl: 0 .  doxazosin (CARDURA) 8 MG tablet, , Disp: , Rfl:  .  pravastatin (PRAVACHOL) 40 MG tablet, Take 40 mg by mouth daily at 6 PM. , Disp: , Rfl:  .  traMADol (ULTRAM) 50 MG tablet, Take 1 tablet (50 mg total) by mouth every 6 (six) hours as needed., Disp: 12 tablet, Rfl: 0  Social History   Tobacco Use  Smoking Status Former Smoker  . Packs/day: 1.50  . Years: 30.00  . Pack years: 45.00  . Last attempt to quit: 07/30/1995  . Years since quitting: 22.6  Smokeless Tobacco Never Used    No Known Allergies Objective:  There were no vitals filed for this visit. There is no height or weight on file to calculate BMI. Constitutional Well developed. Well nourished.  Vascular Dorsalis pedis pulses palpable bilaterally. Posterior tibial pulses palpable bilaterally. Capillary refill normal to all digits.  No cyanosis or clubbing noted. Pedal hair growth normal.  Neurologic  Normal speech. Oriented to person, place, and time. Epicritic sensation to light touch grossly present bilaterally.  Dermatologic Nails well groomed and normal in appearance. No open wounds. No skin lesions.  Orthopedic: Normal joint ROM without pain or crepitus bilaterally. No visible deformities. Tender to palpation at the calcaneal tuber right. No pain with calcaneal squeeze right. Ankle ROM diminished range of motion right. Silfverskiold Test: positive right.   Radiographs: None today.  Assessment:   1. Plantar fasciitis of right foot    Plan:  Patient was evaluated and treated and all questions answered.  Plantar Fasciitis, right - Continue stretching and icing. - Injection #2 delivered to the plantar fascia as below. - DME: Continue night splint. - Pharmacologic management: OTC NSAIDs  Procedure: Injection Tendon/Ligament Consent: Verbal consent obtained. Location: Right plantar fascia at the glabrous junction; medial approach. Skin Prep: Alcohol. Injectate: 1 cc 0.5% marcaine plain, 1 cc dexamethasone phosphate, 0.5 cc kenalog 40. Disposition: Patient tolerated procedure well. Injection site dressed with a band-aid.    Return in about 3 weeks (around 04/23/2018) for Plantar fasciitis.

## 2018-04-21 DIAGNOSIS — Z1389 Encounter for screening for other disorder: Secondary | ICD-10-CM | POA: Diagnosis not present

## 2018-04-21 DIAGNOSIS — E7800 Pure hypercholesterolemia, unspecified: Secondary | ICD-10-CM | POA: Diagnosis not present

## 2018-04-21 DIAGNOSIS — D696 Thrombocytopenia, unspecified: Secondary | ICD-10-CM | POA: Diagnosis not present

## 2018-04-21 DIAGNOSIS — N400 Enlarged prostate without lower urinary tract symptoms: Secondary | ICD-10-CM | POA: Diagnosis not present

## 2018-04-21 DIAGNOSIS — Z0000 Encounter for general adult medical examination without abnormal findings: Secondary | ICD-10-CM | POA: Diagnosis not present

## 2018-04-23 ENCOUNTER — Ambulatory Visit: Payer: Self-pay | Admitting: Podiatry

## 2018-11-05 ENCOUNTER — Other Ambulatory Visit: Payer: Self-pay

## 2018-11-05 ENCOUNTER — Ambulatory Visit: Payer: Medicare HMO | Admitting: Podiatry

## 2018-11-05 VITALS — Temp 97.5°F

## 2018-11-05 DIAGNOSIS — L600 Ingrowing nail: Secondary | ICD-10-CM

## 2018-11-05 DIAGNOSIS — M79676 Pain in unspecified toe(s): Secondary | ICD-10-CM

## 2018-11-05 MED ORDER — NEOMYCIN-POLYMYXIN-HC 3.5-10000-1 OT SOLN: OTIC | 0 refills | Status: DC

## 2018-11-05 NOTE — Patient Instructions (Signed)
Epsom Salt Soak Instructions    IF SOAKING IS STILL NECESSARY, START THIS 2 WEEKS AFTER INITIAL PROCEDURE   Place 1/4 cup of epsom salts in a quart of warm tap water.  Soak your foot or feet in the solution for 20 minutes twice a day until you notice the area has dried and a scab has formed. Continue to apply other medications to the area as directed by the doctor such as polysporin, neosporin or cortisporin drops.  IF YOUR SKIN BECOMES IRRITATED WHILE USING THESE INSTRUCTIONS, IT IS OKAY TO SWITCH TO  WHITE VINEGAR AND WATER. Or you may use antibacterial soap and water to keep the toe clean  Monitor for any signs/symptoms of infection. Call the office immediately if any occur or go directly to the emergency room. Call with any questions/concerns.  [place] Instructions-Post Nail Surgery  You have had your ingrown toenail and root treated with a chemical.  This chemical causes a burn that will drain and ooze like a blister.  This can drain for 6-8 weeks or longer.  It is important to keep this area clean, covered, and follow the soaking instructions dispensed at the time of your surgery.  This area will eventually dry and form a scab.  Once the scab forms you no longer need to soak or apply a dressing.  If at any time you experience an increase in pain, redness, swelling, or drainage, you should contact the office as soon as possible.

## 2018-11-05 NOTE — Progress Notes (Signed)
Subjective:  Patient ID: Brian Levine, male    DOB: 1946/05/05,  MRN: [id number]  Chief Complaint  Patient presents with  . Ingrown Toenail    lt hallux lateral border" my toe is very painful, I feel like tha nail is ingrown again"     73 y.o. male presents with the above complaint.   Review of Systems: Negative except as noted in the HPI. Denies N/V/F/Ch.  Past Medical History:  Diagnosis Date  . BPH (benign prostatic hyperplasia) 2004  . Constipation   . 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 Thrombocytopenia, unspecified ([place]) 08/17/2013  . Weight loss 05/04/2013    Current Outpatient Medications:  .  aspirin EC 81 MG tablet, Take 81 mg by mouth daily., Disp: , Rfl:  .  cholecalciferol (VITAMIN D) 1000 UNITS tablet, Take 1,000 Units by mouth daily., Disp: , Rfl:  .  doxazosin (CARDURA) 8 MG tablet, , Disp: , Rfl:  .  neomycin-polymyxin-hydrocortisone (CORTISPORIN) OTIC solution, Apply 2 drops to the ingrown toenail site twice daily. Cover with band-aid., Disp: 10 mL, Rfl: 0 .  pravastatin (PRAVACHOL) 40 MG tablet, Take 40 mg by mouth daily at 6 PM. , Disp: , Rfl:  .  traMADol (ULTRAM) 50 MG tablet, Take 1 tablet (50 mg total) by mouth every 6 (six) hours as needed., Disp: 12 tablet, Rfl: 0  Social History   Tobacco Use  Smoking Status Former Smoker  . Packs/day: 1.50  . Years: 30.00  . Pack years: 45.00  . Last attempt to quit: 07/30/1995  . Years since quitting: 23.2  Smokeless Tobacco Never Used    No Known Allergies Objective:   Vitals:   11/05/18 0943  Temp: (!) 97.5 F (36.4 C)   There is no height or weight on file to calculate BMI. Constitutional Well developed. Well nourished.  Vascular Dorsalis pedis pulses palpable bilaterally. Posterior tibial pulses palpable bilaterally. Capillary refill normal to all digits.  No cyanosis or clubbing noted. Pedal hair growth normal.  Neurologic Normal speech. Oriented to person, place, and time. Epicritic  sensation to light touch grossly present bilaterally.  Dermatologic Painful ingrowing nail at lateral nail borders of the hallux nail left. No other open wounds. No skin lesions.  Orthopedic: Normal joint ROM without pain or crepitus bilaterally. No visible deformities. No bony tenderness.   Radiographs: None Assessment:   1. Ingrown nail   2. Pain around toenail    Plan:  Patient was evaluated and treated and all questions answered.  Ingrown Nail, left -Patient elects to proceed with minor surgery to remove ingrown toenail removal today. Consent reviewed and signed by patient. -Ingrown nail excised. See procedure note. -Educated on post-procedure care including soaking. Written instructions provided and reviewed. -Patient to follow up in 2 weeks for nail check.  Procedure: Excision of Ingrown Toenail Location: Left 1st lateral nail borders. Anesthesia: Lidocaine 1% plain; 1.5 mL and Marcaine 0.5% plain; 1.5 mL, digital block. Skin Prep: Betadine. Dressing: Silvadene; telfa; dry, sterile, compression dressing. Technique: Following skin prep, the toe was exsanguinated and a tourniquet was secured at the base of the toe. The affected nail border was freed, split with a nail splitter, and excised. Chemical matrixectomy was then performed with phenol and irrigated out with alcohol. The tourniquet was then removed and sterile dressing applied. Disposition: Patient tolerated procedure well. Patient to return in 2 weeks for follow-up.   Plantar fasciitis right -still bothering per patient. Will discuss next visit.  Return in about 2 weeks (  around 11/19/2018) for Nail Check, Plantar fasciitis, Right.

## 2018-11-19 ENCOUNTER — Ambulatory Visit: Payer: Medicare HMO | Admitting: Podiatry

## 2019-02-15 DIAGNOSIS — Z961 Presence of intraocular lens: Secondary | ICD-10-CM | POA: Diagnosis not present

## 2019-02-15 DIAGNOSIS — H524 Presbyopia: Secondary | ICD-10-CM | POA: Diagnosis not present

## 2019-02-15 DIAGNOSIS — H10413 Chronic giant papillary conjunctivitis, bilateral: Secondary | ICD-10-CM | POA: Diagnosis not present

## 2019-04-23 DIAGNOSIS — Z1389 Encounter for screening for other disorder: Secondary | ICD-10-CM | POA: Diagnosis not present

## 2019-04-23 DIAGNOSIS — Z0000 Encounter for general adult medical examination without abnormal findings: Secondary | ICD-10-CM | POA: Diagnosis not present

## 2019-04-26 DIAGNOSIS — Z23 Encounter for immunization: Secondary | ICD-10-CM | POA: Diagnosis not present

## 2019-04-26 DIAGNOSIS — N400 Enlarged prostate without lower urinary tract symptoms: Secondary | ICD-10-CM | POA: Diagnosis not present

## 2019-04-26 DIAGNOSIS — D696 Thrombocytopenia, unspecified: Secondary | ICD-10-CM | POA: Diagnosis not present

## 2019-04-26 DIAGNOSIS — E7800 Pure hypercholesterolemia, unspecified: Secondary | ICD-10-CM | POA: Diagnosis not present

## 2019-04-26 DIAGNOSIS — Z125 Encounter for screening for malignant neoplasm of prostate: Secondary | ICD-10-CM | POA: Diagnosis not present

## 2019-04-26 DIAGNOSIS — S46811A Strain of other muscles, fascia and tendons at shoulder and upper arm level, right arm, initial encounter: Secondary | ICD-10-CM | POA: Diagnosis not present

## 2019-08-19 ENCOUNTER — Ambulatory Visit: Payer: Medicare HMO | Attending: Internal Medicine

## 2019-08-19 DIAGNOSIS — Z23 Encounter for immunization: Secondary | ICD-10-CM

## 2019-08-19 NOTE — Progress Notes (Signed)
   Covid-19 Vaccination Clinic  Name:  LADARRIOUS VU    MRN: [id number] DOB: 01/13/1946  08/19/2019  Mr. Morace was observed post Covid-19 immunization for 15 minutes without incidence. He was provided with Vaccine Information Sheet and instruction to access the V-Safe system.   Mr. Hemp was instructed to call 911 with any severe reactions post vaccine: Marland Kitchen Difficulty breathing  . Swelling of your face and throat  . A fast heartbeat  . A bad rash all over your body  . Dizziness and weakness    Immunizations Administered    Name Date Dose VIS Date Route   Pfizer COVID-19 Vaccine 08/19/2019  5:42 PM 0.3 mL 07/09/2019 Intramuscular   Manufacturer: [place]   Lot: [id number]   [place]: [id number]

## 2019-09-09 ENCOUNTER — Ambulatory Visit: Payer: Medicare HMO | Attending: Internal Medicine

## 2019-09-09 DIAGNOSIS — Z23 Encounter for immunization: Secondary | ICD-10-CM | POA: Insufficient documentation

## 2019-09-09 NOTE — Progress Notes (Signed)
   Covid-19 Vaccination Clinic  Name:  Brian Levine    MRN: [id number] DOB: 1946-01-27  09/09/2019  [name] was observed post Covid-19 immunization for 15 minutes without incidence. He was provided with Vaccine Information Sheet and instruction to access the V-Safe system.   [name] was instructed to call 911 with any severe reactions post vaccine: Marland Kitchen Difficulty breathing  . Swelling of your face and throat  . A fast heartbeat  . A bad rash all over your body  . Dizziness and weakness    Immunizations Administered    Name Date Dose VIS Date Route   Pfizer COVID-19 Vaccine 09/09/2019  9:07 AM 0.3 mL 07/09/2019 Intramuscular   Manufacturer: [place]   Lot: [id number]   [place]: [id number]

## 2019-10-04 DIAGNOSIS — D229 Melanocytic nevi, unspecified: Secondary | ICD-10-CM | POA: Diagnosis not present

## 2019-10-04 DIAGNOSIS — L821 Other seborrheic keratosis: Secondary | ICD-10-CM | POA: Diagnosis not present

## 2019-10-04 DIAGNOSIS — L814 Other melanin hyperpigmentation: Secondary | ICD-10-CM | POA: Diagnosis not present

## 2019-10-04 DIAGNOSIS — D485 Neoplasm of uncertain behavior of skin: Secondary | ICD-10-CM | POA: Diagnosis not present

## 2019-10-05 DIAGNOSIS — L723 Sebaceous cyst: Secondary | ICD-10-CM | POA: Diagnosis not present

## 2020-02-28 DIAGNOSIS — Z961 Presence of intraocular lens: Secondary | ICD-10-CM | POA: Diagnosis not present

## 2020-02-28 DIAGNOSIS — H524 Presbyopia: Secondary | ICD-10-CM | POA: Diagnosis not present

## 2020-05-04 DIAGNOSIS — Z125 Encounter for screening for malignant neoplasm of prostate: Secondary | ICD-10-CM | POA: Diagnosis not present

## 2020-05-04 DIAGNOSIS — Z23 Encounter for immunization: Secondary | ICD-10-CM | POA: Diagnosis not present

## 2020-05-04 DIAGNOSIS — N400 Enlarged prostate without lower urinary tract symptoms: Secondary | ICD-10-CM | POA: Diagnosis not present

## 2020-05-04 DIAGNOSIS — D696 Thrombocytopenia, unspecified: Secondary | ICD-10-CM | POA: Diagnosis not present

## 2020-05-04 DIAGNOSIS — Z0000 Encounter for general adult medical examination without abnormal findings: Secondary | ICD-10-CM | POA: Diagnosis not present

## 2020-05-04 DIAGNOSIS — E7800 Pure hypercholesterolemia, unspecified: Secondary | ICD-10-CM | POA: Diagnosis not present

## 2020-05-04 DIAGNOSIS — Z1389 Encounter for screening for other disorder: Secondary | ICD-10-CM | POA: Diagnosis not present

## 2020-05-04 DIAGNOSIS — D126 Benign neoplasm of colon, unspecified: Secondary | ICD-10-CM | POA: Diagnosis not present

## 2020-07-12 DIAGNOSIS — Z0100 Encounter for examination of eyes and vision without abnormal findings: Secondary | ICD-10-CM | POA: Diagnosis not present

## 2020-07-31 DIAGNOSIS — N430 Encysted hydrocele: Secondary | ICD-10-CM | POA: Diagnosis not present

## 2020-07-31 DIAGNOSIS — N401 Enlarged prostate with lower urinary tract symptoms: Secondary | ICD-10-CM | POA: Diagnosis not present

## 2020-07-31 DIAGNOSIS — R3912 Poor urinary stream: Secondary | ICD-10-CM | POA: Diagnosis not present

## 2020-09-06 DIAGNOSIS — R3912 Poor urinary stream: Secondary | ICD-10-CM | POA: Diagnosis not present

## 2020-09-06 DIAGNOSIS — R3914 Feeling of incomplete bladder emptying: Secondary | ICD-10-CM | POA: Diagnosis not present

## 2020-09-06 DIAGNOSIS — R351 Nocturia: Secondary | ICD-10-CM | POA: Diagnosis not present

## 2020-09-06 DIAGNOSIS — N401 Enlarged prostate with lower urinary tract symptoms: Secondary | ICD-10-CM | POA: Diagnosis not present

## 2020-09-19 ENCOUNTER — Ambulatory Visit: Payer: Medicare HMO | Admitting: Podiatry

## 2020-09-21 ENCOUNTER — Ambulatory Visit (INDEPENDENT_AMBULATORY_CARE_PROVIDER_SITE_OTHER): Payer: Medicare HMO

## 2020-09-21 ENCOUNTER — Other Ambulatory Visit: Payer: Self-pay | Admitting: Podiatrist

## 2020-09-21 ENCOUNTER — Ambulatory Visit: Payer: Medicare HMO | Admitting: Podiatrist

## 2020-09-21 ENCOUNTER — Other Ambulatory Visit: Payer: Self-pay

## 2020-09-21 ENCOUNTER — Encounter: Payer: Self-pay | Admitting: Podiatrist

## 2020-09-21 DIAGNOSIS — S90829A Blister (nonthermal), unspecified foot, initial encounter: Secondary | ICD-10-CM

## 2020-09-21 DIAGNOSIS — S9032XA Contusion of left foot, initial encounter: Secondary | ICD-10-CM

## 2020-09-21 DIAGNOSIS — M79672 Pain in left foot: Secondary | ICD-10-CM

## 2020-09-21 DIAGNOSIS — S9030XA Contusion of unspecified foot, initial encounter: Secondary | ICD-10-CM | POA: Diagnosis not present

## 2020-09-21 NOTE — Patient Instructions (Signed)
Apply the betadine the the left heel and allow to dry-  This will dry out the blister and allow it to fall off on its own.  Try HOKA, new balance, brooks... etc shoes-- [employer] has a good selection with helpful shoe specialists to get you in the right pair of shoes.  Call if you notice any drainage, redness or pain.  Otherwise it should resolve over the next 4-6 weeks.

## 2020-09-25 NOTE — Progress Notes (Signed)
Chief Complaint  Patient presents with  . foot lesion    Lt bottom heel lesion/bruised -pt walked on threadmill for 30 minutes and noticed lesion, memory foam from insert collapsed and was walking on rubber x 5 wks; 5/10 tenderness - no swelling tx: none      HPI: Patient is 75 y.o. male who presents today for the concerns as listed above. He has bruises on the bottoms of left greater than right heels of which he is concerned as they have not healed and it has been 5 weeks since his initial injury.  He does a lot of walking for exercise.  He relates 5/10 pain at this time.   Patient Active Problem List   Diagnosis Date Noted  . 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 Leukopenia 08/17/2013  . Thrombocytopenia, unspecified ([place]) 08/17/2013  . Weight loss 05/04/2013  . 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 aspirin EC 81 MG tablet Take 81 mg by mouth daily.    . cholecalciferol (VITAMIN D) 1000 UNITS tablet Take 1,000 Units by mouth daily.    Marland Kitchen doxazosin (CARDURA) 8 MG tablet     . neomycin-polymyxin-hydrocortisone (CORTISPORIN) OTIC solution Apply 2 drops to the ingrown toenail site twice daily. Cover with band-aid. 10 mL 0  . pravastatin (PRAVACHOL) 40 MG tablet Take 40 mg by mouth daily at 6 PM.     . traMADol (ULTRAM) 50 MG tablet Take 1 tablet (50 mg total) by mouth every 6 (six) hours as needed. 12 tablet 0   No current facility-administered medications on file prior to visit.    No Known Allergies  Review of Systems No fevers, chills, nausea, muscle aches, no difficulty breathing, no calf pain, no chest pain or shortness of breath.   Physical Exam  GENERAL APPEARANCE: Alert, conversant. Appropriately groomed. No acute distress.   VASCULAR: Pedal pulses palpable DP and PT bilateral.  Capillary refill time is immediate to all digits,   Proximal to distal cooling it warm to warm.  Digital perfusion adequate.   NEUROLOGIC: sensation is intact to 5.07 monofilament at 5/5 sites bilateral.  Light touch is intact bilateral, vibratory sensation intact bilateral  MUSCULOSKELETAL: acceptable muscle strength, tone and stability bilateral.  No gross boney pedal deformities noted.  No pain, crepitus or limitation noted with foot and ankle range of motion bilateral.   DERMATOLOGIC: skin is warm, supple, and dry. Left heel has a dried hematoma present on the plantar surface of the heel. The right heel has an area of dried skin where a blister was present.  No sign of infection present.  No redness, no drainiage noted.  No pain with direct or lateral compression.   Radiographic exam:  Normal osseous mineralization.  No fracture or dislocation or acute osseous abnormalities present.  Joint spaces are normal.   Assessment     ICD-10-CM   1. Contusion of foot or heel, initial encounter  S90.30XA   2. Blister of heel, unspecified laterality, initial encounter  [id number]      Plan  Discussed exam findings with the patient and recommended he be sure to wear a well fitting, shoe with good cushioning in the heels.  I told him to leave the skin intact at the blister regions as they will fall off on their own when ready.  If he notices any pain, drianage or swelling or  redness he is instructed to call.

## 2020-09-26 ENCOUNTER — Ambulatory Visit: Payer: Medicare HMO | Admitting: Podiatry

## 2020-09-27 DIAGNOSIS — N210 Calculus in bladder: Secondary | ICD-10-CM | POA: Diagnosis not present

## 2020-09-27 DIAGNOSIS — R3914 Feeling of incomplete bladder emptying: Secondary | ICD-10-CM | POA: Diagnosis not present

## 2020-09-27 DIAGNOSIS — N401 Enlarged prostate with lower urinary tract symptoms: Secondary | ICD-10-CM | POA: Diagnosis not present

## 2020-09-28 ENCOUNTER — Other Ambulatory Visit: Payer: Self-pay | Admitting: Urology

## 2020-10-18 NOTE — Patient Instructions (Addendum)
DUE TO COVID-19 ONLY ONE VISITOR IS ALLOWED TO COME WITH YOU AND STAY IN THE WAITING ROOM ONLY DURING PRE OP AND PROCEDURE DAY OF SURGERY. THE 1 VISITOR  MAY VISIT WITH YOU AFTER SURGERY IN YOUR PRIVATE ROOM DURING VISITING HOURS ONLY!  YOU NEED TO HAVE A COVID 19 TEST ON: 10/20/20 @ 1:35 PM , THIS TEST MUST BE DONE BEFORE SURGERY,  COVID TESTING SITE [place] [id number], IT IS ON THE RIGHT GOING OUT [address] APPROXIMATELY  2 MINUTES PAST [employer] ON THE RIGHT. ONCE YOUR COVID TEST IS COMPLETED,  PLEASE BEGIN THE QUARANTINE INSTRUCTIONS AS OUTLINED IN YOUR HANDOUT.                Brian Levine    Your procedure is scheduled on: 10/24/20   Report to [facility] Main  Entrance   Report to short stay at : 5:30 AM     Call this number if you have problems the morning of surgery [phone]    Remember: Do not eat food or drink liquids :After Midnight.   BRUSH YOUR TEETH MORNING OF SURGERY AND RINSE YOUR MOUTH OUT, NO CHEWING GUM CANDY OR MINTS.                               You may not have any metal on your body including hair pins and              piercings  Do not wear jewelry, lotions, powders or perfumes, deodorant             Men may shave face and neck.   Do not bring valuables to the hospital. [place].  Contacts, dentures or bridgework may not be worn into surgery.  Leave suitcase in the car. After surgery it may be brought to your room.     Patients discharged the day of surgery will not be allowed to drive home. IF YOU ARE HAVING SURGERY AND GOING HOME THE SAME DAY, YOU MUST HAVE AN ADULT TO DRIVE YOU HOME AND BE WITH YOU FOR 24 HOURS. YOU MAY GO HOME BY TAXI OR UBER OR ORTHERWISE, BUT AN ADULT MUST ACCOMPANY YOU HOME AND STAY WITH YOU FOR 24 HOURS.  Name and phone number of your driver:  Special Instructions: N/A              Please read over the following fact sheets you were  given: _____________________________________________________________________        [facility] - Preparing for Surgery Before surgery, you can play an important role.  Because skin is not sterile, your skin needs to be as free of germs as possible.  You can reduce the number of germs on your skin by washing with CHG (chlorahexidine gluconate) soap before surgery.  CHG is an antiseptic cleaner which kills germs and bonds with the skin to continue killing germs even after washing. Please DO NOT use if you have an allergy to CHG or antibacterial soaps.  If your skin becomes reddened/irritated stop using the CHG and inform your nurse when you arrive at Short Stay. Do not shave (including legs and underarms) for at least 48 hours prior to the first CHG shower.  You may shave your face/neck. Please follow these instructions carefully:  1.  Shower with CHG Soap the night before surgery and the  morning of Surgery.  2.  If you choose to wash your hair, wash your hair first as usual with your  normal  shampoo.  3.  After you shampoo, rinse your hair and body thoroughly to remove the  shampoo.                           4.  Use CHG as you would any other liquid soap.  You can apply chg directly  to the skin and wash                       Gently with a scrungie or clean washcloth.  5.  Apply the CHG Soap to your body ONLY FROM THE NECK DOWN.   Do not use on face/ open                           Wound or open sores. Avoid contact with eyes, ears mouth and genitals (private parts).                       Wash face,  Genitals (private parts) with your normal soap.             6.  Wash thoroughly, paying special attention to the area where your surgery  will be performed.  7.  Thoroughly rinse your body with warm water from the neck down.  8.  DO NOT shower/wash with your normal soap after using and rinsing off  the CHG Soap.                9.  Pat yourself dry with a clean towel.            10.  Wear clean  pajamas.            11.  Place clean sheets on your bed the night of your first shower and do not  sleep with pets. Day of Surgery : Do not apply any lotions/deodorants the morning of surgery.  Please wear clean clothes to the hospital/surgery center.  FAILURE TO FOLLOW THESE INSTRUCTIONS MAY RESULT IN THE CANCELLATION OF YOUR SURGERY PATIENT SIGNATURE_________________________________  NURSE SIGNATURE__________________________________  ________________________________________________________________________

## 2020-10-19 ENCOUNTER — Encounter (HOSPITAL_COMMUNITY)
Admission: RE | Admit: 2020-10-19 | Discharge: 2020-10-19 | Disposition: A | Discharge status: Home / Self Care | Payer: Medicare HMO | Source: Ambulatory Visit | Attending: Urology | Admitting: Urology

## 2020-10-19 ENCOUNTER — Other Ambulatory Visit: Payer: Self-pay

## 2020-10-19 ENCOUNTER — Encounter (HOSPITAL_COMMUNITY): Payer: Self-pay

## 2020-10-19 DIAGNOSIS — Z01812 Encounter for preprocedural laboratory examination: Secondary | ICD-10-CM | POA: Diagnosis not present

## 2020-10-19 LAB — CBC
HCT: 37.3 % — ABNORMAL LOW (ref 39.0–52.0)
Hemoglobin: 12.5 g/dL — ABNORMAL LOW (ref 13.0–17.0)
MCH: 30.5 pg (ref 26.0–34.0)
MCHC: 33.5 g/dL (ref 30.0–36.0)
MCV: 91 fL (ref 80.0–100.0)
Platelets: 129 10*3/uL — ABNORMAL LOW (ref 150–400)
RBC: 4.1 MIL/uL — ABNORMAL LOW (ref 4.22–5.81)
RDW: 12.9 % (ref 11.5–15.5)
WBC: 3.9 10*3/uL — ABNORMAL LOW (ref 4.0–10.5)
nRBC: 0 % (ref 0.0–0.2)

## 2020-10-19 NOTE — Progress Notes (Signed)
COVID Vaccine Completed: YES Date COVID Vaccine completed: 03/2020 COVID vaccine manufacturer: [employer]      PCP - Dr. Lavone Orn Cardiologist -   Chest x-ray -  EKG -  Stress Test -  ECHO -  Cardiac Cath -  Pacemaker/ICD device last checked:  Sleep Study -  CPAP -   Fasting Blood Sugar -  Checks Blood Sugar _____ times a day  Blood Thinner Instructions: Aspirin Instructions: Last Dose:  Anesthesia review:   Patient denies shortness of breath, fever, cough and chest pain at PAT appointment   Patient verbalized understanding of instructions that were given to them at the PAT appointment. Patient was also instructed that they will need to review over the PAT instructions again at home before surgery.

## 2020-10-20 ENCOUNTER — Other Ambulatory Visit (HOSPITAL_COMMUNITY)
Admission: RE | Admit: 2020-10-20 | Discharge: 2020-10-20 | Disposition: A | Discharge status: Home / Self Care | Payer: Medicare HMO | Source: Ambulatory Visit | Attending: Urology | Admitting: Urology

## 2020-10-20 DIAGNOSIS — Z20822 Contact with and (suspected) exposure to covid-19: Secondary | ICD-10-CM | POA: Insufficient documentation

## 2020-10-20 DIAGNOSIS — Z01812 Encounter for preprocedural laboratory examination: Secondary | ICD-10-CM | POA: Insufficient documentation

## 2020-10-20 LAB — SARS CORONAVIRUS 2 (TAT 6-24 HRS): SARS Coronavirus 2: NEGATIVE

## 2020-10-23 NOTE — H&P (View-Only) (Signed)
I have an enlarged prostate (follow-up).  HPI: Brian Levine is a 75 year-old male established patient who is here for an enlarged prostate follow-up evaluation.  He is currently taking cardura.   09/27/20: Brian Levine returns today in f/u. He is on doxazosin and had finasteride added at his last visit. He is getting worse and his IPSS is up to 25. He had a Urolift in 2017. His UA is clear. He had trilobar hyperplasia with a small middle lobe and a volume of 26ml on his pre Urolift cystoscopy.   09/06/20: Brian Levine returns today in f/u after a trial of tamsulosin which he didn't find to be as effective as the doxazosin. His IPSS is 22 back on the doxazosin and his PVR is 156ml. He had increased hesitancy and a reduced stream on the tamsulosin. He has no hematuria or dysuria. He couldn't get a specimen today.    07/31/20: Brian Levine returns today in f/u. He was last seen in 2017 and had a Urolift on 08/22/15. He remains on doxazosin. He has some increased LUTS with a reduced stream and intermittency. His IPSS is 15. He primarily has issues at night with the reduced stream, straining to void and intermittency. He has no hematuria or dysuria. He reports that his PSA has been ok.     AUA Symptom Score: Less than 50% of the time he has the sensation of not emptying his bladder completely when finished urinating. Less than 50% of the time he has to urinate again fewer than two hours after he has finished urinating. Almost always he has to start and stop again several times when he urinates. Less than 20% of the time he finds it difficult to postpone urination. Almost always he has a weak urinary stream. Almost always he has to push or strain to begin urination. He has to get up to urinate 2 times from the time he goes to bed until the time he gets up in the morning.   Calculated AUA Symptom Score: 22    ALLERGIES: No Allergies    MEDICATIONS: Finasteride 5 mg tablet 1 tablet PO Daily  Aspirin Ec 81 mg tablet, delayed  release Oral  Doxazosin Mesylate 1 PO Daily  Pravastatin Sodium 40 MG Oral Tablet Oral  Vitamin D TABS Oral     GU PSH: Removal of spermatocele - 2011 Remove Hydrocele - 2010       PSH Notes: Cystourethroscopy W/ Insert Permanent Adjust Transprost Implant Single, Cystourethroscopy W/ Insert Permanent Adjust Transprost Implant Additional, Surgery Excision Of Spermatocele With Epididymectomy Right, Surgery Spermatic Cord Excision Of Hydrocele Right, Appendectomy   NON-GU PSH: Appendectomy - 2010     GU PMH: BPH w/LUTS, He did worse on the tamsulosin and his IPSS is up to 22 even on back on doxazosin with a PVR of 133ml. I discussed options including resuming finasteride or considering repeat intervention, possibly with a TURP. I have given him a script for finasteride and he will return in 3 months for a repeat PVR with FR and possible cystoscopy. he was given the TURP handout and I reviewed the risks of the procedure. I reviewd the risks of a TURP including bleeding, infection, incontinence, stricture, need for secondary procedures, ejaculatory and erectile dysfunction, thrombotic events, fluid overload and anesthetic complications. I explained that 95% of men will have relief of the obstructive symptoms and about 70% will have relief of the irritative symptoms. - 09/06/2020, His symptoms have worsened. I discussed options and will switch him  to tamsulosin from doxazosin. He will return in a month for a flowrate and PVR. , - 07/31/2020, Benign prostatic hyperplasia with urinary obstruction, - 2017 Incomplete bladder emptying, PVR 167ml. - 09/06/2020 Nocturia, He has worse symptoms at night. - 09/06/2020 Weak Urinary Stream - 09/06/2020, (Stable), - 07/31/2020, Weak urinary stream, - 2017 Hydrocele, He has a moderate right hydrocele without symptoms. - 07/31/2020 History of urolithiasis, History of kidney stones - 2016 Spermatocele of epididymis, Unspec, Spermatocele - 2016 Disorder of male genital organs,  unspecified, Disorder of scrotum - 2014    NON-GU PMH: Encounter for general adult medical examination without abnormal findings, Encounter for preventive health examination - 2017    FAMILY HISTORY: Acute Myocardial Infarction - Father Death In The Family Father - Runs In Family Death In The Family Mother - Runs In Family Family Health Status Number - Runs In Family Kidney Cancer - Runs In Family   SOCIAL HISTORY: Marital Status: Married Preferred Language: English; Race: White Current Smoking Status: Patient does not smoke anymore. Has not smoked since 07/30/1995. Smoked for 30 years.   Tobacco Use Assessment Completed: Used Tobacco in last 30 days? Has never drank.  Drinks 1 caffeinated drink per day.     Notes: Never A Smoker, Retired From Work, Caffeine Use, Occupation:, Alcohol Use   REVIEW OF SYSTEMS:    GU Review Male:   Patient reports burning/ pain with urination, get up at night to urinate, stream starts and stops, trouble starting your stream, and have to strain to urinate . Patient denies frequent urination, hard to postpone urination, leakage of urine, erection problems, and penile pain.  Gastrointestinal (Upper):   Patient denies nausea, vomiting, and indigestion/ heartburn.  Gastrointestinal (Lower):   Patient denies diarrhea and constipation.  Constitutional:   Patient reports night sweats. Patient denies fever, weight loss, and fatigue.  Skin:   Patient denies skin rash/ lesion and itching.  Eyes:   Patient denies blurred vision and double vision.  Ears/ Nose/ Throat:   Patient denies sore throat and sinus problems.  Hematologic/Lymphatic:   Patient reports easy bruising. Patient denies swollen glands.  Cardiovascular:   Patient denies leg swelling and chest pains.  Respiratory:   Patient denies cough and shortness of breath.  Endocrine:   Patient denies excessive thirst.  Musculoskeletal:   Patient denies back pain and joint pain.  Neurological:   Patient denies  headaches and dizziness.  Psychologic:   Patient denies depression and anxiety.   VITAL SIGNS:      09/27/2020 02:31 PM  Weight 180 lb / 81.65 kg  Height 71 in / 180.34 cm  BP 146/79 mmHg  Pulse 70 /min  Temperature 98.4 F / 36.8 C  BMI 25.1 kg/m   MULTI-SYSTEM PHYSICAL EXAMINATION:    Constitutional: Well-nourished. No physical deformities. Normally developed. Good grooming.  Respiratory: Normal breath sounds. No labored breathing, no use of accessory muscles.   Cardiovascular: Regular rate and rhythm. No murmur, no gallop. .      Complexity of Data:  Records Review:   AUA Symptom Score, Previous Patient Records  Urine Test Review:   Urinalysis   05/04/20  PSA  Total PSA 1.44 ng/ml    PROCEDURES:         Flexible Cystoscopy - 52000  Risks, benefits, and some of the potential complications of the procedure were discussed. 40ml of 2% lidocaine jelly was instilled intraurethrally.  Cipro 500mg  given for antibiotic prophylaxis.     Meatus:  Normal size.  Normal location. Normal condition.  Urethra:  No strictures.  External Sphincter:  Normal.  Verumontanum:  Normal.  Prostate:  Obstructing. Enlarged median lobe. Moderate hyperplasia. There is a stone adherent to the right BN that is probably on a Urolift tab.   Bladder Neck:  Non-obstructing.  Ureteral Orifices:  Normal location. Normal size. Normal shape. Effluxed clear urine.  Bladder:  Mild trabeculation. No tumors. Normal mucosa. No stones.      The procedure was well tolerated and there were no complications.         Urinalysis w/Scope Dipstick Dipstick Cont'd Micro  Color: Yellow Bilirubin: Neg mg/dL WBC/hpf: NS (Not Seen)  Appearance: Clear Ketones: Neg mg/dL RBC/hpf: NS (Not Seen)  Specific Gravity: 1.025 Blood: Neg ery/uL Bacteria: NS (Not Seen)  pH: 5.5 Protein: 1+ mg/dL Cystals: NS (Not Seen)  Glucose: Neg mg/dL Urobilinogen: 0.2 mg/dL Casts: NS (Not Seen)    Nitrites: Neg Trichomonas: Not Present     Leukocyte Esterase: Neg leu/uL Mucous: Not Present      Epithelial Cells: 0 - 5/hpf      Yeast: NS (Not Seen)      Sperm: Not Present    ASSESSMENT:      ICD-10 Details  1 GU:   BPH w/LUTS - N40.1 Chronic, Worsening - He has progressive LUTS despite the addition of finasteride and has an obstructing prostate with a stone at the right BN. After reviewing the options he has agreed to cystoscopy with cystolithalopaxy and TURP. I reviewd the risks of a TURP including bleeding, infection, incontinence, stricture, need for secondary procedures, ejaculatory and erectile dysfunction, thrombotic events, fluid overload and anesthetic complications. I explained that 95% of men will have relief of the obstructive symptoms and about 70% will have relief of the irritative symptoms.   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           Document Letter(s):  Created for Patient: Clinical Summary

## 2020-10-23 NOTE — H&P (Signed)
I have an enlarged prostate (follow-up).  HPI: Brian Levine is a 75 year-old male established patient who is here for an enlarged prostate follow-up evaluation.  He is currently taking cardura.   09/27/20: Brian Levine today in f/u. He is on doxazosin and had finasteride added at his last visit. He is getting worse and his IPSS is up to 25. He had a Urolift in 2017. His UA is clear. He had trilobar Levine with a small middle lobe and a volume of 7ml on his pre Urolift cystoscopy.   09/06/20: Brian Levine today in f/u after a trial of tamsulosin which he didn't find to be as effective as the doxazosin. His IPSS is 22 back on the doxazosin and his PVR is 128ml. He had increased hesitancy and a reduced stream on the tamsulosin. He has no hematuria or dysuria. He couldn't get a specimen today.    07/31/20: Brian Levine Levine today in f/u. He was last seen in 2017 and had a Urolift on 08/22/15. He remains on doxazosin. He has some increased LUTS with a reduced stream and intermittency. His IPSS is 15. He primarily has issues at night with the reduced stream, straining to void and intermittency. He has no hematuria or dysuria. He reports that his PSA has been ok.     AUA Symptom Score: Less than 50% of the time he has the sensation of not emptying his bladder completely when finished urinating. Less than 50% of the time he has to urinate again fewer than two hours after he has finished urinating. Almost always he has to start and stop again several times when he urinates. Less than 20% of the time he finds it difficult to postpone urination. Almost always he has a weak urinary stream. Almost always he has to push or strain to begin urination. He has to get up to urinate 2 times from the time he goes to bed until the time he gets up in the morning.   Calculated AUA Symptom Score: 22    ALLERGIES: No Allergies    MEDICATIONS: Finasteride 5 mg tablet 1 tablet PO Daily  Aspirin Ec 81 mg tablet, delayed  release Oral  Doxazosin Mesylate 1 PO Daily  Pravastatin Sodium 40 MG Oral Tablet Oral  Vitamin D TABS Oral     GU PSH: Removal of spermatocele - 2011 Remove Hydrocele - 2010       PSH Notes: Cystourethroscopy W/ Insert Permanent Adjust Transprost Implant Single, Cystourethroscopy W/ Insert Permanent Adjust Transprost Implant Additional, Surgery Excision Of Spermatocele With Epididymectomy Right, Surgery Spermatic Cord Excision Of Hydrocele Right, Appendectomy   NON-GU PSH: Appendectomy - 2010     GU PMH: BPH w/LUTS, He did worse on the tamsulosin and his IPSS is up to 22 even on back on doxazosin with a PVR of 19ml. I discussed options including resuming finasteride or considering repeat intervention, possibly with a TURP. I have given him a script for finasteride and he will return in 3 months for a repeat PVR with FR and possible cystoscopy. he was given the TURP handout and I reviewed the risks of the procedure. I reviewd the risks of a TURP including bleeding, infection, incontinence, stricture, need for secondary procedures, ejaculatory and erectile dysfunction, thrombotic events, fluid overload and anesthetic complications. I explained that 95% of men will have relief of the obstructive symptoms and about 70% will have relief of the irritative symptoms. - 09/06/2020, His symptoms have worsened. I discussed options and will switch him  to tamsulosin from doxazosin. He will return in a month for a flowrate and PVR. , - 07/31/2020, Brian Levine with urinary obstruction, - 2017 Incomplete bladder emptying, PVR 130ml. - 09/06/2020 Nocturia, He has worse symptoms at night. - 09/06/2020 Weak Urinary Stream - 09/06/2020, (Stable), - 07/31/2020, Weak urinary stream, - 2017 Hydrocele, He has a moderate right hydrocele without symptoms. - 07/31/2020 History of urolithiasis, History of kidney stones - 2016 Spermatocele of epididymis, Unspec, Spermatocele - 2016 Disorder of male genital organs,  unspecified, Disorder of scrotum - 2014    NON-GU PMH: Encounter for general adult medical examination without abnormal findings, Encounter for preventive health examination - 2017    FAMILY HISTORY: Acute Myocardial Infarction - Father Death In The Family Father - Runs In Family Death In The Family Mother - Runs In Family Family Health Status Number - Runs In Family Kidney Cancer - Runs In Family   SOCIAL HISTORY: Marital Status: Married Preferred Language: English; Race: White Current Smoking Status: Patient does not smoke anymore. Has not smoked since 07/30/1995. Smoked for 30 years.   Tobacco Use Assessment Completed: Used Tobacco in last 30 days? Has never drank.  Drinks 1 caffeinated drink per day.     Notes: Never A Smoker, Retired From Work, Caffeine Use, Occupation:, Alcohol Use   REVIEW OF SYSTEMS:    GU Review Male:   Patient reports burning/ pain with urination, get up at night to urinate, stream starts and stops, trouble starting your stream, and have to strain to urinate . Patient denies frequent urination, hard to postpone urination, leakage of urine, erection problems, and penile pain.  Gastrointestinal (Upper):   Patient denies nausea, vomiting, and indigestion/ heartburn.  Gastrointestinal (Lower):   Patient denies diarrhea and constipation.  Constitutional:   Patient reports night sweats. Patient denies fever, weight loss, and fatigue.  Skin:   Patient denies skin rash/ lesion and itching.  Eyes:   Patient denies blurred vision and double vision.  Ears/ Nose/ Throat:   Patient denies sore throat and sinus problems.  Hematologic/Lymphatic:   Patient reports easy bruising. Patient denies swollen glands.  Cardiovascular:   Patient denies leg swelling and chest pains.  Respiratory:   Patient denies cough and shortness of breath.  Endocrine:   Patient denies excessive thirst.  Musculoskeletal:   Patient denies back pain and joint pain.  Neurological:   Patient denies  headaches and dizziness.  Psychologic:   Patient denies depression and anxiety.   VITAL SIGNS:      09/27/2020 02:31 PM  Weight 180 lb / 81.65 kg  Height 71 in / 180.34 cm  BP 146/79 mmHg  Pulse 70 /min  Temperature 98.4 F / 36.8 C  BMI 25.1 kg/m   MULTI-SYSTEM PHYSICAL EXAMINATION:    Constitutional: Well-nourished. No physical deformities. Normally developed. Good grooming.  Respiratory: Normal breath sounds. No labored breathing, no use of accessory muscles.   Cardiovascular: Regular rate and rhythm. No murmur, no gallop. .      Complexity of Data:  Records Review:   AUA Symptom Score, Previous Patient Records  Urine Test Review:   Urinalysis   05/04/20  PSA  Total PSA 1.44 ng/ml    PROCEDURES:         Flexible Cystoscopy - 52000  Risks, benefits, and some of the potential complications of the procedure were discussed. 46ml of 2% lidocaine jelly was instilled intraurethrally.  Cipro 500mg  given for antibiotic prophylaxis.     Meatus:  Normal size.  Normal location. Normal condition.  Urethra:  No strictures.  External Sphincter:  Normal.  Verumontanum:  Normal.  Prostate:  Obstructing. Enlarged median lobe. Moderate Levine. There is a stone adherent to the right BN that is probably on a Urolift tab.   Bladder Neck:  Non-obstructing.  Ureteral Orifices:  Normal location. Normal size. Normal shape. Effluxed clear urine.  Bladder:  Mild trabeculation. No tumors. Normal mucosa. No stones.      The procedure was well tolerated and there were no complications.         Urinalysis w/Scope Dipstick Dipstick Cont'd Micro  Color: Yellow Bilirubin: Neg mg/dL WBC/hpf: NS (Not Seen)  Appearance: Clear Ketones: Neg mg/dL RBC/hpf: NS (Not Seen)  Specific Gravity: 1.025 Blood: Neg ery/uL Bacteria: NS (Not Seen)  pH: 5.5 Protein: 1+ mg/dL Cystals: NS (Not Seen)  Glucose: Neg mg/dL Urobilinogen: 0.2 mg/dL Casts: NS (Not Seen)    Nitrites: Neg Trichomonas: Not Present     Leukocyte Esterase: Neg leu/uL Mucous: Not Present      Epithelial Cells: 0 - 5/hpf      Yeast: NS (Not Seen)      Sperm: Not Present    ASSESSMENT:      ICD-10 Details  1 GU:   BPH w/LUTS - N40.1 Chronic, Worsening - He has progressive LUTS despite the addition of finasteride and has an obstructing prostate with a stone at the right BN. After reviewing the options he has agreed to cystoscopy with cystolithalopaxy and TURP. I reviewd the risks of a TURP including bleeding, infection, incontinence, stricture, need for secondary procedures, ejaculatory and erectile dysfunction, thrombotic events, fluid overload and anesthetic complications. I explained that 95% of men will have relief of the obstructive symptoms and about 70% will have relief of the irritative symptoms.   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           Document Letter(s):  Created for Patient: Clinical Summary

## 2020-10-23 NOTE — Anesthesia Preprocedure Evaluation (Addendum)
Anesthesia Evaluation  Patient identified by MRN, date of birth, ID band Patient awake    Reviewed: Allergy & Precautions, NPO status , Patient's Chart, lab work & pertinent test results  Airway Mallampati: II  TM Distance: >3 FB Neck ROM: Full    Dental no notable dental hx.    Pulmonary former smoker,    Pulmonary exam normal breath sounds clear to auscultation       Cardiovascular negative cardio ROS Normal cardiovascular exam Rhythm:Regular Rate:Normal     Neuro/Psych negative neurological ROS  negative psych ROS   GI/Hepatic negative GI ROS, Neg liver ROS,   Endo/Other  negative endocrine ROS  Renal/GU negative Renal ROS     Musculoskeletal negative musculoskeletal ROS (+)   Abdominal   Peds  Hematology  (+) Blood dyscrasia, anemia , HLD Pancytopenia    Anesthesia Other Findings BENIGN PROSTATE HYPERPLASIA WITH BLADDER OUTLET OBSTRUCTION AND BLADDER STONE  Reproductive/Obstetrics                            Anesthesia Physical Anesthesia Plan  ASA: II  Anesthesia Plan: General   Post-op Pain Management:    Induction: Intravenous  PONV Risk Score and Plan: 2 and Ondansetron, Dexamethasone and Treatment may vary due to age or medical condition  Airway Management Planned: LMA  Additional Equipment:   Intra-op Plan:   Post-operative Plan: Extubation in OR  Informed Consent: I have reviewed the patients History and Physical, chart, labs and discussed the procedure including the risks, benefits and alternatives for the proposed anesthesia with the patient or authorized representative who has indicated his/her understanding and acceptance.     Dental advisory given  Plan Discussed with: CRNA  Anesthesia Plan Comments:        Anesthesia Quick Evaluation

## 2020-10-24 ENCOUNTER — Ambulatory Visit (HOSPITAL_COMMUNITY): Payer: Medicare HMO | Admitting: Anesthesiology

## 2020-10-24 ENCOUNTER — Observation Stay (HOSPITAL_COMMUNITY)
Admission: RE | Admit: 2020-10-24 | Discharge: 2020-10-25 | Disposition: A | Discharge status: Home / Self Care | Payer: Medicare HMO | Source: Ambulatory Visit | Attending: Urology | Admitting: Urology

## 2020-10-24 ENCOUNTER — Encounter (HOSPITAL_COMMUNITY): Payer: Self-pay | Admitting: Urology

## 2020-10-24 ENCOUNTER — Other Ambulatory Visit: Payer: Self-pay

## 2020-10-24 ENCOUNTER — Encounter (HOSPITAL_COMMUNITY)
Admission: RE | Disposition: A | Discharge status: Home / Self Care | Payer: Self-pay | Source: Ambulatory Visit | Attending: Urology

## 2020-10-24 DIAGNOSIS — Z79899 Other long term (current) drug therapy: Secondary | ICD-10-CM | POA: Insufficient documentation

## 2020-10-24 DIAGNOSIS — Z87891 Personal history of nicotine dependence: Secondary | ICD-10-CM | POA: Insufficient documentation

## 2020-10-24 DIAGNOSIS — R3914 Feeling of incomplete bladder emptying: Secondary | ICD-10-CM | POA: Diagnosis not present

## 2020-10-24 DIAGNOSIS — N210 Calculus in bladder: Secondary | ICD-10-CM | POA: Diagnosis not present

## 2020-10-24 DIAGNOSIS — N400 Enlarged prostate without lower urinary tract symptoms: Secondary | ICD-10-CM | POA: Diagnosis not present

## 2020-10-24 DIAGNOSIS — D72819 Decreased white blood cell count, unspecified: Secondary | ICD-10-CM | POA: Diagnosis not present

## 2020-10-24 DIAGNOSIS — N138 Other obstructive and reflux uropathy: Secondary | ICD-10-CM | POA: Insufficient documentation

## 2020-10-24 DIAGNOSIS — N320 Bladder-neck obstruction: Secondary | ICD-10-CM | POA: Diagnosis not present

## 2020-10-24 DIAGNOSIS — Z7982 Long term (current) use of aspirin: Secondary | ICD-10-CM | POA: Diagnosis not present

## 2020-10-24 DIAGNOSIS — N401 Enlarged prostate with lower urinary tract symptoms: Secondary | ICD-10-CM | POA: Insufficient documentation

## 2020-10-24 DIAGNOSIS — D696 Thrombocytopenia, unspecified: Secondary | ICD-10-CM | POA: Diagnosis not present

## 2020-10-24 HISTORY — PX: TRANSURETHRAL RESECTION OF PROSTATE: SHX73

## 2020-10-24 HISTORY — PX: CYSTOSCOPY WITH LITHOLAPAXY: SHX1425

## 2020-10-24 SURGERY — TURP (TRANSURETHRAL RESECTION OF PROSTATE)
Anesthesia: General

## 2020-10-24 MED ORDER — BISACODYL 10 MG RE SUPP: 10.0000 mg | Freq: Every day | RECTAL | Status: DC | PRN

## 2020-10-24 MED ORDER — MIDAZOLAM HCL 5 MG/5ML IJ SOLN
INTRAMUSCULAR | Status: DC | PRN
  Administered 2020-10-24: 2 mg via INTRAVENOUS

## 2020-10-24 MED ORDER — EPHEDRINE SULFATE-NACL 50-0.9 MG/10ML-% IV SOSY
PREFILLED_SYRINGE | INTRAVENOUS | Status: DC | PRN
  Administered 2020-10-24 (×5): 10 mg via INTRAVENOUS

## 2020-10-24 MED ORDER — LIDOCAINE 2% (20 MG/ML) 5 ML SYRINGE
INTRAMUSCULAR | Status: DC | PRN
Start: 2020-10-24 — End: 2020-10-24
  Administered 2020-10-24: 60 mg via INTRAVENOUS

## 2020-10-24 MED ORDER — ACETAMINOPHEN 500 MG PO TABS
1000.0000 mg | ORAL_TABLET | Freq: Once | ORAL | Status: AC
  Administered 2020-10-24: 1000 mg via ORAL
  Filled 2020-10-24: qty 2

## 2020-10-24 MED ORDER — CHLORHEXIDINE GLUCONATE 0.12 % MT SOLN
15.0000 mL | Freq: Once | OROMUCOSAL | Status: AC
  Administered 2020-10-24: 15 mL via OROMUCOSAL

## 2020-10-24 MED ORDER — LIDOCAINE 2% (20 MG/ML) 5 ML SYRINGE
INTRAMUSCULAR | Status: AC
  Filled 2020-10-24: qty 5

## 2020-10-24 MED ORDER — DEXAMETHASONE SODIUM PHOSPHATE 10 MG/ML IJ SOLN
INTRAMUSCULAR | Status: AC
  Filled 2020-10-24: qty 1

## 2020-10-24 MED ORDER — DEXAMETHASONE SODIUM PHOSPHATE 10 MG/ML IJ SOLN
INTRAMUSCULAR | Status: DC | PRN
  Administered 2020-10-24: 10 mg via INTRAVENOUS

## 2020-10-24 MED ORDER — CHLORHEXIDINE GLUCONATE CLOTH 2 % EX PADS
6.0000 | MEDICATED_PAD | Freq: Every day | CUTANEOUS | Status: DC
  Administered 2020-10-24: 6 via TOPICAL

## 2020-10-24 MED ORDER — ONDANSETRON HCL 4 MG/2ML IJ SOLN: 4.0000 mg | INTRAMUSCULAR | Status: DC | PRN

## 2020-10-24 MED ORDER — SODIUM CHLORIDE 0.9 % IR SOLN
3000.0000 mL | Status: DC
  Administered 2020-10-24: 3000 mL

## 2020-10-24 MED ORDER — SODIUM CHLORIDE 0.9 % IR SOLN
Status: DC | PRN
  Administered 2020-10-24: 24000 mL

## 2020-10-24 MED ORDER — ORAL CARE MOUTH RINSE: 15.0000 mL | Freq: Once | OROMUCOSAL | Status: AC

## 2020-10-24 MED ORDER — FLEET ENEMA 7-19 GM/118ML RE ENEM: 1.0000 | ENEMA | Freq: Once | RECTAL | Status: DC | PRN

## 2020-10-24 MED ORDER — EPHEDRINE 5 MG/ML INJ
INTRAVENOUS | Status: AC
  Filled 2020-10-24: qty 10

## 2020-10-24 MED ORDER — PHENYLEPHRINE 40 MCG/ML (10ML) SYRINGE FOR IV PUSH (FOR BLOOD PRESSURE SUPPORT)
PREFILLED_SYRINGE | INTRAVENOUS | Status: AC
  Filled 2020-10-24: qty 10

## 2020-10-24 MED ORDER — FINASTERIDE 5 MG PO TABS
5.0000 mg | ORAL_TABLET | Freq: Every evening | ORAL | Status: DC
  Administered 2020-10-24: 5 mg via ORAL
  Filled 2020-10-24: qty 1

## 2020-10-24 MED ORDER — PROPOFOL 10 MG/ML IV BOLUS
INTRAVENOUS | Status: DC | PRN
  Administered 2020-10-24: 200 mg via INTRAVENOUS

## 2020-10-24 MED ORDER — PROPOFOL 10 MG/ML IV BOLUS
INTRAVENOUS | Status: AC
  Filled 2020-10-24: qty 40

## 2020-10-24 MED ORDER — CEFAZOLIN SODIUM-DEXTROSE 2-4 GM/100ML-% IV SOLN
2.0000 g | INTRAVENOUS | Status: AC
  Administered 2020-10-24: 2 g via INTRAVENOUS
  Filled 2020-10-24: qty 100

## 2020-10-24 MED ORDER — OXYCODONE HCL 5 MG PO TABS
ORAL_TABLET | ORAL | Status: AC
  Filled 2020-10-24: qty 1

## 2020-10-24 MED ORDER — SENNOSIDES-DOCUSATE SODIUM 8.6-50 MG PO TABS: 1.0000 | ORAL_TABLET | Freq: Every evening | ORAL | Status: DC | PRN

## 2020-10-24 MED ORDER — OXYCODONE HCL 5 MG PO TABS
5.0000 mg | ORAL_TABLET | ORAL | Status: DC | PRN
  Administered 2020-10-24: 5 mg via ORAL

## 2020-10-24 MED ORDER — ACETAMINOPHEN 325 MG PO TABS: 650.0000 mg | ORAL_TABLET | ORAL | Status: DC | PRN

## 2020-10-24 MED ORDER — ONDANSETRON HCL 4 MG/2ML IJ SOLN
INTRAMUSCULAR | Status: DC | PRN
  Administered 2020-10-24: 4 mg via INTRAVENOUS

## 2020-10-24 MED ORDER — HYDROMORPHONE HCL 1 MG/ML IJ SOLN
0.5000 mg | INTRAMUSCULAR | Status: DC | PRN
  Administered 2020-10-24: 0.5 mg via INTRAVENOUS
  Filled 2020-10-24: qty 1

## 2020-10-24 MED ORDER — POTASSIUM CHLORIDE IN NACL 20-0.45 MEQ/L-% IV SOLN
INTRAVENOUS | Status: DC
  Filled 2020-10-24 (×2): qty 1000

## 2020-10-24 MED ORDER — FENTANYL CITRATE (PF) 100 MCG/2ML IJ SOLN
25.0000 ug | INTRAMUSCULAR | Status: DC | PRN
  Administered 2020-10-24: 50 ug via INTRAVENOUS

## 2020-10-24 MED ORDER — FENTANYL CITRATE (PF) 250 MCG/5ML IJ SOLN
INTRAMUSCULAR | Status: AC
  Filled 2020-10-24: qty 5

## 2020-10-24 MED ORDER — ONDANSETRON HCL 4 MG/2ML IJ SOLN
INTRAMUSCULAR | Status: AC
  Filled 2020-10-24: qty 2

## 2020-10-24 MED ORDER — FENTANYL CITRATE (PF) 100 MCG/2ML IJ SOLN
INTRAMUSCULAR | Status: AC
  Filled 2020-10-24: qty 2

## 2020-10-24 MED ORDER — MIDAZOLAM HCL 2 MG/2ML IJ SOLN
INTRAMUSCULAR | Status: AC
  Filled 2020-10-24: qty 2

## 2020-10-24 MED ORDER — OXYBUTYNIN CHLORIDE 5 MG PO TABS
5.0000 mg | ORAL_TABLET | Freq: Three times a day (TID) | ORAL | Status: DC | PRN
Start: 2020-10-24 — End: 2020-10-25

## 2020-10-24 MED ORDER — PRAVASTATIN SODIUM 40 MG PO TABS
40.0000 mg | ORAL_TABLET | Freq: Every day | ORAL | Status: DC
  Administered 2020-10-24: 40 mg via ORAL
  Filled 2020-10-24: qty 1

## 2020-10-24 MED ORDER — AMISULPRIDE (ANTIEMETIC) 5 MG/2ML IV SOLN: 10.0000 mg | Freq: Once | INTRAVENOUS | Status: DC | PRN

## 2020-10-24 MED ORDER — FENTANYL CITRATE (PF) 250 MCG/5ML IJ SOLN
INTRAMUSCULAR | Status: DC | PRN
  Administered 2020-10-24 (×2): 50 ug via INTRAVENOUS

## 2020-10-24 MED ORDER — LACTATED RINGERS IV SOLN: INTRAVENOUS | Status: DC

## 2020-10-24 MED ORDER — ONDANSETRON HCL 4 MG/2ML IJ SOLN: 4.0000 mg | Freq: Once | INTRAMUSCULAR | Status: DC | PRN

## 2020-10-24 SURGICAL SUPPLY — 22 items
BAG URINE DRAIN 2000ML AR STRL (UROLOGICAL SUPPLIES) ×2 IMPLANT
BAG URO CATCHER STRL LF (MISCELLANEOUS) ×2 IMPLANT
CATH FOLEY 3WAY 30CC 22FR (CATHETERS) ×2 IMPLANT
CATH URET 5FR 28IN OPEN ENDED (CATHETERS) IMPLANT
CLOTH BEACON ORANGE TIMEOUT ST (SAFETY) ×2 IMPLANT
DRAPE FOOT SWITCH (DRAPES) ×2 IMPLANT
ELECT REM PT RETURN 15FT ADLT (MISCELLANEOUS) ×2 IMPLANT
GLOVE SURG POLYISO LF SZ8 (GLOVE) IMPLANT
GOWN STRL REUS W/TWL XL LVL3 (GOWN DISPOSABLE) ×2 IMPLANT
HOLDER FOLEY CATH W/STRAP (MISCELLANEOUS) ×2 IMPLANT
KIT TURNOVER KIT A (KITS) ×2 IMPLANT
LASER FIB FLEXIVA PULSE ID 550 (Laser) IMPLANT
LASER FIB FLEXIVA PULSE ID 910 (Laser) IMPLANT
LOOP CUT BIPOLAR 24F LRG (ELECTROSURGICAL) ×4 IMPLANT
MANIFOLD NEPTUNE II (INSTRUMENTS) ×2 IMPLANT
PACK CYSTO (CUSTOM PROCEDURE TRAY) ×2 IMPLANT
PENCIL SMOKE EVACUATOR (MISCELLANEOUS) IMPLANT
SYR 30ML LL (SYRINGE) IMPLANT
SYR TOOMEY IRRIG 70ML (MISCELLANEOUS) ×2
SYRINGE TOOMEY IRRIG 70ML (MISCELLANEOUS) ×1 IMPLANT
TUBING CONNECTING 10 (TUBING) ×2 IMPLANT
TUBING UROLOGY SET (TUBING) ×2 IMPLANT

## 2020-10-24 NOTE — Interval H&P Note (Signed)
History and Physical Interval Note:  he is voiding better with the finasteride.   10/24/2020 7:21 AM  Brian Levine  has presented today for surgery, with the diagnosis of BENIGN PROSTATE HYPERPLASIA WITH BLADDER OUTLET OBSTRUCTION AND BLADDER STONE.  The various methods of treatment have been discussed with the patient and family. After consideration of risks, benefits and other options for treatment, the patient has consented to  Procedure(s) with comments: TRANSURETHRAL RESECTION OF THE PROSTATE (TURP) (N/A) CYSTOSCOPY WITH LITHOLAPAXY (N/A) - REQUESTING 1 HR FOR CASE as a surgical intervention.  The patient's history has been reviewed, patient examined, no change in status, stable for surgery.  I have reviewed the patient's chart and labs.  Questions were answered to the patient's satisfaction.     Irine Seal

## 2020-10-24 NOTE — Op Note (Signed)
Procedure: 1.  Cystolitholapaxy for 1.5 cm stone. 2.  TURP.  Preop diagnosis: Approximately 1.5 cm bladder stone and BPH with bladder outlet obstruction.  Postop diagnosis: Same.  Surgeon: Dr. Irine Seal.  Anesthesia: General.  Drain: 24 French three-way Foley catheter.  Specimen: Stone and TUR chips with  UroLift tabs.  EBL: 150 mL.  Complications: None.  Indications: Brian Levine is a 75 year old male with a history of prior UroLift procedure who presented with recurrent urinary tract symptoms.  He was found to have a stone at the bladder neck which appeared to be an encrusted UroLift tab on the right.  He also had BPH with bladder outlet obstruction despite the UroLift tabs.  He had been placed on tamsulosin and recently finasteride was added which is helped his symptoms.  Procedure: He was taken operating room was given antibiotic.  A general anesthetic was induced.  Was placed in lithotomy position and fitted with PAS hose.  His perineum and genitalia were prepped with Betadine solution he was draped in usual sterile fashion.  Cystoscopy was performed using a 23 [place] scope and 30 degree lens.  Examination revealed a normal urethra.  The external sphincter was intact.  The prostatic urethra had trilobar hyperplasia with primarily lateral lobe hyperplasia but a small middle lobe.  On the right just inside the bladder neck was a jack stone type stone and it appeared to be adherent to a UroLift tab.  He was proximal 1/2 cm.  The bladder wall had mild trabeculation.  Ureteral orifices were unremarkable.  No mucosal lesions were identified.  The cystoscope was removed and the urethra was calibrated 32 [place] with [employer] sounds and a continuous-flow resectoscope sheath was placed with the aid of visual obturator.  The [place] handle was then used with a bipolar loop and the 30 degree lens.  The stone was released from the bladder neck into the bladder.  It was too large to remove  intact.  Resectoscope was removed and the cystoscope was reinserted and fitted with a mechanical lithotrite.  The stone was broken into manageable fragments which were then removed using the lithotrite.  The resectoscope was then reinserted and TURP was performed.  Initially the middle lobe was resected from bladder neck out to alongside the verumontanum.  The UroLift tab on the right was released as well the stone was no longer attached.  Surprisingly there was an obturator reflex associated with dividing the UroLift string but no significant injury was identified on inspection.  The left lobe of the prostate was then resected from bladder neck to apex out to the capsular fibers.  Additional UroLift tabs removed on the left.  The right lobe the prostate was then resected from bladder neck to apex out to the capsular fibers.  And 2 additional UroLift tabs were released here as well another 1 of those also was associated with an obturator reflex.  Once the bulk of the prostate been resected and the chips were removed, residual apical and anterior tissue was removed.  Final chip removal was performed along with hemostasis.  Final inspection revealed no retained tissue, no retained UroLift tabs and intact ureters and external sphincter.  Scope was removed and pressure on the bladder produced an excellent stream.  A 22 French three-way Foley catheter was placed to aid of a catheter guide.  The balloon was filled with 30 mL of sterile fluid.  The cath was irrigated with clear return in place to continuous irrigation and straight drainage.  Patient was taken down from lithotomy position, his anesthetic was reversed and he was moved recovery in stable condition.  There were no complications.

## 2020-10-24 NOTE — Anesthesia Procedure Notes (Signed)
Procedure Name: LMA Insertion Date/Time: 10/24/2020 7:32 AM Performed by: Pilar Grammes, CRNA Pre-anesthesia Checklist: Patient identified, Emergency Drugs available, Suction available, Patient being monitored and Timeout performed Patient Re-evaluated:Patient Re-evaluated prior to induction Oxygen Delivery Method: Circle system utilized Preoxygenation: Pre-oxygenation with 100% oxygen Induction Type: IV induction Ventilation: Mask ventilation without difficulty LMA: LMA with gastric port inserted LMA Size: 5.0 Number of attempts: 1 Tube secured with: Tape

## 2020-10-24 NOTE — Transfer of Care (Signed)
Immediate Anesthesia Transfer of Care Note  Patient: Brian Levine  Procedure(s) Performed: TRANSURETHRAL RESECTION OF THE PROSTATE (TURP) (N/A ) CYSTOSCOPY WITH LITHOLAPAXY (N/A )  Patient Location: PACU  Anesthesia Type:General  Level of Consciousness: sedated and drowsy  Airway & Oxygen Therapy: Patient connected to face mask oxygen  Post-op Assessment: Report given to RN and Post -op Vital signs reviewed and stable  Post vital signs: stable  Last Vitals:  Vitals Value Taken Time  BP 130/70 10/24/20 0915  Temp    Pulse 70 10/24/20 0915  Resp 13 10/24/20 0914  SpO2 100 % 10/24/20 0915  Vitals shown include unvalidated device data.  Last Pain:  Vitals:   10/24/20 0550  TempSrc:   PainSc: 0-No pain         Complications: No complications documented.

## 2020-10-25 ENCOUNTER — Encounter (HOSPITAL_COMMUNITY): Payer: Self-pay | Admitting: Urology

## 2020-10-25 DIAGNOSIS — Z7982 Long term (current) use of aspirin: Secondary | ICD-10-CM | POA: Diagnosis not present

## 2020-10-25 DIAGNOSIS — Z79899 Other long term (current) drug therapy: Secondary | ICD-10-CM | POA: Diagnosis not present

## 2020-10-25 DIAGNOSIS — Z87891 Personal history of nicotine dependence: Secondary | ICD-10-CM | POA: Diagnosis not present

## 2020-10-25 DIAGNOSIS — N210 Calculus in bladder: Secondary | ICD-10-CM | POA: Diagnosis not present

## 2020-10-25 DIAGNOSIS — R3914 Feeling of incomplete bladder emptying: Secondary | ICD-10-CM | POA: Diagnosis not present

## 2020-10-25 DIAGNOSIS — N138 Other obstructive and reflux uropathy: Secondary | ICD-10-CM | POA: Diagnosis not present

## 2020-10-25 DIAGNOSIS — N401 Enlarged prostate with lower urinary tract symptoms: Secondary | ICD-10-CM | POA: Diagnosis not present

## 2020-10-25 NOTE — Discharge Summary (Signed)
Physician Discharge Summary  Patient ID: Brian Levine MRN: [id number] DOB/AGE: 1946/06/25 75 y.o.  Admit date: 10/24/2020 Discharge date: 10/25/2020  Admission Diagnoses:  BPH with obstruction/lower urinary tract symptoms  Discharge Diagnoses:  Principal Problem:   BPH with obstruction/lower urinary tract symptoms Active Problems:   Bladder stone   Past Medical History:  Diagnosis Date  . BPH (benign prostatic hyperplasia) 2004  . Constipation   . Other pancytopenia ([place]) 05/04/2013  . Thrombocytopenia, unspecified ([place]) 08/17/2013  . Weight loss 05/04/2013    Surgeries: Procedure(s): TRANSURETHRAL RESECTION OF THE PROSTATE (TURP) CYSTOSCOPY WITH LITHOLAPAXY on 10/24/2020   Consultants (if any):   Discharged Condition: Improved  Hospital Course: Brian Levine is an 75 y.o. male who was admitted 10/24/2020 with a diagnosis of BPH with obstruction/lower urinary tract symptoms and went to the operating room on 10/24/2020 and underwent the above named procedures.  His urine was light pink this morning and the foley was removed.  He has voided without difficulty but had some initial hematuria.  He was monitored and continued to do well and was discharged in the afternoon of POD#1.  He was given perioperative antibiotics:  Anti-infectives (From admission, onward)   Start     Dose/Rate Route Frequency Ordered Stop   10/24/20 0545  ceFAZolin (ANCEF) IVPB 2g/100 mL premix        2 g 200 mL/hr over 30 Minutes Intravenous 30 min pre-op 10/24/20 0526 10/24/20 0737    .  He was given sequential compression devices, for DVT prophylaxis.  He benefited maximally from the hospital stay and there were no complications.    Recent vital signs:  Vitals:   10/25/20 0522 10/25/20 1336  BP: 129/67 131/64  Pulse: 60 61  Resp: 18 20  Temp: 98.4 F (36.9 C) 98 F (36.7 C)  SpO2: 99% 100%    Recent laboratory studies:  Lab Results  Component Value Date   HGB 12.5 (L) 10/19/2020   HGB 12.9  (L) 08/18/2015   HGB 13.9 08/17/2013   Lab Results  Component Value Date   WBC 3.9 (L) 10/19/2020   PLT 129 (L) 10/19/2020   No results found for: INR Lab Results  Component Value Date   NA 140 08/17/2013   K 4.5 08/17/2013   CL 103 08/03/2009   CO2 27 08/17/2013   BUN 12.1 08/17/2013   CREATININE 1.1 08/17/2013   GLUCOSE 98 08/17/2013    Discharge Medications:   Allergies as of 10/25/2020   No Known Allergies     Medication List    TAKE these medications   aspirin EC 81 MG tablet Take 81 mg by mouth daily.   cholecalciferol 1000 units tablet Commonly known as: VITAMIN D Take 1,000 Units by mouth daily.   finasteride 5 MG tablet Commonly known as: PROSCAR Take 5 mg by mouth every evening.   pravastatin 40 MG tablet Commonly known as: PRAVACHOL Take 40 mg by mouth daily at 6 PM.       Diagnostic Studies: No results found.  Disposition: Discharge disposition: 01-Home or Self Care          Follow-up Information    Karen Kays, NP Follow up on 11/07/2020.   Specialty: Nurse Practitioner Why: [address] information: [address] [place] [address] [phone]                Signed: Irine Seal 10/25/2020, 4:41 PM

## 2020-10-25 NOTE — Discharge Instructions (Signed)
Transurethral Resection of the Prostate, Care After °This sheet gives you information about how to care for yourself after your procedure. Your health care provider may also give you more specific instructions. If you have problems or questions, contact your health care provider. °What can I expect after the procedure? °After the procedure, it is common to have: °· Mild pain in your lower abdomen. °· Soreness or mild discomfort in your penis from having the catheter inserted during the procedure. °· A feeling of urgency when you need to urinate. °· A small amount of blood in your urine. You may notice some small blood clots in your urine. These are normal. °Follow these instructions at home: °Medicines °· Take over-the-counter and prescription medicines only as told by your health care provider. °· If you were prescribed an antibiotic medicine, take it as told by your health care provider. Do not stop taking the antibiotic even if you start to feel better. °· Ask your health care provider if the medicine prescribed to you: °? Requires you to avoid driving or using heavy machinery. °? Can cause constipation. You may need to take actions to prevent or treat constipation, such as: °§ Take over-the-counter or prescription medicines. °§ Eat foods that are high in fiber, such as fresh fruits and vegetables, whole grains, and beans. °§ Limit foods that are high in fat and processed sugars, such as fried or sweet foods. °· Do not drive for 24 hours if you were given a sedative during your procedure. °Activity °· Return to your normal activities as told by your health care provider. Ask your health care provider what activities are safe for you. °· Do not lift anything that is heavier than 10 lb (4.5 kg), or the limit that you are told, for 3 weeks after the procedure or until your health care provider says that it is safe. °· Avoid intense physical activity for as long as told by your health care provider. °· Avoid sitting  for a long time without moving. Get up and move around one or more times every few hours. This helps to prevent blood clots. You may increase your physical activity gradually as you start to feel better.   °Lifestyle °· Do not drink alcohol for as long as told by your health care provider. This is especially important if you are taking prescription pain medicines. °· Do not engage in sexual activity until your health care provider says that you can do this. °General instructions °· Do not take baths, swim, or use a hot tub until your health care provider approves. °· Drink enough fluid to keep your urine pale yellow. °· Urinate as soon as you feel the need to. Do not try to hold your urine for long periods of time. °· If your health care provider approves, you may take a stool softener for 2-3 weeks to prevent you from straining to have a bowel movement. °· Wear compression stockings as told by your health care provider. These stockings help to prevent blood clots and reduce swelling in your legs. °· Keep all follow-up visits as told by your health care provider. This is important.   °Contact a health care provider if you have: °· Difficulty urinating. °· A fever. °· Pain that gets worse or does not improve with medicine. °· Blood in your urine that does not go away after 1 week of resting and drinking more fluids. °· Swelling in your penis or testicles. °Get help right away if: °· You   are unable to urinate. °· You are having more blood clots in your urine instead of fewer. °· You have: °? Large blood clots. °? A lot of blood in your urine. °? Pain in your back or lower abdomen. °? Pain or swelling in your legs. °? Chills and you are shaking. °? Difficulty breathing or shortness of breath. °Summary °· After the procedure, it is common to have a small amount of blood in your urine. °· Avoid heavy lifting and intense physical activity for as long as told by your health care provider. °· Urinate as soon as you feel the  need to. Do not try to hold your urine for long periods of time. °· Keep all follow-up visits as told by your health care provider. This is important. °This information is not intended to replace advice given to you by your health care provider. Make sure you discuss any questions you have with your health care provider. °Document Revised: 11/04/2018 Document Reviewed: 04/15/2018 °Elsevier Patient Education © 2021 [employer] ° °

## 2020-10-25 NOTE — Plan of Care (Signed)

## 2020-10-25 NOTE — Plan of Care (Signed)
  Problem: Health Behavior/Discharge Planning: Goal: Ability to manage health-related needs will improve Outcome: Progressing   Problem: Clinical Measurements: Goal: Ability to maintain clinical measurements within normal limits will improve Outcome: Progressing   Problem: Clinical Measurements: Goal: Will remain free from infection Outcome: Progressing   Problem: Nutrition: Goal: Adequate nutrition will be maintained Outcome: Progressing   Problem: Activity: Goal: Risk for activity intolerance will decrease Outcome: Progressing   Problem: Elimination: Goal: Will not experience complications related to bowel motility Outcome: Progressing   Problem: Elimination: Goal: Will not experience complications related to urinary retention Outcome: Progressing   Problem: Pain Managment: Goal: General experience of comfort will improve Outcome: Progressing

## 2020-10-25 NOTE — Progress Notes (Signed)
Discharge instructions provided to pt with wife present. All questions answered.

## 2020-10-25 NOTE — Anesthesia Postprocedure Evaluation (Signed)
Anesthesia Post Note  Patient: Brian Levine  Procedure(s) Performed: TRANSURETHRAL RESECTION OF THE PROSTATE (TURP) (N/A ) CYSTOSCOPY WITH LITHOLAPAXY (N/A )     Patient location during evaluation: PACU Anesthesia Type: General Level of consciousness: awake Pain management: pain level controlled Vital Signs Assessment: post-procedure vital signs reviewed and stable Respiratory status: spontaneous breathing, nonlabored ventilation, respiratory function stable and patient connected to nasal cannula oxygen Cardiovascular status: blood pressure returned to baseline and stable Postop Assessment: no apparent nausea or vomiting Anesthetic complications: no   No complications documented.  Last Vitals:  Vitals:   10/25/20 0127 10/25/20 0522  BP: 121/67 129/67  Pulse: (!) 58 60  Resp: 18 18  Temp: 36.8 C 36.9 C  SpO2: 98% 99%    Last Pain:  Vitals:   10/25/20 0630  TempSrc:   PainSc: 0-No pain                 Leslieanne Cobarrubias P Braiden Presutti

## 2020-10-25 NOTE — Progress Notes (Signed)
Patient ID: Brian Levine, male   DOB: June 27, 1946, 75 y.o.   MRN: [id number]  He is doing well without complaints.  Urine is light pink with CBI off.  BP 129/67 (BP Location: Right Arm)   Pulse 60   Temp 98.4 F (36.9 C) (Oral)   Resp 18   Ht 6' (1.829 m)   Wt 85.7 kg   SpO2 99%   BMI 25.63 kg/m   Imp: Doing well s/p TURP.  Plan: D/C IV and Foley.   Consider discharge later today.

## 2020-10-26 LAB — SURGICAL PATHOLOGY

## 2020-10-27 DIAGNOSIS — R351 Nocturia: Secondary | ICD-10-CM | POA: Diagnosis not present

## 2020-10-27 DIAGNOSIS — N401 Enlarged prostate with lower urinary tract symptoms: Secondary | ICD-10-CM | POA: Diagnosis not present

## 2020-11-05 ENCOUNTER — Encounter (HOSPITAL_COMMUNITY): Payer: Self-pay

## 2020-11-05 ENCOUNTER — Emergency Department (HOSPITAL_COMMUNITY)
Admission: EM | Admit: 2020-11-05 | Discharge: 2020-11-05 | Disposition: A | Discharge status: Home / Self Care | Payer: Medicare HMO | Attending: Emergency Medicine | Admitting: Emergency Medicine

## 2020-11-05 DIAGNOSIS — Z7982 Long term (current) use of aspirin: Secondary | ICD-10-CM | POA: Insufficient documentation

## 2020-11-05 DIAGNOSIS — R1084 Generalized abdominal pain: Secondary | ICD-10-CM | POA: Diagnosis not present

## 2020-11-05 DIAGNOSIS — Z87891 Personal history of nicotine dependence: Secondary | ICD-10-CM | POA: Insufficient documentation

## 2020-11-05 DIAGNOSIS — R109 Unspecified abdominal pain: Secondary | ICD-10-CM | POA: Diagnosis present

## 2020-11-05 LAB — CBC WITH DIFFERENTIAL/PLATELET
Abs Immature Granulocytes: 0.01 10*3/uL (ref 0.00–0.07)
Basophils Absolute: 0 10*3/uL (ref 0.0–0.1)
Basophils Relative: 0 %
Eosinophils Absolute: 0.2 10*3/uL (ref 0.0–0.5)
Eosinophils Relative: 3 %
HCT: 41.4 % (ref 39.0–52.0)
Hemoglobin: 14.4 g/dL (ref 13.0–17.0)
Immature Granulocytes: 0 %
Lymphocytes Relative: 23 %
Lymphs Abs: 1.2 10*3/uL (ref 0.7–4.0)
MCH: 31.2 pg (ref 26.0–34.0)
MCHC: 34.8 g/dL (ref 30.0–36.0)
MCV: 89.6 fL (ref 80.0–100.0)
Monocytes Absolute: 0.3 10*3/uL (ref 0.1–1.0)
Monocytes Relative: 7 %
Neutro Abs: 3.5 10*3/uL (ref 1.7–7.7)
Neutrophils Relative %: 67 %
Platelets: 146 10*3/uL — ABNORMAL LOW (ref 150–400)
RBC: 4.62 MIL/uL (ref 4.22–5.81)
RDW: 12.7 % (ref 11.5–15.5)
WBC: 5.2 10*3/uL (ref 4.0–10.5)
nRBC: 0 % (ref 0.0–0.2)

## 2020-11-05 LAB — URINALYSIS, ROUTINE W REFLEX MICROSCOPIC
Bilirubin Urine: NEGATIVE
Glucose, UA: NEGATIVE mg/dL
Ketones, ur: NEGATIVE mg/dL
Leukocytes,Ua: NEGATIVE
Nitrite: NEGATIVE
Protein, ur: NEGATIVE mg/dL
RBC / HPF: >50 RBC/hpf — ABNORMAL HIGH (ref 0–5)
Specific Gravity, Urine: 1.005 (ref 1.005–1.030)
pH: 7 (ref 5.0–8.0)

## 2020-11-05 LAB — COMPREHENSIVE METABOLIC PANEL
ALT: 19 U/L (ref 0–44)
AST: 19 U/L (ref 15–41)
Albumin: 4.4 g/dL (ref 3.5–5.0)
Alkaline Phosphatase: 59 U/L (ref 38–126)
Anion gap: 8 (ref 5–15)
BUN: 13 mg/dL (ref 8–23)
CO2: 27 mmol/L (ref 22–32)
Calcium: 9.6 mg/dL (ref 8.9–10.3)
Chloride: 103 mmol/L (ref 98–111)
Creatinine, Ser: 1.01 mg/dL (ref 0.61–1.24)
GFR, Estimated: >60 mL/min (ref >60)
Glucose, Bld: 114 mg/dL — ABNORMAL HIGH (ref 70–99)
Potassium: 3.9 mmol/L (ref 3.5–5.1)
Sodium: 138 mmol/L (ref 135–145)
Total Bilirubin: 1.3 mg/dL — ABNORMAL HIGH (ref 0.3–1.2)
Total Protein: 7.2 g/dL (ref 6.5–8.1)

## 2020-11-05 LAB — LIPASE, BLOOD: Lipase: 37 U/L (ref 11–51)

## 2020-11-05 NOTE — ED Provider Notes (Signed)
[facility] DEPT Provider Note   CSN: [id number] Arrival date & time: 11/05/20  0913    History Chief Complaint  Patient presents with  . Abdominal Pain    Brian Levine is Levine 75 y.o. male with past medical history significant for BPH, thrombocytopenia recent TURP 10 days ago who presents for evaluation of abdominal pain.  Began yesterday.  Pain intermittent in nature.  Not associated with food intake.  States he did feel constipated yesterday which he took Levine laxative. Subsequently he feels like he is "cleaned out."  He denies any melena or bright blood per rectum.  He denies any difficulty with urination.  States he actually has Levine significantly better stream since his recent procedure.  He continues to have gross hematuria.  States he has been followed by [facility] and states they are aware of this and it is "normal".  He is not on any anticoagulation.  He rates his current pain Levine 3/10.  Describes as aching and sharp.  Pain located diffusely to abdomen worse to right side. Does not radiate into back. He denies fever, chills, nausea, vomiting, chest pain, shortness of breath, back pain, flank pain, paresthesias or weakness.  Denies additional aggravating or alleviating factors.  No prior diagnosis of AAA or dissections. Last BM yesterday evening. Urinating here on arrival wo difficulty.  History obtained from patient and past medical records.  No interpreter used    HPI     Past Medical History:  Diagnosis Date  . BPH (benign prostatic hyperplasia) 2004  . Constipation   . Other pancytopenia ([place]) 05/04/2013  . Thrombocytopenia, unspecified ([place]) 08/17/2013  . Weight loss 05/04/2013    Patient Active Problem List   Diagnosis Date Noted  . Bladder stone 10/25/2020  . BPH with obstruction/lower urinary tract symptoms 10/24/2020  . 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 Leukopenia 08/17/2013  .  Thrombocytopenia, unspecified ([place]) 08/17/2013  . Weight loss 05/04/2013  . Other pancytopenia ([place]) 05/04/2013    Past Surgical History:  Procedure Laterality Date  . APPENDECTOMY    . COLONOSCOPY WITH PROPOFOL N/Levine 07/17/2015   Procedure: COLONOSCOPY WITH PROPOFOL;  Surgeon: Garlan Fair, MD;  Location: [facility];  Service: Endoscopy;  Laterality: N/Levine;  . CYSTOSCOPY WITH INSERTION OF UROLIFT N/Levine 08/22/2015   Procedure: CYSTOSCOPY WITH INSERTION OF UROLIFT;  Surgeon: Irine Seal, MD;  Location: [facility];  Service: Urology;  Laterality: N/Levine;  . CYSTOSCOPY WITH LITHOLAPAXY N/Levine 10/24/2020   Procedure: CYSTOSCOPY WITH LITHOLAPAXY;  Surgeon: Irine Seal, MD;  Location: [facility];  Service: Urology;  Laterality: N/Levine;  REQUESTING 1 HR FOR CASE  . EYE SURGERY     right cataract surgery with lens implant  . TESTICLE SURGERY Right 2011   hydrocele  . TRANSURETHRAL RESECTION OF PROSTATE N/Levine 10/24/2020   Procedure: TRANSURETHRAL RESECTION OF THE PROSTATE (TURP);  Surgeon: Irine Seal, MD;  Location: [facility];  Service: Urology;  Laterality: N/Levine;       History reviewed. No pertinent family history.  Social History   Tobacco Use  . Smoking status: Former Smoker    Packs/day: 1.50    Years: 30.00    Pack years: 45.00    Quit date: 07/30/1995    Years since quitting: 25.2  . Smokeless tobacco: Never Used  Vaping Use  . Vaping Use: Never used  Substance Use Topics  . Alcohol use: No  . Drug use: No  Home Medications Prior to Admission medications   Medication Sig Start Date End Date Taking? Authorizing Provider  aspirin EC 81 MG tablet Take 81 mg by mouth daily.    [provider]  cholecalciferol (VITAMIN D) 1000 UNITS tablet Take 1,000 Units by mouth daily.    [provider]  finasteride (PROSCAR) 5 MG tablet Take 5 mg by mouth every evening.    [provider]  pravastatin (PRAVACHOL) 40 MG tablet Take 40 mg by mouth daily at 6 PM.     [provider]    Allergies    Patient has no known allergies.  Review of Systems   Review of Systems  Constitutional: Negative.   HENT: Negative.   Respiratory: Negative.   Cardiovascular: Negative.   Gastrointestinal: Positive for abdominal pain. Negative for abdominal distention, anal bleeding, blood in stool, nausea, rectal pain and vomiting.  Genitourinary: Positive for hematuria (x 10 days). Negative for decreased urine volume, difficulty urinating, dysuria, enuresis, frequency, penile discharge, penile pain, penile swelling, scrotal swelling, testicular pain and urgency.  Musculoskeletal: Negative.   Skin: Negative.   Neurological: Negative.   All other systems reviewed and are negative.   Physical Exam Updated Vital Signs BP (!) 148/71 (BP Location: Left Arm)   Pulse (!) 59   Temp 97.7 F (36.5 C) (Oral)   Resp 20   Ht 6' (1.829 m)   Wt 81.6 kg   SpO2 100%   BMI 24.41 kg/m   Physical Exam Vitals and nursing note reviewed.  Constitutional:      General: He is not in acute distress.    Appearance: He is well-developed. He is not ill-appearing, toxic-appearing or diaphoretic.  HENT:     Head: Normocephalic and atraumatic.     Mouth/Throat:     Mouth: Mucous membranes are moist.  Eyes:     Pupils: Pupils are equal, round, and reactive to light.  Cardiovascular:     Rate and Rhythm: Normal rate and regular rhythm.     Pulses: Normal pulses.          Radial pulses are 2+ on the right side and 2+ on the left side.       Dorsalis pedis pulses are 2+ on the right side and 2+ on the left side.     Heart sounds: Normal heart sounds.  Pulmonary:     Effort: Pulmonary effort is normal. No respiratory distress.     Breath sounds: Normal breath sounds.     Comments: Clear to auscultation bilaterally.  Speaks in full sentences without difficulty. Abdominal:     General: Bowel sounds are normal. There is no distension.     Palpations: Abdomen is soft.     Tenderness: There is  generalized abdominal tenderness. There is no right CVA tenderness, left CVA tenderness, guarding or rebound. Negative signs include Murphy's sign and McBurney's sign.     Hernia: No hernia is present.     Comments: Diffuse tenderness, worse to right abdomen.  No midline pulsatile abdominal mass.  Negative Murphy sign, negative McBurney point.   Musculoskeletal:        General: Normal range of motion.     Cervical back: Normal range of motion and neck supple.     Comments: No bony tenderness.  Moves all 4 extremities without difficulty.  Compartments soft.  Skin:    General: Skin is warm and dry.     Capillary Refill: Capillary refill takes less than 2 seconds.  Comments: No edema, erythema or warmth.  No fluctuance or induration.  Neurological:     General: No focal deficit present.     Mental Status: He is alert.    ED Results / Procedures / Treatments   Labs (all labs ordered are listed, but only abnormal results are displayed) Labs Reviewed  CBC WITH DIFFERENTIAL/PLATELET - Abnormal; Notable for the following components:      Result Value   Platelets 146 (*)    All other components within normal limits  COMPREHENSIVE METABOLIC PANEL - Abnormal; Notable for the following components:   Glucose, Bld 114 (*)    Total Bilirubin 1.3 (*)    All other components within normal limits  URINALYSIS, ROUTINE W REFLEX MICROSCOPIC - Abnormal; Notable for the following components:   APPearance HAZY (*)    Hgb urine dipstick MODERATE (*)    RBC / HPF >50 (*)    Bacteria, UA RARE (*)    All other components within normal limits  URINE CULTURE  LIPASE, BLOOD    EKG EKG Interpretation  Date/Time:  Sunday November 05 2020 10:21:03 EDT Ventricular Rate:  55 PR Interval:  210 QRS Duration: 93 QT Interval:  416 QTC Calculation: 398 R Axis:   67 Text Interpretation: Sinus rhythm Ventricular premature complex No acute changes No significant change since last tracing Confirmed by Varney Biles ([id number]) on 11/05/2020 10:52:22 AM   Radiology No results found.  Procedures Procedures   Medications Ordered in ED Medications - No data to display  ED Course  I have reviewed the triage vital signs and the nursing notes.  Pertinent labs & imaging results that were available during my care of the patient were reviewed by me and considered in my medical decision making (see chart for details).  1 year presents for evaluation abdominal pain.  Afebrile, nonseptic, non-ill-appearing. Began yesterday.  Does not radiate into back or groin.  Is having persistent hematuria after having TURP 10 days ago.  States urology is aware of this.  Denies any difficulty urination actually states since the procedure he has had significantly improved in his flow.  No pain with bowel movements.  Heart and lungs clear.  Abdomen with generalized tenderness.  He is neurovascularly intact.  Pain began after taking laxative for constipation.  He subsequently feels like he is "cleaned out."  He denies any melena or bright red blood per rectum.  No chest pain, shortness of breath, back or flank pain.  No midline pulsatile abdominal mass.  No history of AAA or dissection. We will plan on labs, imaging and reassess.  Labs and imaging personally reviewed and interpreted:  CBC without leukocytosis, platelets 146, similar to prior Metabolic panel glucose 209, T bili 1.3 Lipase 37 UA blood, no infection EKG no ischmia. No acute changes  Reassessed.  No pain currently.  Tolerating p.o. intake.  Question if patient's pain was due to his recent MiraLAX use for his constipation, he is now having bowel movements.  He has benign repeat abdominal exam.  He was seen and evaluated by attending, Dr. Kathrynn Humble.  We will hold on CT scan given he does not currently have any pain. He will need to follow up with Dr. Jeffie Pollock with urology given his persistent hematuria. He is urinating without difficulty here in ED. No retention.   Patient feels comfortable going home at this time.  Patient is nontoxic, nonseptic appearing, in no apparent distress.  Patient's pain and other symptoms adequately managed in emergency  department.  Fluid bolus given.  Labs, imaging and vitals reviewed.  Patient does not meet the SIRS or Sepsis criteria.  On repeat exam patient does not have Levine surgical abdomin and there are no peritoneal signs.  No indication of appendicitis, bowel obstruction, bowel perforation, cholecystitis, diverticulitis, AAA, dissection.    The patient has been appropriately medically screened and/or stabilized in the ED. I have low suspicion for any other emergent medical condition which would require further screening, evaluation or treatment in the ED or require inpatient management.  Patient is hemodynamically stable and in no acute distress.  Patient able to ambulate in department prior to ED.  Evaluation does not show acute pathology that would require ongoing or additional emergent interventions while in the emergency department or further inpatient treatment.  I have discussed the diagnosis with the patient and answered all questions.  Pain is been managed while in the emergency department and patient has no further complaints prior to discharge.  Patient is comfortable with plan discussed in room and is stable for discharge at this time.  I have discussed strict return precautions for returning to the emergency department.  Patient was encouraged to follow-up with PCP/specialist refer to at discharge.    MDM Rules/Calculators/Levine&P                           Final Clinical Impression(s) / ED Diagnoses Final diagnoses:  Generalized abdominal pain    Rx / DC Orders ED Discharge Orders    None       Brian Weigelt A, PA-C 11/05/20 1804    Varney Biles, MD 11/07/20 2032

## 2020-11-05 NOTE — Discharge Instructions (Signed)
Your lab work did not show any significant findings here in the emergency department  Return for any worsening symptoms  Call urology tomorrow to make an appointment this week to be reassessed

## 2020-11-05 NOTE — ED Triage Notes (Signed)
Pt arrived via walk in, c/o diffuse abd pain that started last ngiht. Denies any n/v or diarrhea.

## 2020-11-05 NOTE — ED Notes (Signed)
Pt ambulatory to bathroom to provide urine sample

## 2020-11-06 DIAGNOSIS — R310 Gross hematuria: Secondary | ICD-10-CM | POA: Diagnosis not present

## 2020-11-06 LAB — URINE CULTURE: Culture: <10000 — AB

## 2020-11-08 ENCOUNTER — Ambulatory Visit (HOSPITAL_COMMUNITY): Payer: Medicare HMO | Admitting: Anesthesiology

## 2020-11-08 ENCOUNTER — Encounter (HOSPITAL_COMMUNITY): Payer: Self-pay | Admitting: Urology

## 2020-11-08 ENCOUNTER — Other Ambulatory Visit: Payer: Self-pay | Admitting: Urology

## 2020-11-08 ENCOUNTER — Encounter (HOSPITAL_COMMUNITY)
Admission: RE | Disposition: A | Discharge status: Home / Self Care | Payer: Self-pay | Source: Other Acute Inpatient Hospital | Attending: Urology

## 2020-11-08 ENCOUNTER — Ambulatory Visit (HOSPITAL_COMMUNITY)
Admission: RE | Admit: 2020-11-08 | Discharge: 2020-11-08 | Disposition: A | Discharge status: Home / Self Care | Payer: Medicare HMO | Source: Other Acute Inpatient Hospital | Attending: Urology | Admitting: Urology

## 2020-11-08 DIAGNOSIS — Z7982 Long term (current) use of aspirin: Secondary | ICD-10-CM | POA: Insufficient documentation

## 2020-11-08 DIAGNOSIS — N3289 Other specified disorders of bladder: Secondary | ICD-10-CM | POA: Diagnosis not present

## 2020-11-08 DIAGNOSIS — Z8249 Family history of ischemic heart disease and other diseases of the circulatory system: Secondary | ICD-10-CM | POA: Insufficient documentation

## 2020-11-08 DIAGNOSIS — Z87891 Personal history of nicotine dependence: Secondary | ICD-10-CM | POA: Insufficient documentation

## 2020-11-08 DIAGNOSIS — N99820 Postprocedural hemorrhage and hematoma of a genitourinary system organ or structure following a genitourinary system procedure: Secondary | ICD-10-CM | POA: Insufficient documentation

## 2020-11-08 DIAGNOSIS — R310 Gross hematuria: Secondary | ICD-10-CM | POA: Insufficient documentation

## 2020-11-08 DIAGNOSIS — N138 Other obstructive and reflux uropathy: Secondary | ICD-10-CM | POA: Diagnosis not present

## 2020-11-08 DIAGNOSIS — Z79899 Other long term (current) drug therapy: Secondary | ICD-10-CM | POA: Insufficient documentation

## 2020-11-08 DIAGNOSIS — Z8051 Family history of malignant neoplasm of kidney: Secondary | ICD-10-CM | POA: Insufficient documentation

## 2020-11-08 DIAGNOSIS — N401 Enlarged prostate with lower urinary tract symptoms: Secondary | ICD-10-CM | POA: Diagnosis not present

## 2020-11-08 DIAGNOSIS — Z20822 Contact with and (suspected) exposure to covid-19: Secondary | ICD-10-CM | POA: Insufficient documentation

## 2020-11-08 DIAGNOSIS — D696 Thrombocytopenia, unspecified: Secondary | ICD-10-CM | POA: Diagnosis not present

## 2020-11-08 HISTORY — PX: CYSTOSCOPY WITH FULGERATION: SHX6638

## 2020-11-08 LAB — CBC
HCT: 33.9 % — ABNORMAL LOW (ref 39.0–52.0)
Hemoglobin: 11.7 g/dL — ABNORMAL LOW (ref 13.0–17.0)
MCH: 31.1 pg (ref 26.0–34.0)
MCHC: 34.5 g/dL (ref 30.0–36.0)
MCV: 90.2 fL (ref 80.0–100.0)
Platelets: 136 K/uL — ABNORMAL LOW (ref 150–400)
RBC: 3.76 MIL/uL — ABNORMAL LOW (ref 4.22–5.81)
RDW: 12.7 % (ref 11.5–15.5)
WBC: 3.6 K/uL — ABNORMAL LOW (ref 4.0–10.5)
nRBC: 0 % (ref 0.0–0.2)

## 2020-11-08 LAB — SARS CORONAVIRUS 2 BY RT PCR (HOSPITAL ORDER, PERFORMED IN ~~LOC~~ HOSPITAL LAB): SARS Coronavirus 2: NEGATIVE

## 2020-11-08 SURGERY — CYSTOSCOPY, WITH BLADDER FULGURATION
Anesthesia: General | Site: Bladder

## 2020-11-08 MED ORDER — MORPHINE SULFATE (PF) 4 MG/ML IV SOLN: 2.0000 mg | INTRAVENOUS | Status: DC | PRN

## 2020-11-08 MED ORDER — SUCCINYLCHOLINE CHLORIDE 200 MG/10ML IV SOSY
PREFILLED_SYRINGE | INTRAVENOUS | Status: AC
  Filled 2020-11-08: qty 10

## 2020-11-08 MED ORDER — PROPOFOL 10 MG/ML IV BOLUS
INTRAVENOUS | Status: AC
  Filled 2020-11-08: qty 20

## 2020-11-08 MED ORDER — SODIUM CHLORIDE 0.9% FLUSH: 3.0000 mL | INTRAVENOUS | Status: DC | PRN

## 2020-11-08 MED ORDER — PROPOFOL 10 MG/ML IV BOLUS
INTRAVENOUS | Status: DC | PRN
  Administered 2020-11-08: 160 mg via INTRAVENOUS

## 2020-11-08 MED ORDER — SODIUM CHLORIDE 0.9 % IV SOLN: 250.0000 mL | INTRAVENOUS | Status: DC | PRN

## 2020-11-08 MED ORDER — OXYCODONE HCL 5 MG PO TABS
ORAL_TABLET | ORAL | Status: AC
  Administered 2020-11-08: 5 mg via ORAL
  Filled 2020-11-08: qty 1

## 2020-11-08 MED ORDER — LACTATED RINGERS IV SOLN: INTRAVENOUS | Status: DC | PRN

## 2020-11-08 MED ORDER — LIDOCAINE 2% (20 MG/ML) 5 ML SYRINGE
INTRAMUSCULAR | Status: DC | PRN
  Administered 2020-11-08: 80 mg via INTRAVENOUS

## 2020-11-08 MED ORDER — ONDANSETRON HCL 4 MG/2ML IJ SOLN: 4.0000 mg | Freq: Once | INTRAMUSCULAR | Status: DC | PRN

## 2020-11-08 MED ORDER — ACETAMINOPHEN 650 MG RE SUPP
650.0000 mg | RECTAL | Status: DC | PRN
  Filled 2020-11-08: qty 1

## 2020-11-08 MED ORDER — FENTANYL CITRATE (PF) 100 MCG/2ML IJ SOLN
INTRAMUSCULAR | Status: AC
  Administered 2020-11-08: 50 ug via INTRAVENOUS
  Filled 2020-11-08: qty 2

## 2020-11-08 MED ORDER — SODIUM CHLORIDE 0.9% FLUSH: 3.0000 mL | Freq: Two times a day (BID) | INTRAVENOUS | Status: DC

## 2020-11-08 MED ORDER — ACETAMINOPHEN 10 MG/ML IV SOLN
INTRAVENOUS | Status: AC
  Filled 2020-11-08: qty 100

## 2020-11-08 MED ORDER — ONDANSETRON HCL 4 MG/2ML IJ SOLN
INTRAMUSCULAR | Status: AC
  Filled 2020-11-08: qty 2

## 2020-11-08 MED ORDER — FENTANYL CITRATE (PF) 100 MCG/2ML IJ SOLN
INTRAMUSCULAR | Status: DC | PRN
  Administered 2020-11-08 (×2): 50 ug via INTRAVENOUS

## 2020-11-08 MED ORDER — FENTANYL CITRATE (PF) 100 MCG/2ML IJ SOLN
INTRAMUSCULAR | Status: AC
  Filled 2020-11-08: qty 2

## 2020-11-08 MED ORDER — EPHEDRINE SULFATE-NACL 50-0.9 MG/10ML-% IV SOSY
PREFILLED_SYRINGE | INTRAVENOUS | Status: DC | PRN
  Administered 2020-11-08: 10 mg via INTRAVENOUS
  Administered 2020-11-08: 5 mg via INTRAVENOUS

## 2020-11-08 MED ORDER — EPHEDRINE 5 MG/ML INJ
INTRAVENOUS | Status: AC
  Filled 2020-11-08: qty 10

## 2020-11-08 MED ORDER — STERILE WATER FOR IRRIGATION IR SOLN
Status: DC | PRN
  Administered 2020-11-08: 3000 mL

## 2020-11-08 MED ORDER — SUCCINYLCHOLINE CHLORIDE 200 MG/10ML IV SOSY
PREFILLED_SYRINGE | INTRAVENOUS | Status: DC | PRN
  Administered 2020-11-08: 100 mg via INTRAVENOUS

## 2020-11-08 MED ORDER — OXYCODONE HCL 5 MG PO TABS
5.0000 mg | ORAL_TABLET | ORAL | Status: DC | PRN
Start: 2020-11-08 — End: 2020-11-09

## 2020-11-08 MED ORDER — 0.9 % SODIUM CHLORIDE (POUR BTL) OPTIME
TOPICAL | Status: DC | PRN
  Administered 2020-11-08: 1000 mL

## 2020-11-08 MED ORDER — FENTANYL CITRATE (PF) 100 MCG/2ML IJ SOLN
25.0000 ug | INTRAMUSCULAR | Status: DC | PRN
  Administered 2020-11-08: 50 ug via INTRAVENOUS

## 2020-11-08 MED ORDER — ACETAMINOPHEN 325 MG PO TABS: 650.0000 mg | ORAL_TABLET | ORAL | Status: DC | PRN

## 2020-11-08 MED ORDER — DEXAMETHASONE SODIUM PHOSPHATE 10 MG/ML IJ SOLN
INTRAMUSCULAR | Status: AC
  Filled 2020-11-08: qty 1

## 2020-11-08 MED ORDER — CEFAZOLIN SODIUM-DEXTROSE 2-4 GM/100ML-% IV SOLN
2.0000 g | INTRAVENOUS | Status: AC
  Administered 2020-11-08: 2 g via INTRAVENOUS
  Filled 2020-11-08: qty 100

## 2020-11-08 MED ORDER — LIDOCAINE HCL URETHRAL/MUCOSAL 2 % EX GEL
CUTANEOUS | Status: AC
  Filled 2020-11-08: qty 30

## 2020-11-08 MED ORDER — ACETAMINOPHEN 10 MG/ML IV SOLN
INTRAVENOUS | Status: DC | PRN
  Administered 2020-11-08: 1000 mg via INTRAVENOUS

## 2020-11-08 MED ORDER — LACTATED RINGERS IV SOLN: INTRAVENOUS | Status: DC

## 2020-11-08 MED ORDER — DEXAMETHASONE SODIUM PHOSPHATE 10 MG/ML IJ SOLN
INTRAMUSCULAR | Status: DC | PRN
  Administered 2020-11-08: 8 mg via INTRAVENOUS

## 2020-11-08 MED ORDER — ONDANSETRON HCL 4 MG/2ML IJ SOLN
INTRAMUSCULAR | Status: DC | PRN
  Administered 2020-11-08: 4 mg via INTRAVENOUS

## 2020-11-08 MED ORDER — SODIUM CHLORIDE 0.9 % IR SOLN
Status: DC | PRN
  Administered 2020-11-08 (×2): 3000 mL

## 2020-11-08 SURGICAL SUPPLY — 20 items
BAG DRN RND TRDRP ANRFLXCHMBR (UROLOGICAL SUPPLIES)
BAG URINE DRAIN 2000ML AR STRL (UROLOGICAL SUPPLIES) IMPLANT
BAG URO CATCHER STRL LF (MISCELLANEOUS) ×3 IMPLANT
CATH FOLEY 3WAY 30CC 22FR (CATHETERS) IMPLANT
CATH URET 5FR 28IN OPEN ENDED (CATHETERS) IMPLANT
DRAPE FOOT SWITCH (DRAPES) ×3 IMPLANT
GLOVE SURG POLYISO LF SZ8 (GLOVE) IMPLANT
GOWN STRL REUS W/TWL XL LVL3 (GOWN DISPOSABLE) ×3 IMPLANT
HOLDER FOLEY CATH W/STRAP (MISCELLANEOUS) IMPLANT
KIT TURNOVER KIT A (KITS) ×3 IMPLANT
LOOP CUT BIPOLAR 24F LRG (ELECTROSURGICAL) ×3 IMPLANT
MANIFOLD NEPTUNE II (INSTRUMENTS) ×3 IMPLANT
PACK CYSTO (CUSTOM PROCEDURE TRAY) ×3 IMPLANT
SUT ETHILON 3 0 PS 1 (SUTURE) IMPLANT
SYR 30ML LL (SYRINGE) IMPLANT
SYR TOOMEY IRRIG 70ML (MISCELLANEOUS)
SYRINGE TOOMEY IRRIG 70ML (MISCELLANEOUS) IMPLANT
TUBING CONNECTING 10 (TUBING) ×2 IMPLANT
TUBING CONNECTING 10' (TUBING) ×1
TUBING UROLOGY SET (TUBING) ×3 IMPLANT

## 2020-11-08 NOTE — Anesthesia Preprocedure Evaluation (Addendum)
Anesthesia Evaluation  Patient identified by MRN, date of birth, ID band Patient awake    Reviewed: Allergy & Precautions, NPO status , Patient's Chart, lab work & pertinent test results  Airway Mallampati: II  TM Distance: >3 FB Neck ROM: Full    Dental  (+) Teeth Intact, Dental Advisory Given   Pulmonary former smoker,    Pulmonary exam normal breath sounds clear to auscultation       Cardiovascular negative cardio ROS Normal cardiovascular exam Rhythm:Regular Rate:Normal     Neuro/Psych negative neurological ROS  negative psych ROS   GI/Hepatic negative GI ROS, Neg liver ROS,   Endo/Other  negative endocrine ROS  Renal/GU negative Renal ROS   Prostatic bleeding s/p TURP 10/24/20     Musculoskeletal negative musculoskeletal ROS (+)   Abdominal   Peds  Hematology  (+) Blood dyscrasia (Thrombocytopenia), ,   Anesthesia Other Findings Day of surgery medications reviewed with the patient.  Reproductive/Obstetrics                             Anesthesia Physical Anesthesia Plan  ASA: II  Anesthesia Plan: General   Post-op Pain Management:    Induction: Intravenous  PONV Risk Score and Plan: 3 and Dexamethasone and Ondansetron  Airway Management Planned: Oral ETT  Additional Equipment:   Intra-op Plan:   Post-operative Plan: Extubation in OR  Informed Consent: I have reviewed the patients History and Physical, chart, labs and discussed the procedure including the risks, benefits and alternatives for the proposed anesthesia with the patient or authorized representative who has indicated his/her understanding and acceptance.     Dental advisory given  Plan Discussed with: CRNA  Anesthesia Plan Comments:        Anesthesia Quick Evaluation

## 2020-11-08 NOTE — Anesthesia Procedure Notes (Signed)
Procedure Name: Intubation Date/Time: 11/08/2020 6:18 PM Performed by: Montel Clock, CRNA Pre-anesthesia Checklist: Patient identified, Emergency Drugs available, Suction available, Patient being monitored and Timeout performed Patient Re-evaluated:Patient Re-evaluated prior to induction Oxygen Delivery Method: Circle system utilized Preoxygenation: Pre-oxygenation with 100% oxygen Induction Type: IV induction, Rapid sequence and Cricoid Pressure applied Laryngoscope Size: Mac and 3 Grade View: Grade I Tube type: Oral Tube size: 7.5 mm Number of attempts: 1 Airway Equipment and Method: Stylet Placement Confirmation: ETT inserted through vocal cords under direct vision,  positive ETCO2 and breath sounds checked- equal and bilateral Secured at: 23 cm Tube secured with: Tape Dental Injury: Teeth and Oropharynx as per pre-operative assessment

## 2020-11-08 NOTE — Anesthesia Postprocedure Evaluation (Signed)
Anesthesia Post Note  Patient: Brian Levine  Procedure(s) Performed: CYSTOSCOPY WITH FULGERATION OF BLEEDERS (N/A Bladder)     Patient location during evaluation: PACU Anesthesia Type: General Level of consciousness: awake and alert Pain management: pain level controlled Vital Signs Assessment: post-procedure vital signs reviewed and stable Respiratory status: spontaneous breathing, nonlabored ventilation, respiratory function stable and patient connected to nasal cannula oxygen Cardiovascular status: blood pressure returned to baseline and stable Postop Assessment: no apparent nausea or vomiting Anesthetic complications: no   No complications documented.  Last Vitals:  Vitals:   11/08/20 1930 11/08/20 1945  BP: (!) 142/64 116/89  Pulse: 72 66  Resp: 19 17  Temp:  36.6 C  SpO2: 100% 100%    Last Pain:  Vitals:   11/08/20 1945  TempSrc:   PainSc: 1                  Catalina Gravel

## 2020-11-08 NOTE — Discharge Instructions (Addendum)
CYSTOSCOPY HOME CARE INSTRUCTIONS  Activity: Rest for the remainder of the day.  Do not drive or operate equipment today.  You may resume normal activities in one to two days as instructed by your physician.   Meals: Drink plenty of liquids and eat light foods such as gelatin or soup this evening.  You may return to a normal meal plan tomorrow.  Return to Work: You may return to work in one to two days or as instructed by your physician.  Special Instructions / Symptoms: Call your physician if any of these symptoms occur:   -persistent or heavy bleeding  -bleeding which continues after first few urination  -large blood clots that are difficult to pass  -urine stream diminishes or stops completely  -fever equal to or higher than 101 degrees Farenheit.  -cloudy urine with a strong, foul odor  -severe pain  Females should always wipe from front to back after elimination.  You may feel some burning pain when you urinate.  This should disappear with time.  Applying moist heat to the lower abdomen or a hot tub bath may help relieve the pain. \   Patient Signature:  ________________________________________________________  Nurse's Signature:  ________________________________________________________   General Anesthesia, Adult, Care After This sheet gives you information about how to care for yourself after your procedure. Your health care provider may also give you more specific instructions. If you have problems or questions, contact your health care provider. What can I expect after the procedure? After the procedure, the following side effects are common:  Pain or discomfort at the IV site.  Nausea.  Vomiting.  Sore throat.  Trouble concentrating.  Feeling cold or chills.  Feeling weak or tired.  Sleepiness and fatigue.  Soreness and body aches. These side effects can affect parts of the body that were not involved in surgery. Follow these instructions at home: For  the time period you were told by your health care provider:  Rest.  Do not participate in activities where you could fall or become injured.  Do not drive or use machinery.  Do not drink alcohol.  Do not take sleeping pills or medicines that cause drowsiness.  Do not make important decisions or sign legal documents.  Do not take care of children on your own.   Eating and drinking  Follow any instructions from your health care provider about eating or drinking restrictions.  When you feel hungry, start by eating small amounts of foods that are soft and easy to digest (bland), such as toast. Gradually return to your regular diet.  Drink enough fluid to keep your urine pale yellow.  If you vomit, rehydrate by drinking water, juice, or clear broth. General instructions  If you have sleep apnea, surgery and certain medicines can increase your risk for breathing problems. Follow instructions from your health care provider about wearing your sleep device: ? Anytime you are sleeping, including during daytime naps. ? While taking prescription pain medicines, sleeping medicines, or medicines that make you drowsy.  Have a responsible adult stay with you for the time you are told. It is important to have someone help care for you until you are awake and alert.  Return to your normal activities as told by your health care provider. Ask your health care provider what activities are safe for you.  Take over-the-counter and prescription medicines only as told by your health care provider.  If you smoke, do not smoke without supervision.  Keep all follow-up visits  as told by your health care provider. This is important. Contact a health care provider if:  You have nausea or vomiting that does not get better with medicine.  You cannot eat or drink without vomiting.  You have pain that does not get better with medicine.  You are unable to pass urine.  You develop a skin rash.  You  have a fever.  You have redness around your IV site that gets worse. Get help right away if:  You have difficulty breathing.  You have chest pain.  You have blood in your urine or stool, or you vomit blood. Summary  After the procedure, it is common to have a sore throat or nausea. It is also common to feel tired.  Have a responsible adult stay with you for the time you are told. It is important to have someone help care for you until you are awake and alert.  When you feel hungry, start by eating small amounts of foods that are soft and easy to digest (bland), such as toast. Gradually return to your regular diet.  Drink enough fluid to keep your urine pale yellow.  Return to your normal activities as told by your health care provider. Ask your health care provider what activities are safe for you. This information is not intended to replace advice given to you by your health care provider. Make sure you discuss any questions you have with your health care provider. Document Revised: 03/30/2020 Document Reviewed: 10/28/2019 Elsevier Patient Education  2021 [employer].

## 2020-11-08 NOTE — Op Note (Signed)
Procedure: Cystoscopy with clot evacuation and fulguration of prostatic bleeders.  Preop diagnosis: Gross hematuria status post TURP.  Postop diagnosis: Same.  Surgeon: Dr. Irine Seal.  Anesthesia: General.  Specimen: None.  Drain: None.  EBL: None.  Complications: None.  Indications: Brian Levine is a 75 year old male who had a TURP on 10/23/2020 and has had some intermittent bleeding with clots since.  Cystoscopy today revealed a source of bleeding in the anterior apical prostate with some clot in the prostatic urethra and it was felt that cystoscopy under anesthesia with fulguration was indicated.  Procedure: He was given 2 g of Ancef.  A general anesthetic was induced.  He was placed in lithotomy position and fitted with PAS hose.  His perineum and genitalia were prepped with Betadine solution he was draped in usual sterile fashion.  Cystoscopy was performed using a 23 [place] scope and 30 degree lens.  Examination revealed a normal urethra.  The external sphincter was intact.  The prostatic urethra had evidence of recent TUR defect that was patent but there was some old and new clot within the prostatic urethra and some bloody urine in the bladder without clots.  Initially a clear source of bleeding was not identified and attempted to fulgurate bleeding with the [place] were not successful.  I change the irrigant from water to saline and placed a 26 French continuous flow resectoscope sheath which was fitted with an Brian Levine handle with a bipolar loop and the 30 degree lens.  With better flow and visualization I was able to better inspect the prostatic urethra.  There were several areas with a little bit of adherent clot which were scraped away with the finding of some mild oozing beneath the clots.  These areas were generously fulgurated there was a BPH nodule which had expanded into the lumen on the right and to aid visualization and flow I did resect to chips from this area and then generously  fulgurated the base the resection.  I finally found a brisk arterial bleeder in the right anterior apical prostate.  It was small but is the most likely culprit for his recurrent bleeding.  I generally fulgurated this area with control of the bleeding.  Additional fulguration in the bladder neck region circumferentially in the apex of the prostate particularly anteriorly were performed for further minor oozing.  The bladder was then evacuated free of the old clot and debris and final inspection revealed no retained tissue and no active bleeding within the prostatic urethra.  The scope was removed and pressure on the bladder produced a good stream.  It was not felt that a Foley catheter was indicated.  He was taken down from lithotomy position, his anesthetic was reversed and he was moved recovery in stable condition.  There were no complications.

## 2020-11-08 NOTE — Transfer of Care (Signed)
Immediate Anesthesia Transfer of Care Note  Patient: Brian Levine  Procedure(s) Performed: CYSTOSCOPY WITH FULGERATION OF BLEEDERS (N/A Bladder)  Patient Location: PACU  Anesthesia Type:General  Level of Consciousness: drowsy and patient cooperative  Airway & Oxygen Therapy: Patient Spontanous Breathing and Patient connected to face mask oxygen  Post-op Assessment: Report given to RN and Post -op Vital signs reviewed and stable  Post vital signs: Reviewed and stable  Last Vitals:  Vitals Value Taken Time  BP 139/65 11/08/20 1906  Temp    Pulse 72 11/08/20 1907  Resp 15 11/08/20 1907  SpO2 100 % 11/08/20 1907  Vitals shown include unvalidated device data.  Last Pain:  Vitals:   11/08/20 1500  TempSrc:   PainSc: 0-No pain      Patients Stated Pain Goal: 5 (13/64/38 3779)  Complications: No complications documented.

## 2020-11-08 NOTE — Interval H&P Note (Signed)
History and Physical Interval Note:  He has had persistent hematuria and office cystoscopy today demonstrated and active bleeding vessel in the apical anterior prostate and I am going to fulgurate.   11/08/2020 5:58 PM  [name]  has presented today for surgery, with the diagnosis of Prostatic Bleeding.  The various methods of treatment have been discussed with the patient and family. After consideration of risks, benefits and other options for treatment, the patient has consented to  Procedure(s): CYSTOSCOPY WITH FULGERATION OF BLEEDERS (N/A) as a surgical intervention.  The patient's history has been reviewed, patient examined, no change in status, stable for surgery.  I have reviewed the patient's chart and labs.  Questions were answered to the patient's satisfaction.     Irine Seal

## 2020-11-09 ENCOUNTER — Encounter (HOSPITAL_COMMUNITY): Payer: Self-pay | Admitting: Urology

## 2020-11-17 DIAGNOSIS — R310 Gross hematuria: Secondary | ICD-10-CM | POA: Diagnosis not present

## 2020-11-21 DIAGNOSIS — R310 Gross hematuria: Secondary | ICD-10-CM | POA: Diagnosis not present

## 2020-11-21 DIAGNOSIS — R3912 Poor urinary stream: Secondary | ICD-10-CM | POA: Diagnosis not present

## 2020-11-27 DIAGNOSIS — R3914 Feeling of incomplete bladder emptying: Secondary | ICD-10-CM | POA: Diagnosis not present

## 2020-11-27 DIAGNOSIS — R310 Gross hematuria: Secondary | ICD-10-CM | POA: Diagnosis not present

## 2020-11-27 DIAGNOSIS — N401 Enlarged prostate with lower urinary tract symptoms: Secondary | ICD-10-CM | POA: Diagnosis not present

## 2020-11-29 ENCOUNTER — Other Ambulatory Visit: Payer: Self-pay | Admitting: Urology

## 2020-11-29 ENCOUNTER — Other Ambulatory Visit: Payer: Self-pay

## 2020-11-29 ENCOUNTER — Encounter (HOSPITAL_COMMUNITY): Payer: Self-pay | Admitting: Urology

## 2020-11-29 NOTE — Progress Notes (Signed)
COVID Vaccine Completed: Yes Date COVID Vaccine completed: x2 Has received booster: Yes COVID vaccine manufacturer: [place]     Date of COVID positive in last 90 days: No  PCP - Lavone Orn, MD Cardiologist - N/A  Chest x-ray - greater than 1 year EKG - 11/05/20 in epic Stress Test - greater than 2years ECHO - N/A Cardiac Cath - N/A Pacemaker/ICD device last checked: N/A  Sleep Study - N/A CPAP - N/A  Fasting Blood Sugar - N/A Checks Blood Sugar __N/A___ times a day  Blood Thinner Instructions: N/A Aspirin Instructions: N/A Last Dose: N/A  Activity level:  Able to exercise without symptoms     Anesthesia review: N/A  Patient denies shortness of breath, fever, cough and chest pain at PAT appointment   Patient verbalized understanding of instructions that were given to them at the PAT appointment. Patient was also instructed that they will need to review over the PAT instructions again at home before surgery.

## 2020-11-30 ENCOUNTER — Other Ambulatory Visit (HOSPITAL_COMMUNITY)
Admission: RE | Admit: 2020-11-30 | Discharge: 2020-11-30 | Disposition: A | Discharge status: Home / Self Care | Payer: Medicare HMO | Source: Ambulatory Visit | Attending: Urology | Admitting: Urology

## 2020-11-30 DIAGNOSIS — Z01812 Encounter for preprocedural laboratory examination: Secondary | ICD-10-CM | POA: Diagnosis not present

## 2020-11-30 DIAGNOSIS — Z20822 Contact with and (suspected) exposure to covid-19: Secondary | ICD-10-CM | POA: Insufficient documentation

## 2020-12-01 ENCOUNTER — Encounter (HOSPITAL_COMMUNITY)
Admission: RE | Disposition: A | Discharge status: Home / Self Care | Payer: Self-pay | Source: Home / Self Care | Attending: Urology

## 2020-12-01 ENCOUNTER — Ambulatory Visit (HOSPITAL_COMMUNITY)
Admission: RE | Admit: 2020-12-01 | Discharge: 2020-12-01 | Disposition: A | Discharge status: Home / Self Care | Payer: Medicare HMO | Attending: Urology | Admitting: Urology

## 2020-12-01 ENCOUNTER — Encounter (HOSPITAL_COMMUNITY): Payer: Self-pay | Admitting: Urology

## 2020-12-01 ENCOUNTER — Ambulatory Visit (HOSPITAL_COMMUNITY): Payer: Medicare HMO | Admitting: Anesthesiology

## 2020-12-01 DIAGNOSIS — Z79899 Other long term (current) drug therapy: Secondary | ICD-10-CM | POA: Diagnosis not present

## 2020-12-01 DIAGNOSIS — Z87891 Personal history of nicotine dependence: Secondary | ICD-10-CM | POA: Diagnosis not present

## 2020-12-01 DIAGNOSIS — N029 Recurrent and persistent hematuria with unspecified morphologic changes: Secondary | ICD-10-CM | POA: Insufficient documentation

## 2020-12-01 DIAGNOSIS — I10 Essential (primary) hypertension: Secondary | ICD-10-CM | POA: Diagnosis not present

## 2020-12-01 DIAGNOSIS — D72819 Decreased white blood cell count, unspecified: Secondary | ICD-10-CM | POA: Diagnosis not present

## 2020-12-01 DIAGNOSIS — R310 Gross hematuria: Secondary | ICD-10-CM | POA: Diagnosis not present

## 2020-12-01 DIAGNOSIS — N138 Other obstructive and reflux uropathy: Secondary | ICD-10-CM | POA: Diagnosis not present

## 2020-12-01 DIAGNOSIS — N401 Enlarged prostate with lower urinary tract symptoms: Secondary | ICD-10-CM | POA: Diagnosis not present

## 2020-12-01 DIAGNOSIS — Z7982 Long term (current) use of aspirin: Secondary | ICD-10-CM | POA: Insufficient documentation

## 2020-12-01 DIAGNOSIS — N9989 Other postprocedural complications and disorders of genitourinary system: Secondary | ICD-10-CM | POA: Diagnosis not present

## 2020-12-01 HISTORY — PX: CYSTOSCOPY WITH FULGERATION: SHX6638

## 2020-12-01 LAB — BASIC METABOLIC PANEL
Anion gap: 8 (ref 5–15)
BUN: 12 mg/dL (ref 8–23)
CO2: 24 mmol/L (ref 22–32)
Calcium: 9.4 mg/dL (ref 8.9–10.3)
Chloride: 106 mmol/L (ref 98–111)
Creatinine, Ser: 1 mg/dL (ref 0.61–1.24)
GFR, Estimated: >60 mL/min (ref >60)
Glucose, Bld: 110 mg/dL — ABNORMAL HIGH (ref 70–99)
Potassium: 3.9 mmol/L (ref 3.5–5.1)
Sodium: 138 mmol/L (ref 135–145)

## 2020-12-01 LAB — CBC WITH DIFFERENTIAL/PLATELET
Abs Immature Granulocytes: 0.01 10*3/uL (ref 0.00–0.07)
Basophils Absolute: 0 10*3/uL (ref 0.0–0.1)
Basophils Relative: 0 %
Eosinophils Absolute: 0.1 10*3/uL (ref 0.0–0.5)
Eosinophils Relative: 3 %
HCT: 35 % — ABNORMAL LOW (ref 39.0–52.0)
Hemoglobin: 12 g/dL — ABNORMAL LOW (ref 13.0–17.0)
Immature Granulocytes: 0 %
Lymphocytes Relative: 24 %
Lymphs Abs: 0.8 10*3/uL (ref 0.7–4.0)
MCH: 30.8 pg (ref 26.0–34.0)
MCHC: 34.3 g/dL (ref 30.0–36.0)
MCV: 90 fL (ref 80.0–100.0)
Monocytes Absolute: 0.2 10*3/uL (ref 0.1–1.0)
Monocytes Relative: 5 %
Neutro Abs: 2.3 10*3/uL (ref 1.7–7.7)
Neutrophils Relative %: 68 %
Platelets: 121 10*3/uL — ABNORMAL LOW (ref 150–400)
RBC: 3.89 MIL/uL — ABNORMAL LOW (ref 4.22–5.81)
RDW: 12.7 % (ref 11.5–15.5)
WBC: 3.4 10*3/uL — ABNORMAL LOW (ref 4.0–10.5)
nRBC: 0 % (ref 0.0–0.2)

## 2020-12-01 LAB — PROTIME-INR
INR: 1.1 (ref 0.8–1.2)
Prothrombin Time: 13.9 seconds (ref 11.4–15.2)

## 2020-12-01 LAB — SARS CORONAVIRUS 2 (TAT 6-24 HRS): SARS Coronavirus 2: NEGATIVE

## 2020-12-01 SURGERY — CYSTOSCOPY, WITH BLADDER FULGURATION
Anesthesia: General

## 2020-12-01 MED ORDER — MEPERIDINE HCL 50 MG/ML IJ SOLN: 6.2500 mg | INTRAMUSCULAR | Status: DC | PRN

## 2020-12-01 MED ORDER — OXYCODONE HCL 5 MG PO TABS
ORAL_TABLET | ORAL | Status: AC
  Filled 2020-12-01: qty 1

## 2020-12-01 MED ORDER — HYDROMORPHONE HCL 1 MG/ML IJ SOLN
INTRAMUSCULAR | Status: AC
  Filled 2020-12-01: qty 1

## 2020-12-01 MED ORDER — LIDOCAINE 2% (20 MG/ML) 5 ML SYRINGE
INTRAMUSCULAR | Status: AC
  Filled 2020-12-01: qty 5

## 2020-12-01 MED ORDER — 0.9 % SODIUM CHLORIDE (POUR BTL) OPTIME
TOPICAL | Status: DC | PRN
  Administered 2020-12-01: 1000 mL

## 2020-12-01 MED ORDER — ONDANSETRON HCL 4 MG/2ML IJ SOLN
INTRAMUSCULAR | Status: DC | PRN
  Administered 2020-12-01: 4 mg via INTRAVENOUS

## 2020-12-01 MED ORDER — SODIUM CHLORIDE 0.9% FLUSH: 3.0000 mL | Freq: Two times a day (BID) | INTRAVENOUS | Status: DC

## 2020-12-01 MED ORDER — SODIUM CHLORIDE 0.9 % IR SOLN
Status: DC | PRN
  Administered 2020-12-01: 15000 mL

## 2020-12-01 MED ORDER — ONDANSETRON HCL 4 MG/2ML IJ SOLN
INTRAMUSCULAR | Status: AC
  Filled 2020-12-01: qty 2

## 2020-12-01 MED ORDER — PHENYLEPHRINE HCL-NACL 10-0.9 MG/250ML-% IV SOLN
INTRAVENOUS | Status: DC | PRN
  Administered 2020-12-01: 30 ug/min via INTRAVENOUS

## 2020-12-01 MED ORDER — DEXAMETHASONE SODIUM PHOSPHATE 10 MG/ML IJ SOLN
INTRAMUSCULAR | Status: AC
  Filled 2020-12-01: qty 1

## 2020-12-01 MED ORDER — GLYCOPYRROLATE PF 0.2 MG/ML IJ SOSY
PREFILLED_SYRINGE | INTRAMUSCULAR | Status: DC | PRN
  Administered 2020-12-01: .2 mg via INTRAVENOUS

## 2020-12-01 MED ORDER — CEFAZOLIN SODIUM-DEXTROSE 2-4 GM/100ML-% IV SOLN
2.0000 g | INTRAVENOUS | Status: AC
  Administered 2020-12-01: 2 g via INTRAVENOUS
  Filled 2020-12-01: qty 100

## 2020-12-01 MED ORDER — ORAL CARE MOUTH RINSE: 15.0000 mL | Freq: Once | OROMUCOSAL | Status: AC

## 2020-12-01 MED ORDER — DEXAMETHASONE SODIUM PHOSPHATE 10 MG/ML IJ SOLN
INTRAMUSCULAR | Status: DC | PRN
  Administered 2020-12-01: 5 mg via INTRAVENOUS

## 2020-12-01 MED ORDER — HYDROMORPHONE HCL 1 MG/ML IJ SOLN
0.2500 mg | INTRAMUSCULAR | Status: DC | PRN
  Administered 2020-12-01 (×4): 0.5 mg via INTRAVENOUS

## 2020-12-01 MED ORDER — PROPOFOL 10 MG/ML IV BOLUS
INTRAVENOUS | Status: DC | PRN
  Administered 2020-12-01 (×3): 100 mg via INTRAVENOUS

## 2020-12-01 MED ORDER — MIDAZOLAM HCL 2 MG/2ML IJ SOLN: 0.5000 mg | Freq: Once | INTRAMUSCULAR | Status: DC | PRN

## 2020-12-01 MED ORDER — FENTANYL CITRATE (PF) 100 MCG/2ML IJ SOLN
INTRAMUSCULAR | Status: AC
  Filled 2020-12-01: qty 2

## 2020-12-01 MED ORDER — LACTATED RINGERS IV SOLN: INTRAVENOUS | Status: DC

## 2020-12-01 MED ORDER — OXYCODONE HCL 5 MG PO TABS
5.0000 mg | ORAL_TABLET | Freq: Once | ORAL | Status: AC | PRN
  Administered 2020-12-01: 5 mg via ORAL

## 2020-12-01 MED ORDER — CHLORHEXIDINE GLUCONATE 0.12 % MT SOLN
15.0000 mL | Freq: Once | OROMUCOSAL | Status: AC
  Administered 2020-12-01: 15 mL via OROMUCOSAL

## 2020-12-01 MED ORDER — FENTANYL CITRATE (PF) 100 MCG/2ML IJ SOLN
INTRAMUSCULAR | Status: DC | PRN
  Administered 2020-12-01 (×2): 25 ug via INTRAVENOUS

## 2020-12-01 MED ORDER — STERILE WATER FOR IRRIGATION IR SOLN
Status: DC | PRN
  Administered 2020-12-01: 500 mL

## 2020-12-01 MED ORDER — PROMETHAZINE HCL 25 MG/ML IJ SOLN: 6.2500 mg | INTRAMUSCULAR | Status: DC | PRN

## 2020-12-01 MED ORDER — OXYCODONE HCL 5 MG/5ML PO SOLN: 5.0000 mg | Freq: Once | ORAL | Status: AC | PRN

## 2020-12-01 MED ORDER — LIDOCAINE 2% (20 MG/ML) 5 ML SYRINGE
INTRAMUSCULAR | Status: DC | PRN
  Administered 2020-12-01: 60 mg via INTRAVENOUS

## 2020-12-01 SURGICAL SUPPLY — 21 items
BAG URINE DRAIN 2000ML AR STRL (UROLOGICAL SUPPLIES) IMPLANT
BAG URINE LEG 500ML (DRAIN) ×2 IMPLANT
BAG URO CATCHER STRL LF (MISCELLANEOUS) ×2 IMPLANT
CATH FOLEY 2WAY SLVR  5CC 20FR (CATHETERS) ×2
CATH FOLEY 2WAY SLVR 5CC 20FR (CATHETERS) ×1 IMPLANT
CATH FOLEY 3WAY 30CC 22FR (CATHETERS) IMPLANT
CATH URET 5FR 28IN OPEN ENDED (CATHETERS) IMPLANT
DRAPE FOOT SWITCH (DRAPES) ×2 IMPLANT
GLOVE SURG POLYISO LF SZ8 (GLOVE) IMPLANT
GOWN STRL REUS W/TWL XL LVL3 (GOWN DISPOSABLE) ×2 IMPLANT
HOLDER FOLEY CATH W/STRAP (MISCELLANEOUS) ×2 IMPLANT
KIT TURNOVER KIT A (KITS) ×2 IMPLANT
LOOP CUT BIPOLAR 24F LRG (ELECTROSURGICAL) ×2 IMPLANT
MANIFOLD NEPTUNE II (INSTRUMENTS) ×2 IMPLANT
PACK CYSTO (CUSTOM PROCEDURE TRAY) ×2 IMPLANT
SUT ETHILON 3 0 PS 1 (SUTURE) IMPLANT
SYR 30ML LL (SYRINGE) IMPLANT
SYR TOOMEY IRRIG 70ML (MISCELLANEOUS)
SYRINGE TOOMEY IRRIG 70ML (MISCELLANEOUS) IMPLANT
TUBING CONNECTING 10 (TUBING) ×2 IMPLANT
TUBING UROLOGY SET (TUBING) IMPLANT

## 2020-12-01 NOTE — Transfer of Care (Signed)
Immediate Anesthesia Transfer of Care Note  Patient: Brian Levine  Procedure(s) Performed: CYSTOSCOPY WITH FULGERATION OF  PROSTATE (N/A )  Patient Location: PACU  Anesthesia Type:General  Level of Consciousness: awake, alert , oriented and patient cooperative  Airway & Oxygen Therapy: Patient Spontanous Breathing and Patient connected to face mask oxygen  Post-op Assessment: Report given to RN and Post -op Vital signs reviewed and stable  Post vital signs: Reviewed and stable  Last Vitals:  Vitals Value Taken Time  BP 138/113 12/01/20 1547  Temp    Pulse 65 12/01/20 1547  Resp 12 12/01/20 1547  SpO2 100 % 12/01/20 1547  Vitals shown include unvalidated device data.  Last Pain:  Vitals:   12/01/20 1240  TempSrc:   PainSc: 0-No pain      Patients Stated Pain Goal: 4 (25/95/63 8756)  Complications: No complications documented.

## 2020-12-01 NOTE — Anesthesia Postprocedure Evaluation (Signed)
Anesthesia Post Note  Patient: Brian Levine  Procedure(s) Performed: CYSTOSCOPY WITH FULGERATION OF  PROSTATE (N/A )     Patient location during evaluation: PACU Anesthesia Type: General Level of consciousness: awake and alert, patient cooperative and oriented Pain management: pain level controlled Vital Signs Assessment: post-procedure vital signs reviewed and stable Respiratory status: spontaneous breathing, nonlabored ventilation and respiratory function stable Cardiovascular status: blood pressure returned to baseline and stable Postop Assessment: no apparent nausea or vomiting Anesthetic complications: no   No complications documented.  Last Vitals:  Vitals:   12/01/20 1700 12/01/20 1715  BP: (!) 149/66 135/74  Pulse: (!) 56 (!) 56  Resp: 14 13  Temp:  36.9 C  SpO2: 100% 100%    Last Pain:  Vitals:   12/01/20 1715  TempSrc:   PainSc: 0-No pain                 Meggie Laseter,E. Jayde Daffin

## 2020-12-01 NOTE — Discharge Instructions (Signed)
CYSTOSCOPY HOME CARE INSTRUCTIONS  Activity: Rest for the remainder of the day.  Do not drive or operate equipment today.  You may resume normal activities in one to two days as instructed by your physician.   Meals: Drink plenty of liquids and eat light foods such as gelatin or soup this evening.  You may return to a normal meal plan tomorrow.  Return to Work: You may return to work in one to two days or as instructed by your physician.  Special Instructions / Symptoms: Call your physician if any of these symptoms occur:   -persistent or heavy bleeding  -bleeding which continues after first few urination  -large blood clots that are difficult to pass  -urine stream diminishes or stops completely  -fever equal to or higher than 101 degrees Farenheit.  -cloudy urine with a strong, foul odor  -severe pain  Females should always wipe from front to back after elimination.  You may feel some burning pain when you urinate.  This should disappear with time.  Applying moist heat to the lower abdomen or a hot tub bath may help relieve the pain. \  You may remove the catheter at home on Monday morning but cutting the side arm off of the catheter.  If you would prefer for [place] to remove the catheter, please call the office.   Patient Signature:  ________________________________________________________  Nurse's Signature:  ________________________________________________________

## 2020-12-01 NOTE — H&P (Signed)
I have an enlarged prostate (follow-up).  HPI: Brian Levine is a 75 year-old male established patient who is here for an enlarged prostate follow-up evaluation.  He is currently taking cardura.   09/27/20: Ho returns today in f/u. He is on doxazosin and had finasteride added at his last visit. He is getting worse and his IPSS is up to 25. He had a Urolift in 2017. His UA is clear. He had trilobar hyperplasia with a small middle lobe and a volume of 7ml on his pre Urolift cystoscopy.   09/06/20: Brian Levine returns today in f/u after a trial of tamsulosin which he didn't find to be as effective as the doxazosin. His IPSS is 22 back on the doxazosin and his PVR is 128ml. He had increased hesitancy and a reduced stream on the tamsulosin. He has no hematuria or dysuria. He couldn't get a specimen today.    07/31/20: Brian Levine returns today in f/u. He was last seen in 2017 and had a Urolift on 08/22/15. He remains on doxazosin. He has some increased LUTS with a reduced stream and intermittency. His IPSS is 15. He primarily has issues at night with the reduced stream, straining to void and intermittency. He has no hematuria or dysuria. He reports that his PSA has been ok.     AUA Symptom Score: Less than 50% of the time he has the sensation of not emptying his bladder completely when finished urinating. Less than 50% of the time he has to urinate again fewer than two hours after he has finished urinating. Almost always he has to start and stop again several times when he urinates. Less than 20% of the time he finds it difficult to postpone urination. Almost always he has a weak urinary stream. Almost always he has to push or strain to begin urination. He has to get up to urinate 2 times from the time he goes to bed until the time he gets up in the morning.   Calculated AUA Symptom Score: 22    ALLERGIES: No Allergies    MEDICATIONS: Finasteride 5 mg tablet 1 tablet PO Daily  Aspirin Ec 81 mg tablet, delayed  release Oral  Doxazosin Mesylate 1 PO Daily  Pravastatin Sodium 40 MG Oral Tablet Oral  Vitamin D TABS Oral     GU PSH: Removal of spermatocele - 2011 Remove Hydrocele - 2010       PSH Notes: Cystourethroscopy W/ Insert Permanent Adjust Transprost Implant Single, Cystourethroscopy W/ Insert Permanent Adjust Transprost Implant Additional, Surgery Excision Of Spermatocele With Epididymectomy Right, Surgery Spermatic Cord Excision Of Hydrocele Right, Appendectomy   NON-GU PSH: Appendectomy - 2010     GU PMH: BPH w/LUTS, He did worse on the tamsulosin and his IPSS is up to 22 even on back on doxazosin with a PVR of 19ml. I discussed options including resuming finasteride or considering repeat intervention, possibly with a TURP. I have given him a script for finasteride and he will return in 3 months for a repeat PVR with FR and possible cystoscopy. he was given the TURP handout and I reviewed the risks of the procedure. I reviewd the risks of a TURP including bleeding, infection, incontinence, stricture, need for secondary procedures, ejaculatory and erectile dysfunction, thrombotic events, fluid overload and anesthetic complications. I explained that 95% of men will have relief of the obstructive symptoms and about 70% will have relief of the irritative symptoms. - 09/06/2020, His symptoms have worsened. I discussed options and will switch him  to tamsulosin from doxazosin. He will return in a month for a flowrate and PVR. , - 07/31/2020, Benign prostatic hyperplasia with urinary obstruction, - 2017 Incomplete bladder emptying, PVR 167ml. - 09/06/2020 Nocturia, He has worse symptoms at night. - 09/06/2020 Weak Urinary Stream - 09/06/2020, (Stable), - 07/31/2020, Weak urinary stream, - 2017 Hydrocele, He has a moderate right hydrocele without symptoms. - 07/31/2020 History of urolithiasis, History of kidney stones - 2016 Spermatocele of epididymis, Unspec, Spermatocele - 2016 Disorder of male genital organs,  unspecified, Disorder of scrotum - 2014    NON-GU PMH: Encounter for general adult medical examination without abnormal findings, Encounter for preventive health examination - 2017    FAMILY HISTORY: Acute Myocardial Infarction - Father Death In The Family Father - Runs In Family Death In The Family Mother - Runs In Family Family Health Status Number - Runs In Family Kidney Cancer - Runs In Family   SOCIAL HISTORY: Marital Status: Married Preferred Language: English; Race: White Current Smoking Status: Patient does not smoke anymore. Has not smoked since 07/30/1995. Smoked for 30 years.   Tobacco Use Assessment Completed: Used Tobacco in last 30 days? Has never drank.  Drinks 1 caffeinated drink per day.     Notes: Never A Smoker, Retired From Work, Caffeine Use, Occupation:, Alcohol Use   REVIEW OF SYSTEMS:    GU Review Male:   Patient reports burning/ pain with urination, get up at night to urinate, stream starts and stops, trouble starting your stream, and have to strain to urinate . Patient denies frequent urination, hard to postpone urination, leakage of urine, erection problems, and penile pain.  Gastrointestinal (Upper):   Patient denies nausea, vomiting, and indigestion/ heartburn.  Gastrointestinal (Lower):   Patient denies diarrhea and constipation.  Constitutional:   Patient reports night sweats. Patient denies fever, weight loss, and fatigue.  Skin:   Patient denies skin rash/ lesion and itching.  Eyes:   Patient denies blurred vision and double vision.  Ears/ Nose/ Throat:   Patient denies sore throat and sinus problems.  Hematologic/Lymphatic:   Patient reports easy bruising. Patient denies swollen glands.  Cardiovascular:   Patient denies leg swelling and chest pains.  Respiratory:   Patient denies cough and shortness of breath.  Endocrine:   Patient denies excessive thirst.  Musculoskeletal:   Patient denies back pain and joint pain.  Neurological:   Patient denies  headaches and dizziness.  Psychologic:   Patient denies depression and anxiety.   VITAL SIGNS:      09/27/2020 02:31 PM  Weight 180 lb / 81.65 kg  Height 71 in / 180.34 cm  BP 146/79 mmHg  Pulse 70 /min  Temperature 98.4 F / 36.8 C  BMI 25.1 kg/m   MULTI-SYSTEM PHYSICAL EXAMINATION:    Constitutional: Well-nourished. No physical deformities. Normally developed. Good grooming.  Respiratory: Normal breath sounds. No labored breathing, no use of accessory muscles.   Cardiovascular: Regular rate and rhythm. No murmur, no gallop. .      Complexity of Data:  Records Review:   AUA Symptom Score, Previous Patient Records  Urine Test Review:   Urinalysis   05/04/20  PSA  Total PSA 1.44 ng/ml    PROCEDURES:         Flexible Cystoscopy - 52000  Risks, benefits, and some of the potential complications of the procedure were discussed. 40ml of 2% lidocaine jelly was instilled intraurethrally.  Cipro 500mg  given for antibiotic prophylaxis.     Meatus:  Normal size.  Normal location. Normal condition.  Urethra:  No strictures.  External Sphincter:  Normal.  Verumontanum:  Normal.  Prostate:  Obstructing. Enlarged median lobe. Moderate hyperplasia. There is a stone adherent to the right BN that is probably on a Urolift tab.   Bladder Neck:  Non-obstructing.  Ureteral Orifices:  Normal location. Normal size. Normal shape. Effluxed clear urine.  Bladder:  Mild trabeculation. No tumors. Normal mucosa. No stones.      The procedure was well tolerated and there were no complications.         Urinalysis w/Scope Dipstick Dipstick Cont'd Micro  Color: Yellow Bilirubin: Neg mg/dL WBC/hpf: NS (Not Seen)  Appearance: Clear Ketones: Neg mg/dL RBC/hpf: NS (Not Seen)  Specific Gravity: 1.025 Blood: Neg ery/uL Bacteria: NS (Not Seen)  pH: 5.5 Protein: 1+ mg/dL Cystals: NS (Not Seen)  Glucose: Neg mg/dL Urobilinogen: 0.2 mg/dL Casts: NS (Not Seen)    Nitrites: Neg Trichomonas: Not Present     Leukocyte Esterase: Neg leu/uL Mucous: Not Present      Epithelial Cells: 0 - 5/hpf      Yeast: NS (Not Seen)      Sperm: Not Present    ASSESSMENT:      ICD-10 Details  1 GU:   BPH w/LUTS - N40.1 Chronic, Worsening - He has progressive LUTS despite the addition of finasteride and has an obstructing prostate with a stone at the right BN. After reviewing the options he has agreed to cystoscopy with cystolithalopaxy and TURP. I reviewd the risks of a TURP including bleeding, infection, incontinence, stricture, need for secondary procedures, ejaculatory and erectile dysfunction, thrombotic events, fluid overload and anesthetic complications. I explained that 95% of men will have relief of the obstructive symptoms and about 70% will have relief of the irritative symptoms.   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           Document Letter(s):  Created for Patient: Clinical Summary   12/01/20: Addendum.  Damario was taken back to the OR about 2 weeks ago for persitent hematuria s/p TURP but continues to have bleeding and is here for another cystoscopy with fulguration.

## 2020-12-01 NOTE — Op Note (Signed)
Procedure: Cystoscopy with fulguration of prostatic urethra.  Preop diagnosis: Persistent hematuria status post TURP.  Postop diagnosis: Same.  Surgeon: Dr. Irine Seal.  Anesthesia: General.  Specimen: None.  Drain: 52 [place] Foley catheter.  EBL: 5 mL.  Complications: None.  Indications: The patient is a 75 year old male who had previously undergone a UroLift procedure but had recurrent voiding symptoms and subsequently underwent a TURP on 10/24/2020.  He had persistent low-grade bleeding and was taken back to the operating room on 11/08/2020 where he was found to have 1 oozing vessel in the anterior apical prostatic urethra that was managed with fulguration.  Additional adherent clot was scraped from the prostatic resection bed and additional fulguration was performed.  Despite that procedure he has continued to have some low-grade bleeding subsequently and request to be taken back for another look.  He was given DDAVP and also a trial of Foley catheter drainage without success.  He would just have initial hematuria.  Procedure: He was taken operating room where he was given 2 g of Ancef.  A general anesthetic was induced.  He was placed in lithotomy position and fitted with PAS hose.  His perineum and genitalia were prepped with Betadine solution he was draped in usual sterile fashion.  Cystoscopy was performed using a 23 [place] scope and 30 degree lens.  Examination revealed a normal urethra.  The external sphincter was intact.  The prostatic urethra was patent from the prior resection with some small areas of adherent clot and a little bit of oozing that was initially thought to the anterior.  Inspection of the bladder revealed no lesions or bleeding and ureteral orifice ease were unremarkable.  The cystoscope was replaced with a 26 French continuous-flow resectoscope sheath fitted with an Beatrix Fetters handle, bipolar loop and a 30 degree lens.  I initially fulgurated the anterior portion of  the prostate but as I progressed multiple areas of oozing kept popping up in the anterior prostate and the lateral and posterior aspects of the prostate as well as the bladder neck at the edge of the resection I continued to clean away the necrotic tissue in the resection bed that obscured areas of oozing and fulgurated generously pretty much the entire prostatic fossa.  Toward the end had to be careful not to over distend the bladder because that would produce additional oozing.  There were areas where old blood appeared to ooze from the prostatic tissue suggesting pockets of blood within the glans.  Those areas were fulgurated as well to try to illuminate any residual potential bleeding.  Once no residual bleeding was identified the bladder was evacuated free of the debris scraped from the wall.  Final inspection revealed no retained tissue and no active bleeding.  A 20 French Foley cath was placed with the aid of a catheter guide and the balloon was filled with 10 mL of sterile fluid.  The catheter was placed the leg drape bag drainage after irrigation continue to return clear.  He was taken down from lithotomy position, his anesthetic was reversed and he was moved recovery in stable condition.  There were no complications.

## 2020-12-01 NOTE — Anesthesia Procedure Notes (Addendum)
Procedure Name: Intubation Performed by: Cleda Daub, CRNA Pre-anesthesia Checklist: Patient identified, Emergency Drugs available, Suction available and Patient being monitored Patient Re-evaluated:Patient Re-evaluated prior to induction Oxygen Delivery Method: Circle system utilized Preoxygenation: Pre-oxygenation with 100% oxygen Induction Type: IV induction LMA: LMA inserted LMA Size: 4.0 Number of attempts: 1 Airway Equipment and Method: Oral airway Placement Confirmation: positive ETCO2 and breath sounds checked- equal and bilateral Tube secured with: Tape Dental Injury: Teeth and Oropharynx as per pre-operative assessment

## 2020-12-01 NOTE — Anesthesia Preprocedure Evaluation (Addendum)
Anesthesia Evaluation  Patient identified by MRN, date of birth, ID band Patient awake    Reviewed: Allergy & Precautions, NPO status , Patient's Chart, lab work & pertinent test results  History of Anesthesia Complications Negative for: history of anesthetic complications  Airway Mallampati: II  TM Distance: >3 FB Neck ROM: Full    Dental  (+) Dental Advisory Given, Teeth Intact   Pulmonary former smoker,  11/30/2020 SARS coronavirus NEG   breath sounds clear to auscultation       Cardiovascular hypertension, Pt. on medications (-) angina Rhythm:Regular Rate:Normal     Neuro/Psych negative neurological ROS     GI/Hepatic negative GI ROS, Neg liver ROS,   Endo/Other  negative endocrine ROS  Renal/GU negative Renal ROS     Musculoskeletal   Abdominal   Peds  Hematology negative hematology ROS (+)   Anesthesia Other Findings   Reproductive/Obstetrics                            Anesthesia Physical Anesthesia Plan  ASA: II  Anesthesia Plan: General   Post-op Pain Management:    Induction: Intravenous  PONV Risk Score and Plan: 2 and Ondansetron, Dexamethasone and Treatment may vary due to age or medical condition  Airway Management Planned: LMA  Additional Equipment: None  Intra-op Plan:   Post-operative Plan:   Informed Consent: I have reviewed the patients History and Physical, chart, labs and discussed the procedure including the risks, benefits and alternatives for the proposed anesthesia with the patient or authorized representative who has indicated his/her understanding and acceptance.     Dental advisory given  Plan Discussed with: CRNA and Surgeon  Anesthesia Plan Comments:        Anesthesia Quick Evaluation

## 2020-12-02 ENCOUNTER — Encounter (HOSPITAL_COMMUNITY): Payer: Self-pay | Admitting: Urology

## 2020-12-02 LAB — CBC
HCT: 39.1 % (ref 39.0–52.0)
Hemoglobin: 13.2 g/dL (ref 13.0–17.0)
MCH: 31.9 pg (ref 26.0–34.0)
MCHC: 33.8 g/dL (ref 30.0–36.0)
MCV: 94.4 fL (ref 80.0–100.0)
Platelets: 148 10*3/uL — ABNORMAL LOW (ref 150–400)
RBC: 4.14 MIL/uL — ABNORMAL LOW (ref 4.22–5.81)
RDW: 13.3 % (ref 11.5–15.5)
WBC: 3.9 10*3/uL — ABNORMAL LOW (ref 4.0–10.5)
nRBC: 0 % (ref 0.0–0.2)

## 2020-12-15 DIAGNOSIS — R310 Gross hematuria: Secondary | ICD-10-CM | POA: Diagnosis not present

## 2020-12-18 NOTE — Progress Notes (Signed)
HEMATOLOGY/ONCOLOGY CONSULTATION NOTE  Date of Service: 12/19/2020  Patient Care Team: Lavone Orn, MD as PCP - General (Internal Medicine)  CHIEF COMPLAINTS/PURPOSE OF CONSULTATION:  Prostate Bleeding  HISTORY OF PRESENTING ILLNESS:  Brian Levine is a wonderful 75 y.o. male who has been referred to [place] by [facility] for evaluation and management of bleeding from the prostate following a turp procedure. The pt reports that he is doing well overall.  The pt reports that he has had continued prostate bleeding following a TUR procedure on March 29. He had 2-3 weeks of continued bleeding before any procedure where they cauterized leaking blood vessels multiple times now on April 13 and May 6. He was given ddAVP once to try and stop the bleeding. The pt is still bleeding even after this. The pt denies any history of excessive bleeding after surgery or any familial history of bleeding issues. He has never experienced bleeding like this in his life prior or with any other surgeries he has had. He has never experienced any abnormal bruising nor has ever been on any blood thinners. He took a Baby ASA for a short period of time, but not except many years ago. The pt has no unusual food preferences nor was there nay concern for liver issues or problems with his liver. Outside of the surgery, the pt denies any bleeding issues or spontaneous bruising.   The pt notes he had a UroLift five years ago prior to getting the turp procedure recently. He notes he currently has visible urine in his blood that starts as a bright red color and is highly visible. This dilutes out as more urine comes out. He notes that he has now had to go to the restroom 3-4 times each night, uncommon and not his baseline activity. The pt notes that he had a catheter for 3-4 days twice, but this caused no changes and the blood was just in the bag. The first cauterization lasted 5 days without bleeding and the second lasted 7 days.  The pt does not currently see any blood clots after seeing them until the second cauterization./  Lab results 12/15/2020 of CBC w/diff is as follows: all values are WNL except for WBC of 4.2K, RBC of 4.11, Hgb of 13.0, HCT of 38.1, Plt of 146K. 12/15/2020 PTT of 27.3.  On review of systems, pt reports initial and terminal hematuria, frequent urination during the night and denies infection issues, leg swelling, abdominal pain, back pain, and any other symptoms.  MEDICAL HISTORY:  Past Medical History:  Diagnosis Date  . BPH (benign prostatic hyperplasia) 2004  . Constipation   . Other pancytopenia ([place]) 05/04/2013  . Thrombocytopenia, unspecified ([place]) 08/17/2013  . Weight loss 05/04/2013    SURGICAL HISTORY: Past Surgical History:  Procedure Laterality Date  . APPENDECTOMY    . COLONOSCOPY WITH PROPOFOL N/A 07/17/2015   Procedure: COLONOSCOPY WITH PROPOFOL;  Surgeon: Garlan Fair, MD;  Location: [facility];  Service: Endoscopy;  Laterality: N/A;  . CYSTOSCOPY WITH FULGERATION N/A 11/08/2020   Procedure: CYSTOSCOPY WITH FULGERATION OF BLEEDERS;  Surgeon: Irine Seal, MD;  Location: [facility];  Service: Urology;  Laterality: N/A;  . CYSTOSCOPY WITH FULGERATION N/A 12/01/2020   Procedure: [place];  Surgeon: Irine Seal, MD;  Location: [facility];  Service: Urology;  Laterality: N/A;  . CYSTOSCOPY WITH INSERTION OF UROLIFT N/A 08/22/2015   Procedure: CYSTOSCOPY WITH INSERTION OF UROLIFT;  Surgeon: Irine Seal, MD;  Location:  [facility];  Service: Urology;  Laterality: N/A;  . CYSTOSCOPY WITH LITHOLAPAXY N/A 10/24/2020   Procedure: CYSTOSCOPY WITH LITHOLAPAXY;  Surgeon: Irine Seal, MD;  Location[facility];  Service: Urology;  Laterality: N/A;  REQUESTING 1 HR FOR CASE  . EYE SURGERY     right cataract surgery with lens implant  . TESTICLE SURGERY Right 2011   hydrocele  . TRANSURETHRAL RESECTION OF PROSTATE N/A 10/24/2020   Procedure: TRANSURETHRAL RESECTION OF THE  PROSTATE (TURP);  Surgeon: Irine Seal, MD;  [facility];  Service: Urology;  Laterality: N/A;    SOCIAL HISTORY: Social History   Socioeconomic History  . Marital status: Married    Spouse name: Not on file  . Number of children: Not on file  . Years of education: Not on file  . Highest education level: Not on file  Occupational History  . Not on file  Tobacco Use  . Smoking status: Former Smoker    Packs/day: 1.50    Years: 30.00    Pack years: 45.00    Quit date: 07/30/1995    Years since quitting: 25.4  . Smokeless tobacco: Never Used  Vaping Use  . Vaping Use: Never used  Substance and Sexual Activity  . Alcohol use: No  . Drug use: No  . Sexual activity: Not on file  Other Topics Concern  . Not on file  Social History Narrative  . Not on file   Social Determinants of Health   Financial Resource Strain: Not on file  Food Insecurity: Not on file  Transportation Needs: Not on file  Physical Activity: Not on file  Stress: Not on file  Social Connections: Not on file  Intimate Partner Violence: Not on file    FAMILY HISTORY: No family history on file.  ALLERGIES:  has No Known Allergies.  MEDICATIONS:  Current Outpatient Medications  Medication Sig Dispense Refill  . acetaminophen (TYLENOL) 325 MG tablet Take 650 mg by mouth every 6 (six) hours as needed (for pain.).    Marland Kitchen doxazosin (CARDURA) 8 MG tablet Take 8 mg by mouth daily in the afternoon.    . finasteride (PROSCAR) 5 MG tablet Take 5 mg by mouth daily in the afternoon.    . pravastatin (PRAVACHOL) 40 MG tablet Take 40 mg by mouth daily before supper.     No current facility-administered medications for this visit.    REVIEW OF SYSTEMS:    10 Point review of Systems was done is negative except as noted above.  PHYSICAL EXAMINATION: ECOG PERFORMANCE STATUS: 1 - Symptomatic but completely ambulatory  . Vitals:   12/19/20 1306  BP: (!) 128/58  Pulse: 85  Resp: 17  Temp: 97.6 F (36.4 C)   SpO2: 100%   Filed Weights   12/19/20 1306  Weight: 184 lb 12.8 oz (83.8 kg)   .Body mass index is 25.06 kg/m.   GENERAL:alert, in no acute distress and comfortable SKIN: no acute rashes, no significant lesions EYES: conjunctiva are pink and non-injected, sclera anicteric OROPHARYNX: MMM, no exudates, no oropharyngeal erythema or ulceration NECK: supple, no JVD LYMPH:  no palpable lymphadenopathy in the cervical, axillary or inguinal regions LUNGS: clear to auscultation b/l with normal respiratory effort HEART: regular rate & rhythm ABDOMEN:  normoactive bowel sounds , non tender, not distended. Extremity: no pedal edema PSYCH: alert & oriented x 3 with fluent speech NEURO: no focal motor/sensory deficits  LABORATORY DATA:  I have reviewed the data as listed  . CBC Latest  Ref Rng & Units 12/01/2020 12/01/2020 11/08/2020  WBC 4.0 - 10.5 K/uL 3.4(L) 3.9(L) 3.6(L)  Hemoglobin 13.0 - 17.0 g/dL 12.0(L) 13.2 11.7(L)  Hematocrit 39.0 - 52.0 % 35.0(L) 39.1 33.9(L)  Platelets 150 - 400 K/uL 121(L) 148(L) 136(L)    . CMP Latest Ref Rng & Units 12/01/2020 11/05/2020 08/17/2013  Glucose 70 - 99 mg/dL 110(H) 114(H) 98  BUN 8 - 23 mg/dL 12 13 12.1  Creatinine 0.61 - 1.24 mg/dL 1.00 1.01 1.1  Sodium 135 - 145 mmol/L 138 138 140  Potassium 3.5 - 5.1 mmol/L 3.9 3.9 4.5  Chloride 98 - 111 mmol/L 106 103 -  CO2 22 - 32 mmol/L '24 27 27  ' Calcium 8.9 - 10.3 mg/dL 9.4 9.6 9.5  Total Protein 6.5 - 8.1 g/dL - 7.2 7.1  Total Bilirubin 0.3 - 1.2 mg/dL - 1.3(H) 1.04  Alkaline Phos 38 - 126 U/L - 59 56  AST 15 - 41 U/L - 19 19  ALT 0 - 44 U/L - 19 16     RADIOGRAPHIC STUDIES: I have personally reviewed the radiological images as listed and agreed with the findings in the report. No results found.  ASSESSMENT & PLAN:   75 yo with   1) Recurrent/persistent hematuria bleeding after prostate surgery. No fhx or previous personal h/o bleeding issues. PLAN: -Advised pt that the bleeding does not  appear to be due to a blood disorder and due to surgery effects, but we will still evaluate this. -Advised pt that he does not have any history of CKD and his kidney levels are normal. This does not appear to be a limiting factor or driver for the bleeding. -Discussed paraproteinemias and abnormal bleeding. Will send labwork to evaluate this. -Discussed pt's hx of bleeding and lack thereof. This really rules against a primary blood disorder. -Advised pt based on his symptoms it is unlike to be bleeding of kidney pathology. -Advised pt that it would be okay to proceed with his tooth extraction as desired. This site is compressible and can locally control this if bleeding. -Discussed surgical pathology-- benign. -Recommended pt f/u w Dr. Jeffie Pollock regarding any local factors that could improve his bleeding. -Will get labs today -Will see back via phone in 1 week.  . Orders Placed This Encounter  Procedures  . CBC with Differential/Platelet    Standing Status:   Future    Number of Occurrences:   1    Standing Expiration Date:   12/19/2021  . APTT    Standing Status:   Future    Number of Occurrences:   1    Standing Expiration Date:   12/19/2021  . Protime-INR    Standing Status:   Future    Number of Occurrences:   1    Standing Expiration Date:   12/19/2021  . Von Willebrand panel    Standing Status:   Future    Number of Occurrences:   1    Standing Expiration Date:   12/19/2021  . Von Willebrand Multimeric    Standing Status:   Future    Number of Occurrences:   1    Standing Expiration Date:   12/19/2021  . Ferritin    Standing Status:   Future    Number of Occurrences:   1    Standing Expiration Date:   12/19/2021  . Thrombin time    Standing Status:   Future    Number of Occurrences:   1    Standing Expiration  Date:   12/19/2021  . Multiple Myeloma Panel (SPEP&IFE w/QIG)    Standing Status:   Future    Number of Occurrences:   1    Standing Expiration Date:   12/19/2021  .  Kappa/lambda light chains    Standing Status:   Future    Number of Occurrences:   1    Standing Expiration Date:   12/19/2021  . Fibrinogen    Standing Status:   Future    Number of Occurrences:   1    Standing Expiration Date:   12/19/2021  . Platelet function assay    Standing Status:   Future    Number of Occurrences:   1    Standing Expiration Date:   12/19/2021  . Factor 13 activity    Standing Status:   Future    Number of Occurrences:   1    Standing Expiration Date:   12/19/2021     FOLLOW UP: Labs today Phone visit with Dr Irene Limbo in 1 week (okay to overbook on non Friday).    All of the patients questions were answered with apparent satisfaction. The patient knows to call the clinic with any problems, questions or concerns.  I spent 40 minutes counseling the patient face to face. The total time spent in the appointment was 60 minutes and more than 50% was on counseling and direct patient cares.    Sullivan Lone MD [place] AAHIVMS [facility] Hematology/Oncology Physician [facility]  (Office):       [phone] (Work cell):  [phone] (Fax):           [phone]  12/19/2020 1:55 PM  I, Reinaldo Raddle, am acting as scribe for Dr. Sullivan Lone, MD.   .I have reviewed the above documentation for accuracy and completeness, and I agree with the above. Brunetta Genera MD

## 2020-12-19 ENCOUNTER — Inpatient Hospital Stay: Payer: Medicare HMO

## 2020-12-19 ENCOUNTER — Inpatient Hospital Stay: Payer: Medicare HMO | Attending: Hematology | Admitting: Hematology

## 2020-12-19 ENCOUNTER — Other Ambulatory Visit: Payer: Self-pay

## 2020-12-19 VITALS — BP 128/58 | HR 85 | Temp 97.6°F | Resp 17 | Ht 72.0 in | Wt 184.8 lb

## 2020-12-19 DIAGNOSIS — N421 Congestion and hemorrhage of prostate: Secondary | ICD-10-CM | POA: Insufficient documentation

## 2020-12-19 DIAGNOSIS — Z87891 Personal history of nicotine dependence: Secondary | ICD-10-CM | POA: Diagnosis not present

## 2020-12-19 DIAGNOSIS — D699 Hemorrhagic condition, unspecified: Secondary | ICD-10-CM

## 2020-12-19 DIAGNOSIS — Z79899 Other long term (current) drug therapy: Secondary | ICD-10-CM | POA: Insufficient documentation

## 2020-12-19 DIAGNOSIS — N029 Recurrent and persistent hematuria with unspecified morphologic changes: Secondary | ICD-10-CM | POA: Diagnosis not present

## 2020-12-19 LAB — CBC WITH DIFFERENTIAL/PLATELET
Abs Immature Granulocytes: 0.01 K/uL (ref 0.00–0.07)
Basophils Absolute: 0 K/uL (ref 0.0–0.1)
Basophils Relative: 0 %
Eosinophils Absolute: 0.1 K/uL (ref 0.0–0.5)
Eosinophils Relative: 3 %
HCT: 35.7 % — ABNORMAL LOW (ref 39.0–52.0)
Hemoglobin: 12.8 g/dL — ABNORMAL LOW (ref 13.0–17.0)
Immature Granulocytes: 0 %
Lymphocytes Relative: 28 %
Lymphs Abs: 1 K/uL (ref 0.7–4.0)
MCH: 31.5 pg (ref 26.0–34.0)
MCHC: 35.9 g/dL (ref 30.0–36.0)
MCV: 87.9 fL (ref 80.0–100.0)
Monocytes Absolute: 0.3 K/uL (ref 0.1–1.0)
Monocytes Relative: 8 %
Neutro Abs: 2.1 K/uL (ref 1.7–7.7)
Neutrophils Relative %: 61 %
Platelets: 126 K/uL — ABNORMAL LOW (ref 150–400)
RBC: 4.06 MIL/uL — ABNORMAL LOW (ref 4.22–5.81)
RDW: 12.5 % (ref 11.5–15.5)
WBC: 3.4 K/uL — ABNORMAL LOW (ref 4.0–10.5)
nRBC: 0 % (ref 0.0–0.2)

## 2020-12-19 LAB — PLATELET FUNCTION ASSAY: Collagen / Epinephrine: 134 s (ref 0–193)

## 2020-12-19 LAB — APTT: aPTT: 25 seconds (ref 24–36)

## 2020-12-19 LAB — PROTIME-INR
INR: 1.1 (ref 0.8–1.2)
Prothrombin Time: 13.9 s (ref 11.4–15.2)

## 2020-12-19 LAB — FERRITIN: Ferritin: 65 ng/mL (ref 24–336)

## 2020-12-19 LAB — FIBRINOGEN: Fibrinogen: 289 mg/dL (ref 210–475)

## 2020-12-20 ENCOUNTER — Telehealth: Payer: Self-pay | Admitting: Hematology

## 2020-12-20 LAB — VON WILLEBRAND PANEL
Coagulation Factor VIII: 134 % (ref 56–140)
Ristocetin Co-factor, Plasma: 159 % (ref 50–200)
Von Willebrand Antigen, Plasma: 189 % (ref 50–200)

## 2020-12-20 LAB — KAPPA/LAMBDA LIGHT CHAINS
Kappa free light chain: 15.8 mg/L (ref 3.3–19.4)
Kappa, lambda light chain ratio: 0.01 — ABNORMAL LOW (ref 0.26–1.65)
Lambda free light chains: 2517.1 mg/L — ABNORMAL HIGH (ref 5.7–26.3)

## 2020-12-20 LAB — COAG STUDIES INTERP REPORT

## 2020-12-20 LAB — THROMBIN TIME: Thrombin Time: 18.5 s (ref 0.0–23.0)

## 2020-12-20 NOTE — Telephone Encounter (Signed)
Scheduled follow-up appointment per 5/24 los. Patient is aware.

## 2020-12-22 LAB — MULTIPLE MYELOMA PANEL, SERUM
Albumin SerPl Elph-Mcnc: 3.8 g/dL (ref 2.9–4.4)
Albumin/Glob SerPl: 1.6 (ref 0.7–1.7)
Alpha 1: 0.3 g/dL (ref 0.0–0.4)
Alpha2 Glob SerPl Elph-Mcnc: 0.7 g/dL (ref 0.4–1.0)
B-Globulin SerPl Elph-Mcnc: 0.9 g/dL (ref 0.7–1.3)
Gamma Glob SerPl Elph-Mcnc: 0.6 g/dL (ref 0.4–1.8)
Globulin, Total: 2.4 g/dL (ref 2.2–3.9)
IgA: 134 mg/dL (ref 61–437)
IgG (Immunoglobin G), Serum: 632 mg/dL (ref 603–1613)
IgM (Immunoglobulin M), Srm: 20 mg/dL (ref 15–143)
M Protein SerPl Elph-Mcnc: 0.4 g/dL — ABNORMAL HIGH
Total Protein ELP: 6.2 g/dL (ref 6.0–8.5)

## 2020-12-22 LAB — FACTOR 13 ACTIVITY: Factor XIII, Qualitative: NORMAL

## 2020-12-25 NOTE — Progress Notes (Signed)
HEMATOLOGY/ONCOLOGY CONSULTATION NOTE  Date of Service: 12/26/2020  Patient Care Team: Lavone Orn, MD as PCP - General (Internal Medicine)  CHIEF COMPLAINTS/PURPOSE OF CONSULTATION:  Prostate Bleeding  HISTORY OF PRESENTING ILLNESS:   Brian Levine is a wonderful 75 y.o. male who has been referred to [place] by [facility] for evaluation and management of bleeding from the prostate following a turp procedure. The pt reports that he is doing well overall.  The pt reports that he has had continued prostate bleeding following a TUR procedure on March 29. He had 2-3 weeks of continued bleeding before any procedure where they cauterized leaking blood vessels multiple times now on April 13 and May 6. He was given ddAVP once to try and stop the bleeding. The pt is still bleeding even after this. The pt denies any history of excessive bleeding after surgery or any familial history of bleeding issues. He has never experienced bleeding like this in his life prior or with any other surgeries he has had. He has never experienced any abnormal bruising nor has ever been on any blood thinners. He took a Baby ASA for a short period of time, but not except many years ago. The pt has no unusual food preferences nor was there nay concern for liver issues or problems with his liver. Outside of the surgery, the pt denies any bleeding issues or spontaneous bruising.   The pt notes he had a UroLift five years ago prior to getting the turp procedure recently. He notes he currently has visible urine in his blood that starts as a bright red color and is highly visible. This dilutes out as more urine comes out. He notes that he has now had to go to the restroom 3-4 times each night, uncommon and not his baseline activity. The pt notes that he had a catheter for 3-4 days twice, but this caused no changes and the blood was just in the bag. The first cauterization lasted 5 days without bleeding and the second lasted 7  days. The pt does not currently see any blood clots after seeing them until the second cauterization./  Lab results 12/15/2020 of CBC w/diff is as follows: all values are WNL except for WBC of 4.2K, RBC of 4.11, Hgb of 13.0, HCT of 38.1, Plt of 146K. 12/15/2020 PTT of 27.3.  On review of systems, pt reports initial and terminal hematuria, frequent urination during the night and denies infection issues, leg swelling, abdominal pain, back pain, and any other symptoms.  INTERVAL HISTORY: I connected with Brian Levine on 12/26/2020 by telephone and verified that I am speaking with the correct person using two identifiers.   I discussed the limitations of evaluation and management by telemedicine. The patient expressed understanding and agreed to proceed.   Other persons participating in the visit and their role in the encounter:                                                         - Brian Levine, Medical Scribe     Patient's location: Home Provider's location: [place] at [facility] is here for f/u regarding evaluation and management of continued bleeding from the prostate after surgery. The patient's last visit with [place] was on 12/19/2020. The pt reports that he  is doing well overall.  The pt reports that his prostate bleeding has stopped for the last five days. He has only seem trace amounts in the urine. The pt has also done some exercise and this has not aggravated this at all. He notes the bleeding stopped all on his own this time and he has not had any additional cauterizations.   Lab results 12/19/2020 of CBC w/diff and CMP is as follows: all values are WNL except for WBC of 3.4K, RBC of 4.06, Hgb of 12.8, HCT of 35.7, Plt of 126K. 12/19/2020 Ferritin of 65. 12/19/2020 Plt function normal. 12/19/2020 Fibrinogen of 289. 12/19/2020 MMP WNL except m-protein of 0.4. 12/19/2020 Protime INR WNL. 12/19/2020 aPTT of 25. 12/19/2020 Factor 13 normal. 12/19/2020 Lambda light chains of  2517.1, Kappa lamba ratio of 0.01. 12/19/2020 Thrombin time of 18.5. 12/19/2020 Von Willebrand panel WNL. 12/19/2020 Coag studies not consistent with VWD.  On review of systems, pt reports cessaetation of prostate bleeding and denies any other symptoms.  MEDICAL HISTORY:  Past Medical History:  Diagnosis Date  . BPH (benign prostatic hyperplasia) 2004  . Constipation   . Other pancytopenia ([place]) 05/04/2013  . Thrombocytopenia, unspecified ([place]) 08/17/2013  . Weight loss 05/04/2013    SURGICAL HISTORY: Past Surgical History:  Procedure Laterality Date  . APPENDECTOMY    . COLONOSCOPY WITH PROPOFOL N/A 07/17/2015   Procedure: COLONOSCOPY WITH PROPOFOL;  Surgeon: Garlan Fair, MD;  [facility];  Service: Endoscopy;  Laterality: N/A;  . CYSTOSCOPY WITH FULGERATION N/A 11/08/2020   Procedure: CYSTOSCOPY WITH FULGERATION OF BLEEDERS;  Surgeon: Irine Seal, MD;  [facility];  Service: Urology;  Laterality: N/A;  . CYSTOSCOPY WITH FULGERATION N/A 12/01/2020   Procedure: [place];  Surgeon: Irine Seal, MD;  [facility];  Service: Urology;  Laterality: N/A;  . CYSTOSCOPY WITH INSERTION OF UROLIFT N/A 08/22/2015   Procedure: CYSTOSCOPY WITH INSERTION OF UROLIFT;  Surgeon: Irine Seal, MD;  [facility];  Service: Urology;  Laterality: N/A;  . CYSTOSCOPY WITH LITHOLAPAXY N/A 10/24/2020   Procedure: CYSTOSCOPY WITH LITHOLAPAXY;  Surgeon: Irine Seal, MD;  [facility];  Service: Urology;  Laterality: N/A;  REQUESTING 1 HR FOR CASE  . EYE SURGERY     right cataract surgery with lens implant  . TESTICLE SURGERY Right 2011   hydrocele  . TRANSURETHRAL RESECTION OF PROSTATE N/A 10/24/2020   Procedure: TRANSURETHRAL RESECTION OF THE PROSTATE (TURP);  Surgeon: Irine Seal, MD;  [facility];  Service: Urology;  Laterality: N/A;    SOCIAL HISTORY: Social History   Socioeconomic History  . Marital status: Married    Spouse name: Not  on file  . Number of children: Not on file  . Years of education: Not on file  . Highest education level: Not on file  Occupational History  . Not on file  Tobacco Use  . Smoking status: Former Smoker    Packs/day: 1.50    Years: 30.00    Pack years: 45.00    Quit date: 07/30/1995    Years since quitting: 25.4  . Smokeless tobacco: Never Used  Vaping Use  . Vaping Use: Never used  Substance and Sexual Activity  . Alcohol use: No  . Drug use: No  . Sexual activity: Not on file  Other Topics Concern  . Not on file  Social History Narrative  . Not on file   Social Determinants of Health   Financial Resource Strain: Not  on file  Food Insecurity: Not on file  Transportation Needs: Not on file  Physical Activity: Not on file  Stress: Not on file  Social Connections: Not on file  Intimate Partner Violence: Not on file    FAMILY HISTORY: No family history on file.  ALLERGIES:  has No Known Allergies.  MEDICATIONS:  Current Outpatient Medications  Medication Sig Dispense Refill  . acetaminophen (TYLENOL) 325 MG tablet Take 650 mg by mouth every 6 (six) hours as needed (for pain.).    Marland Kitchen doxazosin (CARDURA) 8 MG tablet Take 8 mg by mouth daily in the afternoon.    . finasteride (PROSCAR) 5 MG tablet Take 5 mg by mouth daily in the afternoon.    . pravastatin (PRAVACHOL) 40 MG tablet Take 40 mg by mouth daily before supper.     No current facility-administered medications for this visit.    REVIEW OF SYSTEMS:    10 Point review of Systems was done is negative except as noted above.  PHYSICAL EXAMINATION: ECOG PERFORMANCE STATUS: 1 - Symptomatic but completely ambulatory  . There were no vitals filed for this visit. There were no vitals filed for this visit. .There is no height or weight on file to calculate BMI.  Telehealth Visit.  LABORATORY DATA:  I have reviewed the data as listed  . CBC Latest Ref Rng & Units 12/19/2020 12/01/2020 12/01/2020  WBC 4.0 - 10.5  K/uL 3.4(L) 3.4(L) 3.9(L)  Hemoglobin 13.0 - 17.0 g/dL 12.8(L) 12.0(L) 13.2  Hematocrit 39.0 - 52.0 % 35.7(L) 35.0(L) 39.1  Platelets 150 - 400 K/uL 126(L) 121(L) 148(L)    . CMP Latest Ref Rng & Units 12/01/2020 11/05/2020 08/17/2013  Glucose 70 - 99 mg/dL 110(H) 114(H) 98  BUN 8 - 23 mg/dL 12 13 12.1  Creatinine 0.61 - 1.24 mg/dL 1.00 1.01 1.1  Sodium 135 - 145 mmol/L 138 138 140  Potassium 3.5 - 5.1 mmol/L 3.9 3.9 4.5  Chloride 98 - 111 mmol/L 106 103 -  CO2 22 - 32 mmol/L '24 27 27  ' Calcium 8.9 - 10.3 mg/dL 9.4 9.6 9.5  Total Protein 6.5 - 8.1 g/dL - 7.2 7.1  Total Bilirubin 0.3 - 1.2 mg/dL - 1.3(H) 1.04  Alkaline Phos 38 - 126 U/L - 59 56  AST 15 - 41 U/L - 19 19  ALT 0 - 44 U/L - 19 16     RADIOGRAPHIC STUDIES: I have personally reviewed the radiological images as listed and agreed with the findings in the report. No results found.  ASSESSMENT & PLAN:   75 yo with   1) Recurrent/persistent hematuria bleeding after prostate surgery. No fhx or previous personal h/o bleeding issues.   PLAN: -Discussed pt recent labwork, 12/19/2020; counts stable, Plt function normal, common clotting proteins all normal, fibrinogen normal, Von Willebrand normal and not consistent with VWD. -Advised pt that his light chains showed abnormal results of greatly elevated Lambda light chains. This is significantly elevated nearly 100x normal. This is concerning for multiple myeloma. -Advised pt that this finding was not expected and is concerning. We cannot be completely sure if this is causing the pt's symptoms though, given his thrombin time being normal.  -Discussed paraproteinemia and abnormal bleeding. We do not believe if this is related to all of his bleeding. -Discussed further evaluation-- additional blood tests, 24 hr UPEP, PET CT scan, and Bm Bx. The pt is agreeable to doing this. -Would consider possibility of steroids and Lysteda if there is further bleeding that  occurs. -Recommend pt  increase daily intake of water to flush kidneys. -Labs, PET CT, Bm Bx in 1-2 weeks. -Will see back in 4 weeks with labs.   No orders of the defined types were placed in this encounter.    FOLLOW UP: Labs in 1-2 weeks CT bone marrow aspiration and biopsy in 1-2 weeks PET/CT in 1-2 weeks RTC with Dr Irene Limbo in 4 weeks   All of the patients questions were answered with apparent satisfaction. The patient knows to call the clinic with any problems, questions or concerns.  The total time spent in the appointment was 20 minutes and more than 50% was on counseling and direct patient cares.     Sullivan Lone MD [place] AAHIVMS [facility] Hematology/Oncology Physician [facility]  (Office):       [phone] (Work cell):  [phone] (Fax):           [phone]  12/26/2020 2:23 PM  I, Brian Levine, am acting as scribe for Dr. Sullivan Lone, MD.  .I have reviewed the above documentation for accuracy and completeness, and I agree with the above. Brunetta Genera MD

## 2020-12-26 ENCOUNTER — Inpatient Hospital Stay (HOSPITAL_BASED_OUTPATIENT_CLINIC_OR_DEPARTMENT_OTHER): Payer: Medicare HMO | Admitting: Hematology

## 2020-12-26 DIAGNOSIS — D699 Hemorrhagic condition, unspecified: Secondary | ICD-10-CM

## 2020-12-26 DIAGNOSIS — N421 Congestion and hemorrhage of prostate: Secondary | ICD-10-CM | POA: Diagnosis not present

## 2020-12-26 DIAGNOSIS — D472 Monoclonal gammopathy: Secondary | ICD-10-CM | POA: Diagnosis not present

## 2020-12-26 DIAGNOSIS — Z87891 Personal history of nicotine dependence: Secondary | ICD-10-CM | POA: Diagnosis not present

## 2020-12-26 DIAGNOSIS — Z79899 Other long term (current) drug therapy: Secondary | ICD-10-CM | POA: Diagnosis not present

## 2020-12-26 DIAGNOSIS — N029 Recurrent and persistent hematuria with unspecified morphologic changes: Secondary | ICD-10-CM | POA: Diagnosis not present

## 2020-12-27 ENCOUNTER — Telehealth: Payer: Self-pay | Admitting: Hematology

## 2020-12-27 NOTE — Telephone Encounter (Signed)
Scheduled follow-up appointments per 5/31 los. Patient is aware. 

## 2021-01-01 LAB — VON WILLEBRAND FACTOR MULTIMER

## 2021-01-02 ENCOUNTER — Other Ambulatory Visit: Payer: Self-pay

## 2021-01-02 ENCOUNTER — Inpatient Hospital Stay: Payer: Medicare HMO | Attending: Hematology

## 2021-01-02 DIAGNOSIS — D699 Hemorrhagic condition, unspecified: Secondary | ICD-10-CM

## 2021-01-02 DIAGNOSIS — R8271 Bacteriuria: Secondary | ICD-10-CM | POA: Diagnosis not present

## 2021-01-02 DIAGNOSIS — N421 Congestion and hemorrhage of prostate: Secondary | ICD-10-CM | POA: Diagnosis not present

## 2021-01-02 DIAGNOSIS — D472 Monoclonal gammopathy: Secondary | ICD-10-CM

## 2021-01-02 DIAGNOSIS — R350 Frequency of micturition: Secondary | ICD-10-CM | POA: Diagnosis not present

## 2021-01-02 LAB — CBC WITH DIFFERENTIAL/PLATELET
Abs Immature Granulocytes: 0.01 10*3/uL (ref 0.00–0.07)
Basophils Absolute: 0 10*3/uL (ref 0.0–0.1)
Basophils Relative: 0 %
Eosinophils Absolute: 0.1 10*3/uL (ref 0.0–0.5)
Eosinophils Relative: 3 %
HCT: 38.9 % — ABNORMAL LOW (ref 39.0–52.0)
Hemoglobin: 13.5 g/dL (ref 13.0–17.0)
Immature Granulocytes: 0 %
Lymphocytes Relative: 34 %
Lymphs Abs: 1 10*3/uL (ref 0.7–4.0)
MCH: 30.9 pg (ref 26.0–34.0)
MCHC: 34.7 g/dL (ref 30.0–36.0)
MCV: 89 fL (ref 80.0–100.0)
Monocytes Absolute: 0.3 10*3/uL (ref 0.1–1.0)
Monocytes Relative: 10 %
Neutro Abs: 1.6 10*3/uL — ABNORMAL LOW (ref 1.7–7.7)
Neutrophils Relative %: 53 %
Platelets: 121 10*3/uL — ABNORMAL LOW (ref 150–400)
RBC: 4.37 MIL/uL (ref 4.22–5.81)
RDW: 12.5 % (ref 11.5–15.5)
WBC: 3 10*3/uL — ABNORMAL LOW (ref 4.0–10.5)
nRBC: 0 % (ref 0.0–0.2)

## 2021-01-02 LAB — LACTATE DEHYDROGENASE: LDH: 145 U/L (ref 98–192)

## 2021-01-03 ENCOUNTER — Other Ambulatory Visit: Payer: Self-pay | Admitting: Radiology

## 2021-01-03 LAB — BETA 2 MICROGLOBULIN, SERUM: Beta-2 Microglobulin: 2.6 mg/L — ABNORMAL HIGH (ref 0.6–2.4)

## 2021-01-04 ENCOUNTER — Ambulatory Visit (HOSPITAL_COMMUNITY)
Admission: RE | Admit: 2021-01-04 | Discharge: 2021-01-04 | Disposition: A | Discharge status: Home / Self Care | Payer: Medicare HMO | Source: Ambulatory Visit | Attending: Hematology | Admitting: Hematology

## 2021-01-04 ENCOUNTER — Other Ambulatory Visit: Payer: Self-pay

## 2021-01-04 ENCOUNTER — Encounter (HOSPITAL_COMMUNITY): Payer: Self-pay

## 2021-01-04 DIAGNOSIS — Z0389 Encounter for observation for other suspected diseases and conditions ruled out: Secondary | ICD-10-CM | POA: Diagnosis not present

## 2021-01-04 DIAGNOSIS — D72819 Decreased white blood cell count, unspecified: Secondary | ICD-10-CM | POA: Diagnosis not present

## 2021-01-04 DIAGNOSIS — D699 Hemorrhagic condition, unspecified: Secondary | ICD-10-CM

## 2021-01-04 DIAGNOSIS — D696 Thrombocytopenia, unspecified: Secondary | ICD-10-CM | POA: Diagnosis not present

## 2021-01-04 DIAGNOSIS — D472 Monoclonal gammopathy: Secondary | ICD-10-CM | POA: Diagnosis not present

## 2021-01-04 DIAGNOSIS — D47Z9 Other specified neoplasms of uncertain behavior of lymphoid, hematopoietic and related tissue: Secondary | ICD-10-CM | POA: Diagnosis not present

## 2021-01-04 LAB — CBC WITH DIFFERENTIAL/PLATELET
Abs Immature Granulocytes: 0.01 10*3/uL (ref 0.00–0.07)
Basophils Absolute: 0 10*3/uL (ref 0.0–0.1)
Basophils Relative: 1 %
Eosinophils Absolute: 0.1 10*3/uL (ref 0.0–0.5)
Eosinophils Relative: 3 %
HCT: 39.3 % (ref 39.0–52.0)
Hemoglobin: 13.8 g/dL (ref 13.0–17.0)
Immature Granulocytes: 0 %
Lymphocytes Relative: 39 %
Lymphs Abs: 1.2 10*3/uL (ref 0.7–4.0)
MCH: 31.4 pg (ref 26.0–34.0)
MCHC: 35.1 g/dL (ref 30.0–36.0)
MCV: 89.5 fL (ref 80.0–100.0)
Monocytes Absolute: 0.2 10*3/uL (ref 0.1–1.0)
Monocytes Relative: 8 %
Neutro Abs: 1.5 10*3/uL — ABNORMAL LOW (ref 1.7–7.7)
Neutrophils Relative %: 49 %
Platelets: 135 10*3/uL — ABNORMAL LOW (ref 150–400)
RBC: 4.39 MIL/uL (ref 4.22–5.81)
RDW: 12.8 % (ref 11.5–15.5)
WBC: 3.1 10*3/uL — ABNORMAL LOW (ref 4.0–10.5)
nRBC: 0 % (ref 0.0–0.2)

## 2021-01-04 LAB — PROTIME-INR
INR: 1 (ref 0.8–1.2)
Prothrombin Time: 13.7 seconds (ref 11.4–15.2)

## 2021-01-04 MED ORDER — ONDANSETRON HCL 4 MG/2ML IJ SOLN
INTRAMUSCULAR | Status: AC
  Filled 2021-01-04: qty 2

## 2021-01-04 MED ORDER — FENTANYL CITRATE (PF) 100 MCG/2ML IJ SOLN
INTRAMUSCULAR | Status: AC | PRN
  Administered 2021-01-04 (×2): 50 ug via INTRAVENOUS

## 2021-01-04 MED ORDER — LIDOCAINE HCL (PF) 0.5 % IJ SOLN
INTRAMUSCULAR | Status: AC | PRN
  Administered 2021-01-04: 10 mL

## 2021-01-04 MED ORDER — MIDAZOLAM HCL 2 MG/2ML IJ SOLN
INTRAMUSCULAR | Status: AC | PRN
  Administered 2021-01-04: 1 mg via INTRAVENOUS

## 2021-01-04 MED ORDER — SODIUM CHLORIDE 0.9 % IV SOLN: INTRAVENOUS | Status: DC

## 2021-01-04 MED ORDER — FENTANYL CITRATE (PF) 100 MCG/2ML IJ SOLN
INTRAMUSCULAR | Status: AC
  Filled 2021-01-04: qty 2

## 2021-01-04 MED ORDER — MIDAZOLAM HCL 2 MG/2ML IJ SOLN
INTRAMUSCULAR | Status: AC
  Filled 2021-01-04: qty 4

## 2021-01-04 NOTE — Procedures (Signed)
Pre-procedure Diagnosis: Evaluate for multiple myeloma Post-procedure Diagnosis: Same  Technically successful CT guided bone marrow aspiration and biopsy of left iliac crest.   Complications: None Immediate  EBL: None  Signed: Sandi Mariscal Pager: [phone] 01/04/2021, 9:44 AM

## 2021-01-04 NOTE — Consult Note (Signed)
Chief Complaint: Patient was seen in consultation today for CT-guided bone marrow biopsy  Referring Physician(s): Brunetta Genera  Supervising Physician: Sandi Mariscal  Patient Status: [facility] - Out-pt  History of Present Illness: Brian Levine is a 75 y.o. male with history of BPH and recurrent hematuria after prostate surgery, M spike on SPEP with elevated lambda free light chains.  He presents today for CT-guided bone marrow biopsy to rule out multiple myeloma.  Past Medical History:  Diagnosis Date   BPH (benign prostatic hyperplasia) 2004   Constipation    Other pancytopenia ([place]) 05/04/2013   Thrombocytopenia, unspecified ([place]) 08/17/2013   Weight loss 05/04/2013    Past Surgical History:  Procedure Laterality Date   APPENDECTOMY     COLONOSCOPY WITH PROPOFOL N/A 07/17/2015   Procedure: COLONOSCOPY WITH PROPOFOL;  Surgeon: Garlan Fair, MD;  Location: [facility];  Service: Endoscopy;  Laterality: N/A;   CYSTOSCOPY WITH FULGERATION N/A 11/08/2020   Procedure: CYSTOSCOPY WITH FULGERATION OF BLEEDERS;  Surgeon: Irine Seal, MD;  Location: [facility];  Service: Urology;  Laterality: N/A;   CYSTOSCOPY WITH FULGERATION N/A 12/01/2020   Procedure: CYSTOSCOPY WITH FULGERATION OF  PROSTATE;  Surgeon: Irine Seal, MD;  Location: [facility];  Service: Urology;  Laterality: N/A;   CYSTOSCOPY WITH INSERTION OF UROLIFT N/A 08/22/2015   Procedure: CYSTOSCOPY WITH INSERTION OF UROLIFT;  Surgeon: Irine Seal, MD;  Location: [facility];  Service: Urology;  Laterality: N/A;   CYSTOSCOPY WITH LITHOLAPAXY N/A 10/24/2020   Procedure: CYSTOSCOPY WITH LITHOLAPAXY;  Surgeon: Irine Seal, MD;  Location: [facility];  Service: Urology;  Laterality: N/A;  REQUESTING 1 HR FOR CASE   EYE SURGERY     right cataract surgery with lens implant   TESTICLE SURGERY Right 2011   hydrocele   TRANSURETHRAL RESECTION OF PROSTATE N/A 10/24/2020   Procedure: TRANSURETHRAL RESECTION OF THE PROSTATE (TURP);  Surgeon: Irine Seal,  MD;  Location: [facility];  Service: Urology;  Laterality: N/A;    Allergies: Patient has no known allergies.  Medications: Prior to Admission medications   Medication Sig Start Date End Date Taking? Authorizing Provider  acetaminophen (TYLENOL) 325 MG tablet Take 650 mg by mouth every 6 (six) hours as needed (for pain.).   Yes [provider]  doxazosin (CARDURA) 8 MG tablet Take 8 mg by mouth daily in the afternoon.   Yes [provider]  finasteride (PROSCAR) 5 MG tablet Take 5 mg by mouth daily in the afternoon.   Yes [provider]  pravastatin (PRAVACHOL) 40 MG tablet Take 40 mg by mouth daily before supper.   Yes [provider]     History reviewed. No pertinent family history.  Social History   Socioeconomic History   Marital status: Married    Spouse name: Not on file   Number of children: Not on file   Years of education: Not on file   Highest education level: Not on file  Occupational History   Not on file  Tobacco Use   Smoking status: Former    Packs/day: 1.50    Years: 30.00    Pack years: 45.00    Types: Cigarettes    Quit date: 07/30/1995    Years since quitting: 25.4   Smokeless tobacco: Never  Vaping Use   Vaping Use: Never used  Substance and Sexual Activity   Alcohol use: No   Drug use: No   Sexual activity: Not on file  Other Topics Concern   Not on  file  Social History Narrative   Not on file   Social Determinants of Health   Financial Resource Strain: Not on file  Food Insecurity: Not on file  Transportation Needs: Not on file  Physical Activity: Not on file  Stress: Not on file  Social Connections: Not on file     Review of Systems currently denies fever, headache, chest pain, dyspnea, cough, abdominal/back pain, nausea, vomiting  Vital Signs: BP (!) 141/73   Pulse (!) 54   Temp 97.8 F (36.6 C) (Oral)   Resp 17   SpO2 100%   Physical Exam awake, alert.  Chest clear to auscultation bilaterally.   Heart with slightly bradycardic but regular rhythm.  Abdomen soft, positive bowel sounds, nontender.  No lower extremity edema.  Imaging: No results found.  Labs:  CBC: Recent Labs    12/01/20 1609 12/19/20 1402 01/02/21 0845 01/04/21 0746  WBC 3.4* 3.4* 3.0* 3.1*  HGB 12.0* 12.8* 13.5 13.8  HCT 35.0* 35.7* 38.9* 39.3  PLT 121* 126* 121* 135*    COAGS: Recent Labs    12/01/20 1609 12/19/20 1403  INR 1.1 1.1  APTT  --  25    BMP: Recent Labs    11/05/20 1011 12/01/20 1304  NA 138 138  K 3.9 3.9  CL 103 106  CO2 27 24  GLUCOSE 114* 110*  BUN 13 12  CALCIUM 9.6 9.4  CREATININE 1.01 1.00  GFRNONAA >60 >60    LIVER FUNCTION TESTS: Recent Labs    11/05/20 1011  BILITOT 1.3*  AST 19  ALT 19  ALKPHOS 59  PROT 7.2  ALBUMIN 4.4    TUMOR MARKERS: No results for input(s): AFPTM, CEA, CA199, CHROMGRNA in the last 8760 hours.  Assessment and Plan: 75 y.o. male with history of BPH and recurrent hematuria after prostate surgery, M spike on SPEP with elevated lambda free light chains.  He presents today for CT-guided bone marrow biopsy to rule out multiple myeloma.Risks and benefits of procedure was discussed with the patient  including, but not limited to bleeding, infection, damage to adjacent structures or low yield requiring additional tests.  All of the questions were answered and there is agreement to proceed.  Consent signed and in chart.  Thank you for this interesting consult.  I greatly enjoyed meeting Brian Levine and look forward to participating in their care.  A copy of this report was sent to the requesting provider on this date.  Electronically Signed: D. Rowe Robert, PA-C 01/04/2021, 8:31 AM   I spent a total of  20 minutes   in face to face in clinical consultation, greater than 50% of which was counseling/coordinating care for CT-guided bone marrow biopsy

## 2021-01-04 NOTE — Discharge Instructions (Signed)

## 2021-01-09 ENCOUNTER — Ambulatory Visit (HOSPITAL_COMMUNITY)
Admission: RE | Admit: 2021-01-09 | Discharge: 2021-01-09 | Disposition: A | Discharge status: Home / Self Care | Payer: Medicare HMO | Source: Ambulatory Visit | Attending: Hematology | Admitting: Hematology

## 2021-01-09 ENCOUNTER — Other Ambulatory Visit: Payer: Self-pay

## 2021-01-09 DIAGNOSIS — I700 Atherosclerosis of aorta: Secondary | ICD-10-CM | POA: Diagnosis not present

## 2021-01-09 DIAGNOSIS — D472 Monoclonal gammopathy: Secondary | ICD-10-CM | POA: Diagnosis not present

## 2021-01-09 DIAGNOSIS — J439 Emphysema, unspecified: Secondary | ICD-10-CM | POA: Diagnosis not present

## 2021-01-09 DIAGNOSIS — D699 Hemorrhagic condition, unspecified: Secondary | ICD-10-CM | POA: Diagnosis not present

## 2021-01-09 DIAGNOSIS — K5730 Diverticulosis of large intestine without perforation or abscess without bleeding: Secondary | ICD-10-CM | POA: Insufficient documentation

## 2021-01-09 DIAGNOSIS — I2510 Atherosclerotic heart disease of native coronary artery without angina pectoris: Secondary | ICD-10-CM | POA: Insufficient documentation

## 2021-01-09 DIAGNOSIS — N400 Enlarged prostate without lower urinary tract symptoms: Secondary | ICD-10-CM | POA: Insufficient documentation

## 2021-01-09 DIAGNOSIS — C9000 Multiple myeloma not having achieved remission: Secondary | ICD-10-CM | POA: Diagnosis not present

## 2021-01-09 LAB — GLUCOSE, CAPILLARY: Glucose-Capillary: 104 mg/dL — ABNORMAL HIGH (ref 70–99)

## 2021-01-09 MED ORDER — FLUDEOXYGLUCOSE F - 18 (FDG) INJECTION
9.5000 | Freq: Once | INTRAVENOUS | Status: AC
  Administered 2021-01-09: 9.1 via INTRAVENOUS

## 2021-01-15 ENCOUNTER — Encounter (HOSPITAL_COMMUNITY): Payer: Self-pay | Admitting: Hematology

## 2021-01-16 DIAGNOSIS — N421 Congestion and hemorrhage of prostate: Secondary | ICD-10-CM | POA: Diagnosis not present

## 2021-01-17 ENCOUNTER — Encounter (HOSPITAL_COMMUNITY): Payer: Self-pay | Admitting: Hematology

## 2021-01-17 LAB — UPEP/UIFE/LIGHT CHAINS/TP, 24-HR UR
% BETA, Urine: 77.2 %
ALPHA 1 URINE: 0.6 %
Albumin, U: 18.6 %
Alpha 2, Urine: 2.2 %
Free Kappa Lt Chains,Ur: 19.79 mg/L (ref 1.17–86.46)
Free Kappa/Lambda Ratio: 0.01 — ABNORMAL LOW (ref 1.83–14.26)
Free Lambda Lt Chains,Ur: 2855.61 mg/L — ABNORMAL HIGH (ref 0.27–15.21)
GAMMA GLOBULIN URINE: 1.3 %
M-SPIKE %, Urine: 70.9 % — ABNORMAL HIGH
M-Spike, Mg/24 Hr: 2191 mg/24 hr — ABNORMAL HIGH
Total Protein, Urine-Ur/day: 3090 mg/24 hr — ABNORMAL HIGH (ref 30–150)
Total Protein, Urine: 116.6 mg/dL
Total Volume: 2650

## 2021-01-22 ENCOUNTER — Telehealth: Payer: Self-pay | Admitting: Hematology

## 2021-01-22 ENCOUNTER — Other Ambulatory Visit: Payer: Self-pay

## 2021-01-22 DIAGNOSIS — D472 Monoclonal gammopathy: Secondary | ICD-10-CM

## 2021-01-22 NOTE — Telephone Encounter (Signed)
Rescheduled upcoming appointment due to provider out of town. Patient is aware of changes.

## 2021-01-23 ENCOUNTER — Ambulatory Visit: Payer: Medicare HMO | Admitting: Hematology

## 2021-01-23 ENCOUNTER — Telehealth: Payer: Self-pay | Admitting: Hematology

## 2021-01-23 ENCOUNTER — Other Ambulatory Visit: Payer: Medicare HMO

## 2021-01-23 NOTE — Telephone Encounter (Signed)
Scheduled follow-up appointment. Patient is aware. 

## 2021-01-24 ENCOUNTER — Inpatient Hospital Stay: Payer: Medicare HMO

## 2021-01-24 ENCOUNTER — Other Ambulatory Visit: Payer: Self-pay

## 2021-01-24 DIAGNOSIS — N421 Congestion and hemorrhage of prostate: Secondary | ICD-10-CM | POA: Diagnosis not present

## 2021-01-24 DIAGNOSIS — D472 Monoclonal gammopathy: Secondary | ICD-10-CM

## 2021-01-24 LAB — CBC WITH DIFFERENTIAL (CANCER CENTER ONLY)
Abs Immature Granulocytes: 0 10*3/uL (ref 0.00–0.07)
Basophils Absolute: 0 10*3/uL (ref 0.0–0.1)
Basophils Relative: 1 %
Eosinophils Absolute: 0.1 10*3/uL (ref 0.0–0.5)
Eosinophils Relative: 4 %
HCT: 34.8 % — ABNORMAL LOW (ref 39.0–52.0)
Hemoglobin: 12.1 g/dL — ABNORMAL LOW (ref 13.0–17.0)
Immature Granulocytes: 0 %
Lymphocytes Relative: 40 %
Lymphs Abs: 1.2 10*3/uL (ref 0.7–4.0)
MCH: 31 pg (ref 26.0–34.0)
MCHC: 34.8 g/dL (ref 30.0–36.0)
MCV: 89.2 fL (ref 80.0–100.0)
Monocytes Absolute: 0.3 10*3/uL (ref 0.1–1.0)
Monocytes Relative: 10 %
Neutro Abs: 1.3 10*3/uL — ABNORMAL LOW (ref 1.7–7.7)
Neutrophils Relative %: 45 %
Platelet Count: 111 10*3/uL — ABNORMAL LOW (ref 150–400)
RBC: 3.9 MIL/uL — ABNORMAL LOW (ref 4.22–5.81)
RDW: 12.8 % (ref 11.5–15.5)
WBC Count: 2.9 10*3/uL — ABNORMAL LOW (ref 4.0–10.5)
nRBC: 0 % (ref 0.0–0.2)

## 2021-01-24 LAB — CMP (CANCER CENTER ONLY)
ALT: 12 U/L (ref 0–44)
AST: 17 U/L (ref 15–41)
Albumin: 3.8 g/dL (ref 3.5–5.0)
Alkaline Phosphatase: 56 U/L (ref 38–126)
Anion gap: 10 (ref 5–15)
BUN: 10 mg/dL (ref 8–23)
CO2: 27 mmol/L (ref 22–32)
Calcium: 8.9 mg/dL (ref 8.9–10.3)
Chloride: 104 mmol/L (ref 98–111)
Creatinine: 1.24 mg/dL (ref 0.61–1.24)
GFR, Estimated: >60 mL/min (ref >60)
Glucose, Bld: 105 mg/dL — ABNORMAL HIGH (ref 70–99)
Potassium: 3.9 mmol/L (ref 3.5–5.1)
Sodium: 141 mmol/L (ref 135–145)
Total Bilirubin: 0.8 mg/dL (ref 0.3–1.2)
Total Protein: 6.1 g/dL — ABNORMAL LOW (ref 6.5–8.1)

## 2021-01-26 ENCOUNTER — Other Ambulatory Visit: Payer: Self-pay

## 2021-01-26 ENCOUNTER — Inpatient Hospital Stay: Payer: Medicare HMO | Attending: Hematology | Admitting: Hematology

## 2021-01-26 DIAGNOSIS — N421 Congestion and hemorrhage of prostate: Secondary | ICD-10-CM | POA: Insufficient documentation

## 2021-01-26 DIAGNOSIS — Z298 Encounter for other specified prophylactic measures: Secondary | ICD-10-CM | POA: Insufficient documentation

## 2021-01-26 DIAGNOSIS — Z79899 Other long term (current) drug therapy: Secondary | ICD-10-CM | POA: Insufficient documentation

## 2021-01-26 DIAGNOSIS — C9000 Multiple myeloma not having achieved remission: Secondary | ICD-10-CM | POA: Diagnosis not present

## 2021-01-26 DIAGNOSIS — D696 Thrombocytopenia, unspecified: Secondary | ICD-10-CM | POA: Insufficient documentation

## 2021-01-26 DIAGNOSIS — D72819 Decreased white blood cell count, unspecified: Secondary | ICD-10-CM | POA: Insufficient documentation

## 2021-01-26 DIAGNOSIS — Z5112 Encounter for antineoplastic immunotherapy: Secondary | ICD-10-CM | POA: Insufficient documentation

## 2021-01-26 DIAGNOSIS — Z87891 Personal history of nicotine dependence: Secondary | ICD-10-CM | POA: Insufficient documentation

## 2021-01-26 NOTE — Progress Notes (Signed)
HEMATOLOGY/ONCOLOGY CONSULTATION NOTE  Date of Service: 01/26/2021  Patient Care Team: Brian Orn, MD as PCP - General (Internal Medicine)  CHIEF COMPLAINTS/PURPOSE OF CONSULTATION:  Prostate Bleeding Newly diagnosed multiple myeloma  HISTORY OF PRESENTING ILLNESS:   Brian Levine is a wonderful 75 y.o. male who has been referred to [place] by [facility] for evaluation and management of bleeding from the prostate following a turp procedure. The pt reports that he is doing well overall.  The pt reports that he has had continued prostate bleeding following a TUR procedure on March 29. He had 2-3 weeks of continued bleeding before any procedure where they cauterized leaking blood vessels multiple times now on April 13 and May 6. He was given ddAVP once to try and stop the bleeding. The pt is still bleeding even after this. The pt denies any history of excessive bleeding after surgery or any familial history of bleeding issues. He has never experienced bleeding like this in his life prior or with any other surgeries he has had. He has never experienced any abnormal bruising nor has ever been on any blood thinners. He took a Baby ASA for a short period of time, but not except many years ago. The pt has no unusual food preferences nor was there nay concern for liver issues or problems with his liver. Outside of the surgery, the pt denies any bleeding issues or spontaneous bruising.   The pt notes he had a UroLift five years ago prior to getting the turp procedure recently. He notes he currently has visible urine in his blood that starts as a bright red color and is highly visible. This dilutes out as more urine comes out. He notes that he has now had to go to the restroom 3-4 times each night, uncommon and not his baseline activity. The pt notes that he had a catheter for 3-4 days twice, but this caused no changes and the blood was just in the bag. The first cauterization lasted 5 days without  bleeding and the second lasted 7 days. The pt does not currently see any blood clots after seeing them until the second cauterization./  Lab results 12/15/2020 of CBC w/diff is as follows: all values are WNL except for WBC of 4.2K, RBC of 4.11, Hgb of 13.0, HCT of 38.1, Plt of 146K. 12/15/2020 PTT of 27.3.  On review of systems, pt reports initial and terminal hematuria, frequent urination during the night and denies infection issues, leg swelling, abdominal pain, back pain, and any other symptoms.  INTERVAL HISTORY: I connected with Brian Levine on 01/26/2021 by telephone and verified that I am speaking with the correct person using two identifiers.   I discussed the limitations of evaluation and management by telemedicine. The patient expressed understanding and agreed to proceed.   Other persons participating in the visit and their role in the encounter:                                                         - Brian Levine, Medical Scribe             - Pt's Wife     Patient's location: Home Provider's location: Home  Brian Levine is here for f/u regarding evaluation and management of continued bleeding from the prostate  after surgery. The patient's last visit with [place] was on 12/26/2020. The pt reports that he is doing well overall.  The pt reports that since the last visit he has been doing very good with no new bleeding or signs of blood in the urine. He does not have any urinary symptoms at this time. The pt notes he has returned to his baseline as of late.  Of note since the patient's last visit, pt has had CT Bone Marrow Biopsy & Aspiration on 01/04/2021, which revealed "BONE MARROW, ASPIRATE, CLOT, CORE:  -  Variably cellular marrow involved by lambda restricted plasma cell  Neoplasm. PERIPHERAL BLOOD:  -  Leukopenia and thrombocytopenia "  The pt has had NM PET Whole Body on 01/09/2021, which revealed "1. Indistinct mild focus of hypermetabolism in the medial left iliac bone  without associated discrete osseous lesion on the CT images. Metabolically active myeloma cannot be excluded in this location. 2. Otherwise no additional potential sites of hypermetabolic skeletal or extraosseous multiple myeloma. 3. Chronic findings include: Aortic Atherosclerosis (ICD10-I70.0) and Emphysema (ICD10-J43.9). Coronary atherosclerosis. Moderate prostatomegaly. Moderate sigmoid diverticulosis."  Lab results 01/24/2021 of CBC w/diff and CMP is as follows: all values are WNL except for WBC of 2.9K, RBC of 3.90, Hgb of 12.1, HCT of 34.8, Plt of 111K, Neutro Abs of 1.3K, Glucose of 105, Total protein of 6.1. 01/24/2021 MMP in progress. 01/24/2021 Light Chains in progress.  On review of systems, pt denies blood in urine, urinary symptoms, bleeding issues, and any other symptoms.   MEDICAL HISTORY:  Past Medical History:  Diagnosis Date   BPH (benign prostatic hyperplasia) 2004   Constipation    Other pancytopenia ([place]) 05/04/2013   Thrombocytopenia, unspecified ([place]) 08/17/2013   Weight loss 05/04/2013    SURGICAL HISTORY: Past Surgical History:  Procedure Laterality Date   APPENDECTOMY     COLONOSCOPY WITH PROPOFOL N/A 07/17/2015   Procedure: COLONOSCOPY WITH PROPOFOL;  Surgeon: Brian Fair, MD;  Location: [facility];  Service: Endoscopy;  Laterality: N/A;   CYSTOSCOPY WITH FULGERATION N/A 11/08/2020   Procedure: CYSTOSCOPY WITH FULGERATION OF BLEEDERS;  Surgeon: Brian Seal, MD;  Location: [facility];  Service: Urology;  Laterality: N/A;   CYSTOSCOPY WITH FULGERATION N/A 12/01/2020   Procedure: CYSTOSCOPY WITH FULGERATION OF  PROSTATE;  Surgeon: Brian Seal, MD;  Location: [facility];  Service: Urology;  Laterality: N/A;   CYSTOSCOPY WITH INSERTION OF UROLIFT N/A 08/22/2015   Procedure: CYSTOSCOPY WITH INSERTION OF UROLIFT;  Surgeon: Brian Seal, MD;  Location: [facility];  Service: Urology;  Laterality: N/A;   CYSTOSCOPY WITH LITHOLAPAXY N/A 10/24/2020   Procedure: CYSTOSCOPY WITH  LITHOLAPAXY;  Surgeon: Brian Seal, MD;  Location: [facility];  Service: Urology;  Laterality: N/A;  REQUESTING 1 HR FOR CASE   EYE SURGERY     right cataract surgery with lens implant   TESTICLE SURGERY Right 2011   hydrocele   TRANSURETHRAL RESECTION OF PROSTATE N/A 10/24/2020   Procedure: TRANSURETHRAL RESECTION OF THE PROSTATE (TURP);  Surgeon: Brian Seal, MD;  Location: [facility];  Service: Urology;  Laterality: N/A;    SOCIAL HISTORY: Social History   Socioeconomic History   Marital status: Married    Spouse name: Not on file   Number of children: Not on file   Years of education: Not on file   Highest education level: Not on file  Occupational History   Not on file  Tobacco Use   Smoking status: Former    Packs/day: 1.50  Years: 30.00    Pack years: 45.00    Types: Cigarettes    Quit date: 07/30/1995    Years since quitting: 25.5   Smokeless tobacco: Never  Vaping Use   Vaping Use: Never used  Substance and Sexual Activity   Alcohol use: No   Drug use: No   Sexual activity: Not on file  Other Topics Concern   Not on file  Social History Narrative   Not on file   Social Determinants of Health   Financial Resource Strain: Not on file  Food Insecurity: Not on file  Transportation Needs: Not on file  Physical Activity: Not on file  Stress: Not on file  Social Connections: Not on file  Intimate Partner Violence: Not on file    FAMILY HISTORY: No family history on file.  ALLERGIES:  has No Known Allergies.  MEDICATIONS:  Current Outpatient Medications  Medication Sig Dispense Refill   acetaminophen (TYLENOL) 325 MG tablet Take 650 mg by mouth every 6 (six) hours as needed (for pain.).     doxazosin (CARDURA) 8 MG tablet Take 8 mg by mouth daily in the afternoon.     finasteride (PROSCAR) 5 MG tablet Take 5 mg by mouth daily in the afternoon.     pravastatin (PRAVACHOL) 40 MG tablet Take 40 mg by mouth daily before supper.     No current facility-administered  medications for this visit.    REVIEW OF SYSTEMS:    10 Point review of Systems was done is negative except as noted above.  PHYSICAL EXAMINATION: ECOG PERFORMANCE STATUS: 1 - Symptomatic but completely ambulatory  . There were no vitals filed for this visit. There were no vitals filed for this visit. .There is no height or weight on file to calculate BMI.  Telehealth Visit.  LABORATORY DATA:  I have reviewed the data as listed  . CBC Latest Ref Rng & Units 01/24/2021 01/04/2021 01/02/2021  WBC 4.0 - 10.5 K/uL 2.9(L) 3.1(L) 3.0(L)  Hemoglobin 13.0 - 17.0 g/dL 12.1(L) 13.8 13.5  Hematocrit 39.0 - 52.0 % 34.8(L) 39.3 38.9(L)  Platelets 150 - 400 K/uL 111(L) 135(L) 121(L)    . CMP Latest Ref Rng & Units 01/24/2021 12/01/2020 11/05/2020  Glucose 70 - 99 mg/dL 105(H) 110(H) 114(H)  BUN 8 - 23 mg/dL '10 12 13  ' Creatinine 0.61 - 1.24 mg/dL 1.24 1.00 1.01  Sodium 135 - 145 mmol/L 141 138 138  Potassium 3.5 - 5.1 mmol/L 3.9 3.9 3.9  Chloride 98 - 111 mmol/L 104 106 103  CO2 22 - 32 mmol/L '27 24 27  ' Calcium 8.9 - 10.3 mg/dL 8.9 9.4 9.6  Total Protein 6.5 - 8.1 g/dL 6.1(L) - 7.2  Total Bilirubin 0.3 - 1.2 mg/dL 0.8 - 1.3(H)  Alkaline Phos 38 - 126 U/L 56 - 59  AST 15 - 41 U/L 17 - 19  ALT 0 - 44 U/L 12 - 19     RADIOGRAPHIC STUDIES: I have personally reviewed the radiological images as listed and agreed with the findings in the report. NM PET Image Initial (PI) Whole Body  Result Date: 01/09/2021 CLINICAL DATA:  Initial treatment strategy for multiple myeloma. EXAM: NUCLEAR MEDICINE PET WHOLE BODY TECHNIQUE: 9.1 mCi F-18 FDG was injected intravenously. Full-ring PET imaging was performed from the head to foot after the radiotracer. CT data was obtained and used for attenuation correction and anatomic localization. Fasting blood glucose: 104 mg/dl COMPARISON:  05/12/2013 CT chest, abdomen and pelvis. FINDINGS: Mediastinal blood pool activity:  SUV max 2.6 HEAD/NECK: No hypermetabolic  activity in the scalp. No hypermetabolic cervical lymph nodes. Incidental CT findings: none CHEST: No enlarged or hypermetabolic axillary, mediastinal or hilar lymph nodes. No hypermetabolic pulmonary findings. Incidental CT findings: Coronary atherosclerosis. Mildly atherosclerotic nonaneurysmal thoracic aorta. Moderate centrilobular and paraseptal emphysema with diffuse bronchial wall thickening. A few scattered small pulmonary nodules measuring up to 4 mm in the medial right upper lobe (series 9/image 22), below PET resolution, all stable since 05/12/2013 chest CT and considered benign. ABDOMEN/PELVIS: No abnormal hypermetabolic activity within the liver, pancreas, adrenal glands, or spleen. No hypermetabolic lymph nodes in the abdomen or pelvis. Incidental CT findings: Moderate sigmoid diverticulosis. Atherosclerotic nonaneurysmal abdominal aorta. Moderate prostatomegaly with post-TURP changes. Apparent small to moderate right hydrocele versus right epididymal spermatoceles. SKELETON: Focus of indistinct mild hypermetabolism in the medial left iliac bone with max SUV 3.7 without associated discrete osseous lesion on the CT images. Otherwise no foci of skeletal hypermetabolism. Incidental CT findings: No suspicious focal osseous lesions. EXTREMITIES: No abnormal hypermetabolic activity in the lower extremities. Incidental CT findings: none IMPRESSION: 1. Indistinct mild focus of hypermetabolism in the medial left iliac bone without associated discrete osseous lesion on the CT images. Metabolically active myeloma cannot be excluded in this location. 2. Otherwise no additional potential sites of hypermetabolic skeletal or extraosseous multiple myeloma. 3. Chronic findings include: Aortic Atherosclerosis (ICD10-I70.0) and Emphysema (ICD10-J43.9). Coronary atherosclerosis. Moderate prostatomegaly. Moderate sigmoid diverticulosis. Electronically Signed   By: Ilona Sorrel M.D.   On: 01/09/2021 13:07   CT  Biopsy  Result Date: 01/04/2021 INDICATION: Concern for multiple myeloma. Please perform CT-guided bone marrow biopsy for tissue diagnostic purposes. EXAM: CT-GUIDED BONE MARROW BIOPSY AND ASPIRATION MEDICATIONS: None ANESTHESIA/SEDATION: Fentanyl 100 mcg IV; Versed 1 mg IV Sedation Time: 10 Minutes; The patient was continuously monitored during the procedure by the interventional radiology nurse under my direct supervision. COMPLICATIONS: None immediate. PROCEDURE: Informed consent was obtained from the patient following an explanation of the procedure, risks, benefits and alternatives. The patient understands, agrees and consents for the procedure. All questions were addressed. A time out was performed prior to the initiation of the procedure. The patient was positioned prone and non-contrast localization CT was performed of the pelvis to demonstrate the iliac marrow spaces. The operative site was prepped and draped in the usual sterile fashion. Under sterile conditions and local anesthesia, a 22 gauge spinal needle was utilized for procedural planning. Next, an 11 gauge coaxial bone biopsy needle was advanced into the left iliac marrow space. Needle position was confirmed with CT imaging. Initially, a bone marrow aspiration was performed. Next, a bone marrow biopsy was obtained with the 11 gauge outer bone marrow device. Samples were prepared with the cytotechnologist and deemed adequate. The needle was removed and superficial hemostasis was obtained with manual compression. A dressing was applied. The patient tolerated the procedure well without immediate post procedural complication. IMPRESSION: Successful CT guided left iliac bone marrow aspiration and core biopsy. Electronically Signed   By: Sandi Mariscal M.D.   On: 01/04/2021 09:46   CT BONE MARROW BIOPSY & ASPIRATION  Result Date: 01/04/2021 INDICATION: Concern for multiple myeloma. Please perform CT-guided bone marrow biopsy for tissue diagnostic  purposes. EXAM: CT-GUIDED BONE MARROW BIOPSY AND ASPIRATION MEDICATIONS: None ANESTHESIA/SEDATION: Fentanyl 100 mcg IV; Versed 1 mg IV Sedation Time: 10 Minutes; The patient was continuously monitored during the procedure by the interventional radiology nurse under my direct supervision. COMPLICATIONS: None immediate. PROCEDURE: Informed consent was obtained  from the patient following an explanation of the procedure, risks, benefits and alternatives. The patient understands, agrees and consents for the procedure. All questions were addressed. A time out was performed prior to the initiation of the procedure. The patient was positioned prone and non-contrast localization CT was performed of the pelvis to demonstrate the iliac marrow spaces. The operative site was prepped and draped in the usual sterile fashion. Under sterile conditions and local anesthesia, a 22 gauge spinal needle was utilized for procedural planning. Next, an 11 gauge coaxial bone biopsy needle was advanced into the left iliac marrow space. Needle position was confirmed with CT imaging. Initially, a bone marrow aspiration was performed. Next, a bone marrow biopsy was obtained with the 11 gauge outer bone marrow device. Samples were prepared with the cytotechnologist and deemed adequate. The needle was removed and superficial hemostasis was obtained with manual compression. A dressing was applied. The patient tolerated the procedure well without immediate post procedural complication. IMPRESSION: Successful CT guided left iliac bone marrow aspiration and core biopsy. Electronically Signed   By: Sandi Mariscal M.D.   On: 01/04/2021 09:46    01/04/2021 Molecular Pathology    01/04/2021 Surgical Pathology  BONE MARROW, ASPIRATE, CLOT, CORE:  -  Variably cellular marrow involved by lambda restricted plasma cell  neoplasm   PERIPHERAL BLOOD:  -  Leukopenia and thrombocytopenia      ASSESSMENT & PLAN:   75 yo with   1)  Recurrent/persistent hematuria bleeding after prostate surgery. No fhx or previous personal h/o bleeding issues.  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  -Discussed pt 12/19/2020 light chains; elevated Lambda light chains at 2,517.1.  -Discussed pt CT Bone Marrow Biopsy & Aspiration on 01/04/2021; 90% of plasma cell involvement.  -Discussed pt NM PET Whole Body on 01/09/2021; nonspecific finding on iliac bone. No overt findings of bone disease. -Discussed pt 01/16/2021 UPEP; elevated protein in urine and elevated lambda light chains in urine. 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 -Advised pt that he has active Multiple Myeloma due to the extent of plasma cell involvement on bone marrow biopsy and affecting his blood counts.  -Discussed CRAB criteria: no hypercalcemia, mild renal changes as seen in protein in urine, mild anemia that is worsening lately, and nonspecific finding of bone tumor that needs further evaluation. -Advised pt that this can be controlled and put into remission, but is not considered curable due to risk of recurrence and need for long-term treatment. -Discussed goal of VGPR or complete remission.  -Discussed treatment options: induction treatment followed by maintenance or transplant if candidate. -Advised pt he does not have the highest risk mutations but does have higher risk (1p/1q) that suggests more aggressive course.  -Recommended standard regiment of Carfilzomib, Revlimid, and dexamethasone (KRd). -Advised pt that placing a port would be more convenient. This is not mandatory for treatment he is receiving, but would be recommended. Will hold off on this for now. -Will also start pt on Zometa infusions qmonthly for  supportive treatment of bone strengthening. -Advised pt that Multiple Myeloma is not genetically inherited and is most often caused by known risk factors we discussed. -Recommended pt eat healthy, continue to stay active, and get ample rest. -Advised pt to be cautious of social surroundings  due to increased risk of infections. -Discussed Evusheld and pt's eligibility. Will send referral.  -Emphasized importance of drinking 2L water daily to flush kidneys. -Will start on 2000 IU Vitamin D daily. -Will set up for chemo counseling. -Will see back in 10-12 days with C1D1 KRD.   Orders Placed This Encounter  Procedures   Ambulatory Referral for Evusheld    Referral Priority:   Routine    Referral Type:   Consultation    Referral Reason:   Specialty Services Required    Number of Visits Requested:   1      FOLLOW UP: Chemo-counseling for KRD in 5 days Plz schedule to start [place] in 10-12 days with labs and MD visit May cancel MD visit currently scheduled on 7/8 Referral for Evusheld    All of the patients questions were answered with apparent satisfaction. The patient knows to call the clinic with any problems, questions or concerns.  The total time spent in the appointment was 45 minutes and more than 50% was on counseling and direct patient cares.     Sullivan Lone MD [place] AAHIVMS [facility] Hematology/Oncology Physician [facility]  (Office):       [phone] (Work cell):  [phone] (Fax):           [phone]  01/26/2021 10:02 AM  I, Brian Levine, am acting as scribe for Dr. Sullivan Lone, MD.  .I have reviewed the above documentation for accuracy and completeness, and I agree with the above. Brunetta Genera MD

## 2021-01-27 LAB — MULTIPLE MYELOMA PANEL, SERUM
Albumin SerPl Elph-Mcnc: 3.8 g/dL (ref 2.9–4.4)
Albumin/Glob SerPl: 1.8 — ABNORMAL HIGH (ref 0.7–1.7)
Alpha 1: 0.2 g/dL (ref 0.0–0.4)
Alpha2 Glob SerPl Elph-Mcnc: 0.6 g/dL (ref 0.4–1.0)
B-Globulin SerPl Elph-Mcnc: 0.9 g/dL (ref 0.7–1.3)
Gamma Glob SerPl Elph-Mcnc: 0.5 g/dL (ref 0.4–1.8)
Globulin, Total: 2.2 g/dL (ref 2.2–3.9)
IgA: 110 mg/dL (ref 61–437)
IgG (Immunoglobin G), Serum: 538 mg/dL — ABNORMAL LOW (ref 603–1613)
IgM (Immunoglobulin M), Srm: 17 mg/dL (ref 15–143)
Total Protein ELP: 6 g/dL (ref 6.0–8.5)

## 2021-01-30 ENCOUNTER — Other Ambulatory Visit: Payer: Self-pay | Admitting: Hematology

## 2021-01-30 ENCOUNTER — Telehealth: Payer: Self-pay | Admitting: Adult Health

## 2021-01-30 DIAGNOSIS — C9000 Multiple myeloma not having achieved remission: Secondary | ICD-10-CM | POA: Insufficient documentation

## 2021-01-30 DIAGNOSIS — Z7189 Other specified counseling: Secondary | ICD-10-CM | POA: Insufficient documentation

## 2021-01-30 MED ORDER — ONDANSETRON HCL 8 MG PO TABS: 8.0000 mg | ORAL_TABLET | Freq: Two times a day (BID) | ORAL | 1 refills | Status: DC | PRN

## 2021-01-30 MED ORDER — ACYCLOVIR 400 MG PO TABS: 400.0000 mg | ORAL_TABLET | Freq: Every day | ORAL | 3 refills | Status: DC

## 2021-01-30 MED ORDER — LENALIDOMIDE 15 MG PO CAPS: 15.0000 mg | ORAL_CAPSULE | Freq: Every day | ORAL | 0 refills | Status: DC

## 2021-01-30 MED ORDER — PROCHLORPERAZINE MALEATE 10 MG PO TABS: 10.0000 mg | ORAL_TABLET | Freq: Four times a day (QID) | ORAL | 1 refills | Status: DC | PRN

## 2021-01-30 MED ORDER — LIDOCAINE-PRILOCAINE 2.5-2.5 % EX CREA: TOPICAL_CREAM | CUTANEOUS | 3 refills | Status: DC

## 2021-01-30 MED ORDER — ASPIRIN EC 81 MG PO TBEC: 81.0000 mg | DELAYED_RELEASE_TABLET | Freq: Every day | ORAL | 3 refills | Status: DC

## 2021-01-30 MED ORDER — DEXAMETHASONE 4 MG PO TABS: ORAL_TABLET | ORAL | 4 refills | Status: DC

## 2021-01-30 NOTE — Telephone Encounter (Signed)
I spoke with Brian Levine in detail about the referral received for Brian Levine for COVID 19 prevention.  We discussed the risks and benefits. He would like to think about this further and will get back to me.  My chart message sent with information and my call back number if he chooses to proceed with injection.   Wilber Bihari, NP

## 2021-01-30 NOTE — Progress Notes (Signed)
START ON PATHWAY REGIMEN - Multiple Myeloma and Other Plasma Cell Dyscrasias     A cycle is every 28 days:     Carfilzomib      Carfilzomib      Carfilzomib      Dexamethasone      Dexamethasone      Lenalidomide   **Always confirm dose/schedule in your pharmacy ordering system**  Patient Characteristics: Multiple Myeloma, Newly Diagnosed, Transplant Eligible, High Risk Disease Classification: Multiple Myeloma R-ISS Staging: Unknown Therapeutic Status: Newly Diagnosed Is Patient Eligible for Transplant<= Transplant Eligible Risk Status: High Risk Intent of Therapy: Non-Curative / Palliative Intent, Discussed with Patient

## 2021-01-31 ENCOUNTER — Telehealth: Payer: Self-pay

## 2021-01-31 ENCOUNTER — Telehealth: Payer: Self-pay | Admitting: Hematology

## 2021-01-31 ENCOUNTER — Telehealth: Payer: Self-pay | Admitting: Pharmacist

## 2021-01-31 ENCOUNTER — Other Ambulatory Visit (HOSPITAL_COMMUNITY): Payer: Self-pay

## 2021-01-31 NOTE — Telephone Encounter (Signed)
Oral Oncology Patient Advocate Encounter   Received notification from [facility] that prior authorization for Revlimid is required.   PA submitted on CoverMyMeds Key [id number] Status is pending   Oral Oncology Clinic will continue to follow.   [place] Patient [place] Phone [phone] Fax [phone] 01/31/2021 9:32 AM

## 2021-01-31 NOTE — Progress Notes (Signed)
Pharmacist Chemotherapy Monitoring - Initial Assessment    Anticipated start date: 02/06/21   The following has been reviewed per standard work regarding the patient's treatment regimen: The patient's diagnosis, treatment plan and drug doses, and organ/hematologic function Lab orders and baseline tests specific to treatment regimen  The treatment plan start date, drug sequencing, and pre-medications Prior authorization status  Patient's documented medication list, including drug-drug interaction screen and prescriptions for anti-emetics and supportive care specific to the treatment regimen The drug concentrations, fluid compatibility, administration routes, and timing of the medications to be used The patient's access for treatment and lifetime cumulative dose history, if applicable  The patient's medication allergies and previous infusion related reactions, if applicable   Changes made to treatment plan:  N/A  Follow up needed:  Pending authorization for treatment    Philomena Course, [place], 01/31/2021  10:44 AM

## 2021-01-31 NOTE — Telephone Encounter (Signed)
Scheduled follow-up appointments per 7/1 los. Patient is aware.

## 2021-01-31 NOTE — Telephone Encounter (Signed)
Oral Oncology Patient Advocate Encounter  Prior Authorization for Revlimid has been approved.    PA# [id number] Effective dates: 01/31/21 through 07/30/21  Patient must fill at [facility] ([facility])  [facility] will continue to follow.   [place] Patient [place] Phone [phone] Fax [phone] 01/31/2021 9:37 AM

## 2021-01-31 NOTE — Telephone Encounter (Signed)
Oral Oncology Pharmacist Encounter  Received new prescription for Revlimid (lenalidomide) for the treatment of newly diagnosed multiple myeloma in conjunction with carfilzomib and dexamethasone, planned for duration of induction treatment.  Prescription dose and frequency assessed for appropriateness.  CBC w/ Diff and CMP from 01/24/21 assessed, noted Scr 1.24 mg/dL (CrCl ~61 mL/min), pt pltc of 111 K/uL.   Current medication list in Epic reviewed, no relevant/significant DDIs with Revlimid identified.  Evaluated chart and no patient barriers to medication adherence noted.   Patient will need [employer] auth # obtained and then prescription will need to be redirected to [facility] for dispensing.   Oral Oncology Clinic will continue to follow for insurance authorization, copayment issues, initial counseling and start date.  Leron Croak, PharmD, BCPS Hematology/Oncology Clinical Pharmacist [facility] [phone] 01/31/2021 8:51 AM

## 2021-02-01 ENCOUNTER — Encounter: Payer: Self-pay | Admitting: Hematology

## 2021-02-01 ENCOUNTER — Inpatient Hospital Stay: Payer: Medicare HMO

## 2021-02-01 ENCOUNTER — Other Ambulatory Visit: Payer: Self-pay

## 2021-02-01 DIAGNOSIS — Z7189 Other specified counseling: Secondary | ICD-10-CM

## 2021-02-01 DIAGNOSIS — C9000 Multiple myeloma not having achieved remission: Secondary | ICD-10-CM

## 2021-02-01 LAB — SURGICAL PATHOLOGY

## 2021-02-01 MED ORDER — LENALIDOMIDE 15 MG PO CAPS: 15.0000 mg | ORAL_CAPSULE | Freq: Every day | ORAL | 0 refills | Status: DC

## 2021-02-02 ENCOUNTER — Other Ambulatory Visit: Payer: Medicare HMO

## 2021-02-02 ENCOUNTER — Ambulatory Visit: Payer: Medicare HMO | Admitting: Hematology

## 2021-02-05 NOTE — Telephone Encounter (Signed)
Oral Chemotherapy Pharmacist Encounter   Called and spoke with patient this AM to follow up if Revlimid had been delivered from [facility]. Patient stated he has not received medication yet from pharmacy.   Follow up call made to [facility] regarding status of Revlimid (lenalidomide) prescription for patient. Informed by representative that it is ready to be set up for shipment for patient and nurse educator will be reaching out to patient to set up shipment. Request made for this to be expedited to be shipped out to patient today since he is starting therapy tomorrow.   Leron Croak, PharmD, BCPS Hematology/Oncology Clinical Pharmacist [facility] [phone] 02/05/2021 10:20 AM

## 2021-02-05 NOTE — Progress Notes (Signed)
HEMATOLOGY/ONCOLOGY CONSULTATION NOTE  Date of Service: 02/06/2021  Patient Care Team: Lavone Orn, MD as PCP - General (Internal Medicine)  CHIEF COMPLAINTS/PURPOSE OF CONSULTATION:  Prostate Bleeding Newly diagnosed multiple myeloma  HISTORY OF PRESENTING ILLNESS:   Brian Levine is a wonderful 75 y.o. male who has been referred to [place] by [facility] for evaluation and management of bleeding from the prostate following a turp procedure. The pt reports that he is doing well overall.  The pt reports that he has had continued prostate bleeding following a TUR procedure on March 29. He had 2-3 weeks of continued bleeding before any procedure where they cauterized leaking blood vessels multiple times now on April 13 and May 6. He was given ddAVP once to try and stop the bleeding. The pt is still bleeding even after this. The pt denies any history of excessive bleeding after surgery or any familial history of bleeding issues. He has never experienced bleeding like this in his life prior or with any other surgeries he has had. He has never experienced any abnormal bruising nor has ever been on any blood thinners. He took a Baby ASA for a short period of time, but not except many years ago. The pt has no unusual food preferences nor was there nay concern for liver issues or problems with his liver. Outside of the surgery, the pt denies any bleeding issues or spontaneous bruising.   The pt notes he had a UroLift five years ago prior to getting the turp procedure recently. He notes he currently has visible urine in his blood that starts as a bright red color and is highly visible. This dilutes out as more urine comes out. He notes that he has now had to go to the restroom 3-4 times each night, uncommon and not his baseline activity. The pt notes that he had a catheter for 3-4 days twice, but this caused no changes and the blood was just in the bag. The first cauterization lasted 5 days without  bleeding and the second lasted 7 days. The pt does not currently see any blood clots after seeing them until the second cauterization./  Lab results 12/15/2020 of CBC w/diff is as follows: all values are WNL except for WBC of 4.2K, RBC of 4.11, Hgb of 13.0, HCT of 38.1, Plt of 146K. 12/15/2020 PTT of 27.3.  On review of systems, pt reports initial and terminal hematuria, frequent urination during the night and denies infection issues, leg swelling, abdominal pain, back pain, and any other symptoms.  INTERVAL HISTORY:  Brian Levine is here for f/u regarding evaluation and management of continued bleeding from the prostate after surgery. The patient's last visit with [place] was on 01/26/2021. The pt reports that he is doing well overall. He is here for C1D1 KRd.  The pt reports no new concerns or symptoms. He has not had any further bleeding issues. He has decided he does not wish to get the Evusheld. He has received only his first booster at this time.  Lab results today 02/06/2021 of CBC w/diff and CMP is as follows: all values are WNL except for WBC of 2.9K, HCT of 37.8, Plt of 110K, Neutro Abs of 1.5K. 02/06/2021 MMP in progress. 02/06/2021 Light Chains in progress.  On review of systems, pt denies bleeding issues, fevers, chills, leg swelling,  and any other symptoms.   MEDICAL HISTORY:  Past Medical History:  Diagnosis Date   BPH (benign prostatic hyperplasia) 2004  Constipation    Other pancytopenia ([place]) 05/04/2013   Thrombocytopenia, unspecified ([place]) 08/17/2013   Weight loss 05/04/2013    SURGICAL HISTORY: Past Surgical History:  Procedure Laterality Date   APPENDECTOMY     COLONOSCOPY WITH PROPOFOL N/A 07/17/2015   Procedure: COLONOSCOPY WITH PROPOFOL;  Surgeon: Garlan Fair, MD;  Location: [facility];  Service: Endoscopy;  Laterality: N/A;   CYSTOSCOPY WITH FULGERATION N/A 11/08/2020   Procedure: CYSTOSCOPY WITH FULGERATION OF BLEEDERS;  Surgeon: Irine Seal, MD;   Location: [facility];  Service: Urology;  Laterality: N/A;   CYSTOSCOPY WITH FULGERATION N/A 12/01/2020   Procedure: CYSTOSCOPY WITH FULGERATION OF  PROSTATE;  Surgeon: Irine Seal, MD;  Location: [facility];  Service: Urology;  Laterality: N/A;   CYSTOSCOPY WITH INSERTION OF UROLIFT N/A 08/22/2015   Procedure: CYSTOSCOPY WITH INSERTION OF UROLIFT;  Surgeon: Irine Seal, MD;  Location: [facility];  Service: Urology;  Laterality: N/A;   CYSTOSCOPY WITH LITHOLAPAXY N/A 10/24/2020   Procedure: CYSTOSCOPY WITH LITHOLAPAXY;  Surgeon: Irine Seal, MD;  Location: [facility];  Service: Urology;  Laterality: N/A;  REQUESTING 1 HR FOR CASE   EYE SURGERY     right cataract surgery with lens implant   TESTICLE SURGERY Right 2011   hydrocele   TRANSURETHRAL RESECTION OF PROSTATE N/A 10/24/2020   Procedure: TRANSURETHRAL RESECTION OF THE PROSTATE (TURP);  Surgeon: Irine Seal, MD;  Location: [facility];  Service: Urology;  Laterality: N/A;    SOCIAL HISTORY: Social History   Socioeconomic History   Marital status: Married    Spouse name: Not on file   Number of children: Not on file   Years of education: Not on file   Highest education level: Not on file  Occupational History   Not on file  Tobacco Use   Smoking status: Former    Packs/day: 1.50    Years: 30.00    Pack years: 45.00    Types: Cigarettes    Quit date: 07/30/1995    Years since quitting: 25.5   Smokeless tobacco: Never  Vaping Use   Vaping Use: Never used  Substance and Sexual Activity   Alcohol use: No   Drug use: No   Sexual activity: Not on file  Other Topics Concern   Not on file  Social History Narrative   Not on file   Social Determinants of Health   Financial Resource Strain: Not on file  Food Insecurity: Not on file  Transportation Needs: Not on file  Physical Activity: Not on file  Stress: Not on file  Social Connections: Not on file  Intimate Partner Violence: Not on file    FAMILY HISTORY: No family history on  file.  ALLERGIES:  has No Known Allergies.  MEDICATIONS:  Current Outpatient Medications  Medication Sig Dispense Refill   acetaminophen (TYLENOL) 325 MG tablet Take 650 mg by mouth every 6 (six) hours as needed (for pain.).     acyclovir (ZOVIRAX) 400 MG tablet Take 1 tablet (400 mg total) by mouth daily. 30 tablet 3   aspirin EC 81 MG tablet Take 1 tablet (81 mg total) by mouth daily. 100 tablet 3   dexamethasone (DECADRON) 4 MG tablet Take 10 tablets (51m) once on day 22. Repeat every 28 days for the first 4 cycles. Take with breakfast. 40 tablet 4   doxazosin (CARDURA) 8 MG tablet Take 8 mg by mouth daily in the afternoon.     finasteride (PROSCAR) 5 MG tablet Take 5 mg by mouth daily  in the afternoon.     lenalidomide (REVLIMID) 15 MG capsule Take 1 capsule (15 mg total) by mouth daily. Take for 21 days. Hold 7 days. Repeat every 28 days. 21 capsule 0   lidocaine-prilocaine (EMLA) cream Apply to affected area once 30 g 3   ondansetron (ZOFRAN) 8 MG tablet Take 1 tablet (8 mg total) by mouth 2 (two) times daily as needed (Nausea or vomiting). 30 tablet 1   pravastatin (PRAVACHOL) 40 MG tablet Take 40 mg by mouth daily before supper.     prochlorperazine (COMPAZINE) 10 MG tablet Take 1 tablet (10 mg total) by mouth every 6 (six) hours as needed (Nausea or vomiting). 30 tablet 1   No current facility-administered medications for this visit.   Facility-Administered Medications Ordered in Other Visits  Medication Dose Route Frequency Provider Last Rate Last Admin   0.9 %  sodium chloride infusion   Intravenous Once Brunetta Genera, MD       carfilzomib (KYPROLIS) 40 mg in dextrose 5 % 50 mL chemo infusion  20 mg/m2 (Treatment Plan Recorded) Intravenous Once Brunetta Genera, MD       heparin lock flush 100 unit/mL  500 Units Intracatheter Once PRN Brunetta Genera, MD       sodium chloride flush (NS) 0.9 % injection 10 mL  10 mL Intracatheter PRN Brunetta Genera, MD         REVIEW OF SYSTEMS:    10 Point review of Systems was done is negative except as noted above.  PHYSICAL EXAMINATION: ECOG PERFORMANCE STATUS: 1 - Symptomatic but completely ambulatory  . There were no vitals filed for this visit. There were no vitals filed for this visit. .There is no height or weight on file to calculate BMI.  Exam was given in infusion.   GENERAL:alert, in no acute distress and comfortable SKIN: no acute rashes, no significant lesions EYES: conjunctiva are pink and non-injected, sclera anicteric OROPHARYNX: MMM, no exudates, no oropharyngeal erythema or ulceration NECK: supple, no JVD LYMPH:  no palpable lymphadenopathy in the cervical, axillary or inguinal regions LUNGS: clear to auscultation b/l with normal respiratory effort HEART: regular rate & rhythm ABDOMEN:  normoactive bowel sounds , non tender, not distended. Extremity: no pedal edema PSYCH: alert & oriented x 3 with fluent speech NEURO: no focal motor/sensory deficits   LABORATORY DATA:  I have reviewed the data as listed  . CBC Latest Ref Rng & Units 02/06/2021 01/24/2021 01/04/2021  WBC 4.0 - 10.5 K/uL 2.9(L) 2.9(L) 3.1(L)  Hemoglobin 13.0 - 17.0 g/dL 13.3 12.1(L) 13.8  Hematocrit 39.0 - 52.0 % 37.8(L) 34.8(L) 39.3  Platelets 150 - 400 K/uL 110(L) 111(L) 135(L)    . CMP Latest Ref Rng & Units 02/06/2021 01/24/2021 12/01/2020  Glucose 70 - 99 mg/dL 86 105(H) 110(H)  BUN 8 - 23 mg/dL '14 10 12  ' Creatinine 0.61 - 1.24 mg/dL 1.24 1.24 1.00  Sodium 135 - 145 mmol/L 143 141 138  Potassium 3.5 - 5.1 mmol/L 4.3 3.9 3.9  Chloride 98 - 111 mmol/L 106 104 106  CO2 22 - 32 mmol/L '27 27 24  ' Calcium 8.9 - 10.3 mg/dL 9.7 8.9 9.4  Total Protein 6.5 - 8.1 g/dL 6.7 6.1(L) -  Total Bilirubin 0.3 - 1.2 mg/dL 0.9 0.8 -  Alkaline Phos 38 - 126 U/L 58 56 -  AST 15 - 41 U/L 19 17 -  ALT 0 - 44 U/L 15 12 -     RADIOGRAPHIC STUDIES:  I have personally reviewed the radiological images as listed and agreed  with the findings in the report. NM PET Image Initial (PI) Whole Body  Result Date: 01/09/2021 CLINICAL DATA:  Initial treatment strategy for multiple myeloma. EXAM: NUCLEAR MEDICINE PET WHOLE BODY TECHNIQUE: 9.1 mCi F-18 FDG was injected intravenously. Full-ring PET imaging was performed from the head to foot after the radiotracer. CT data was obtained and used for attenuation correction and anatomic localization. Fasting blood glucose: 104 mg/dl COMPARISON:  05/12/2013 CT chest, abdomen and pelvis. FINDINGS: Mediastinal blood pool activity: SUV max 2.6 HEAD/NECK: No hypermetabolic activity in the scalp. No hypermetabolic cervical lymph nodes. Incidental CT findings: none CHEST: No enlarged or hypermetabolic axillary, mediastinal or hilar lymph nodes. No hypermetabolic pulmonary findings. Incidental CT findings: Coronary atherosclerosis. Mildly atherosclerotic nonaneurysmal thoracic aorta. Moderate centrilobular and paraseptal emphysema with diffuse bronchial wall thickening. A few scattered small pulmonary nodules measuring up to 4 mm in the medial right upper lobe (series 9/image 22), below PET resolution, all stable since 05/12/2013 chest CT and considered benign. ABDOMEN/PELVIS: No abnormal hypermetabolic activity within the liver, pancreas, adrenal glands, or spleen. No hypermetabolic lymph nodes in the abdomen or pelvis. Incidental CT findings: Moderate sigmoid diverticulosis. Atherosclerotic nonaneurysmal abdominal aorta. Moderate prostatomegaly with post-TURP changes. Apparent small to moderate right hydrocele versus right epididymal spermatoceles. SKELETON: Focus of indistinct mild hypermetabolism in the medial left iliac bone with max SUV 3.7 without associated discrete osseous lesion on the CT images. Otherwise no foci of skeletal hypermetabolism. Incidental CT findings: No suspicious focal osseous lesions. EXTREMITIES: No abnormal hypermetabolic activity in the lower extremities. Incidental CT  findings: none IMPRESSION: 1. Indistinct mild focus of hypermetabolism in the medial left iliac bone without associated discrete osseous lesion on the CT images. Metabolically active myeloma cannot be excluded in this location. 2. Otherwise no additional potential sites of hypermetabolic skeletal or extraosseous multiple myeloma. 3. Chronic findings include: Aortic Atherosclerosis (ICD10-I70.0) and Emphysema (ICD10-J43.9). Coronary atherosclerosis. Moderate prostatomegaly. Moderate sigmoid diverticulosis. Electronically Signed   By: Ilona Sorrel M.D.   On: 01/09/2021 13:07    01/04/2021 Molecular Pathology    01/04/2021 Surgical Pathology  BONE MARROW, ASPIRATE, CLOT, CORE:  -  Variably cellular marrow involved by lambda restricted plasma cell  neoplasm   PERIPHERAL BLOOD:  -  Leukopenia and thrombocytopenia      ASSESSMENT & PLAN:   75 yo with   1) Recurrent/persistent hematuria bleeding after prostate surgery. No fhx or previous personal h/o bleeding issues.  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 Myeloma labs sent out today. -Advised pt to take 1 Tylenol on all treatment days once getting home to avoid potential delayed treatment fevers. -Recommended pt receive the second COVID booster shot as recently approved. Advised pt to wait 4-6 months following first booster shot before getting this. -Recommended again pot receive the Evusheld. -Advised pt it would be acceptable to go the gym, but be very cautious of the social surroundings and amount of people. Take extra precautions. -Discussed potential delay if pt gets COVID infection. -Advised pt that with myeloma, the effectiveness of the vaccine decreases by around 2/3. -Advised pt we would plan to complete between four to six cycles. Each cycle is 28 days. -Will start on lower dose  of Revlimid for C1. -Recommended pt eat healthy, continue to stay active, and get ample rest. -Advised pt  to be cautious of social surroundings due to increased risk of infections. -Emphasized importance of drinking 2L water daily to flush kidneys. -Will start on 2000 IU Vitamin D daily. -Will see back in 1 week with labs for toxicity check.   No orders of the defined types were placed in this encounter.   FOLLOW UP: Start Zometa q4 weeks starting in 1 week x 6 Plz schedule remaining C1 of Carfilzomib with labs on D1,D8 and D15 MD visit C1D8 for toxicity check Plz schedule C2 of Carfilzomib Labs on D1,8,15 of cycle MD visit with C2D1    All of the patients questions were answered with apparent satisfaction. The patient knows to call the clinic with any problems, questions or concerns.  The total time spent in the appointment was 30 minutes and more than 50% was on counseling and direct patient cares.     Sullivan Lone MD [place] AAHIVMS [facility] Hematology/Oncology Physician [facility]  (Office):       [phone] (Work cell):  [phone] (Fax):           [phone]  02/06/2021 11:06 AM  I, Reinaldo Raddle, am acting as scribe for Dr. Sullivan Lone, MD.   .I have reviewed the above documentation for accuracy and completeness, and I agree with the above. Brunetta Genera MD

## 2021-02-05 NOTE — Telephone Encounter (Signed)
Oral Chemotherapy Pharmacist Encounter  I spoke with patient for overview of: Revlimid for the treatment of multiple myeloma in conjunction with carfilzomib and dexamethasone, planned for duration of induction treatment.   Counseled patient on administration, dosing, side effects, monitoring, drug-food interactions, safe handling, storage, and disposal.  Patient will take Revlimid 53m capsules, 1 capsule by mouth once daily, without regard to food, with a full glass of water.  Revlimid will be given 21 days on, 7 days off, repeat every 28 days.  Patient will take dexamethasone 469mtablets, 10 tablets (4040mby mouth once weekly with breakfast.  Revlimid start date: 02/06/21  Adverse effects of Revlimid include but are not limited to: nausea, constipation, diarrhea, abdominal pain, rash, fatigue, drug fever, and decreased blood counts.    Reviewed with patient importance of keeping a medication schedule and plan for any missed doses. No barriers to medication adherence identified.  Medication reconciliation performed and medication/allergy list updated.  Patient has picked up acyclovir and dexamethasone prescriptions. Patient counseled on importance of daily aspirin 72m72mr VTE prophylaxis.  Insurance authorization for Revlimid has been obtained.  Revlimid prescription is being dispensed from [facility] is a limited distribution medication. This will be delivered to patient's home on 02/06/21.  All questions answered.  Mr. OdomMcphillipsced understanding and appreciation.   Medication education handout placed in mail for patient. Patient knows to call the office with questions or concerns. Oral Chemotherapy Clinic phone number provided to patient.   Brian Levine, BCPS Hematology/Oncology Clinical Pharmacist [phone]/2022 4:38 PM

## 2021-02-06 ENCOUNTER — Other Ambulatory Visit: Payer: Self-pay

## 2021-02-06 ENCOUNTER — Inpatient Hospital Stay: Payer: Medicare HMO | Admitting: Hematology

## 2021-02-06 ENCOUNTER — Inpatient Hospital Stay: Payer: Medicare HMO

## 2021-02-06 VITALS — BP 149/67 | HR 64 | Temp 97.9°F | Resp 17 | Wt 184.2 lb

## 2021-02-06 DIAGNOSIS — C9000 Multiple myeloma not having achieved remission: Secondary | ICD-10-CM

## 2021-02-06 DIAGNOSIS — Z87891 Personal history of nicotine dependence: Secondary | ICD-10-CM | POA: Diagnosis not present

## 2021-02-06 DIAGNOSIS — Z7189 Other specified counseling: Secondary | ICD-10-CM

## 2021-02-06 DIAGNOSIS — Z298 Encounter for other specified prophylactic measures: Secondary | ICD-10-CM | POA: Diagnosis not present

## 2021-02-06 DIAGNOSIS — D72819 Decreased white blood cell count, unspecified: Secondary | ICD-10-CM | POA: Diagnosis not present

## 2021-02-06 DIAGNOSIS — Z5112 Encounter for antineoplastic immunotherapy: Secondary | ICD-10-CM | POA: Diagnosis not present

## 2021-02-06 DIAGNOSIS — Z79899 Other long term (current) drug therapy: Secondary | ICD-10-CM | POA: Diagnosis not present

## 2021-02-06 DIAGNOSIS — Z5111 Encounter for antineoplastic chemotherapy: Secondary | ICD-10-CM

## 2021-02-06 DIAGNOSIS — N421 Congestion and hemorrhage of prostate: Secondary | ICD-10-CM | POA: Diagnosis not present

## 2021-02-06 DIAGNOSIS — D696 Thrombocytopenia, unspecified: Secondary | ICD-10-CM | POA: Diagnosis not present

## 2021-02-06 LAB — CBC WITH DIFFERENTIAL (CANCER CENTER ONLY)
Abs Immature Granulocytes: 0.01 10*3/uL (ref 0.00–0.07)
Basophils Absolute: 0 10*3/uL (ref 0.0–0.1)
Basophils Relative: 0 %
Eosinophils Absolute: 0.1 10*3/uL (ref 0.0–0.5)
Eosinophils Relative: 2 %
HCT: 37.8 % — ABNORMAL LOW (ref 39.0–52.0)
Hemoglobin: 13.3 g/dL (ref 13.0–17.0)
Immature Granulocytes: 0 %
Lymphocytes Relative: 34 %
Lymphs Abs: 1 10*3/uL (ref 0.7–4.0)
MCH: 30.9 pg (ref 26.0–34.0)
MCHC: 35.2 g/dL (ref 30.0–36.0)
MCV: 87.7 fL (ref 80.0–100.0)
Monocytes Absolute: 0.3 10*3/uL (ref 0.1–1.0)
Monocytes Relative: 12 %
Neutro Abs: 1.5 10*3/uL — ABNORMAL LOW (ref 1.7–7.7)
Neutrophils Relative %: 52 %
Platelet Count: 110 10*3/uL — ABNORMAL LOW (ref 150–400)
RBC: 4.31 MIL/uL (ref 4.22–5.81)
RDW: 12.5 % (ref 11.5–15.5)
WBC Count: 2.9 10*3/uL — ABNORMAL LOW (ref 4.0–10.5)
nRBC: 0 % (ref 0.0–0.2)

## 2021-02-06 LAB — CMP (CANCER CENTER ONLY)
ALT: 15 U/L (ref 0–44)
AST: 19 U/L (ref 15–41)
Albumin: 4 g/dL (ref 3.5–5.0)
Alkaline Phosphatase: 58 U/L (ref 38–126)
Anion gap: 10 (ref 5–15)
BUN: 14 mg/dL (ref 8–23)
CO2: 27 mmol/L (ref 22–32)
Calcium: 9.7 mg/dL (ref 8.9–10.3)
Chloride: 106 mmol/L (ref 98–111)
Creatinine: 1.24 mg/dL (ref 0.61–1.24)
GFR, Estimated: >60 mL/min (ref >60)
Glucose, Bld: 86 mg/dL (ref 70–99)
Potassium: 4.3 mmol/L (ref 3.5–5.1)
Sodium: 143 mmol/L (ref 135–145)
Total Bilirubin: 0.9 mg/dL (ref 0.3–1.2)
Total Protein: 6.7 g/dL (ref 6.5–8.1)

## 2021-02-06 MED ORDER — DEXTROSE 5 % IV SOLN
20.0000 mg/m2 | Freq: Once | INTRAVENOUS | Status: AC
  Administered 2021-02-06: 40 mg via INTRAVENOUS
  Filled 2021-02-06: qty 5

## 2021-02-06 MED ORDER — HEPARIN SOD (PORK) LOCK FLUSH 100 UNIT/ML IV SOLN
500.0000 [IU] | Freq: Once | INTRAVENOUS | Status: DC | PRN
  Filled 2021-02-06: qty 5

## 2021-02-06 MED ORDER — SODIUM CHLORIDE 0.9 % IV SOLN
Freq: Once | INTRAVENOUS | Status: AC
  Filled 2021-02-06: qty 250

## 2021-02-06 MED ORDER — SODIUM CHLORIDE 0.9 % IV SOLN
Freq: Once | INTRAVENOUS | Status: DC
  Filled 2021-02-06: qty 250

## 2021-02-06 MED ORDER — LENALIDOMIDE 15 MG PO CAPS: 15.0000 mg | ORAL_CAPSULE | Freq: Every day | ORAL | 0 refills | Status: DC

## 2021-02-06 MED ORDER — SODIUM CHLORIDE 0.9% FLUSH
10.0000 mL | INTRAVENOUS | Status: DC | PRN
  Filled 2021-02-06: qty 10

## 2021-02-06 MED ORDER — SODIUM CHLORIDE 0.9 % IV SOLN
20.0000 mg | Freq: Once | INTRAVENOUS | Status: AC
  Administered 2021-02-06: 20 mg via INTRAVENOUS
  Filled 2021-02-06: qty 20

## 2021-02-06 NOTE — Patient Instructions (Signed)
[facility]  Discharge Instructions: Thank you for choosing [facility] to provide your oncology and hematology care.   If you have a lab appointment with the Cancer Center, please go directly to the Cancer Center and check in at the registration area.   Wear comfortable clothing and clothing appropriate for easy access to any Portacath or PICC line.   We strive to give you quality time with your provider. You may need to reschedule your appointment if you arrive late (15 or more minutes).  Arriving late affects you and other patients whose appointments are after yours.  Also, if you miss three or more appointments without notifying the office, you may be dismissed from the clinic at the provider's discretion.      For prescription refill requests, have your pharmacy contact our office and allow 72 hours for refills to be completed.    Today you received the following chemotherapy and/or immunotherapy agents Kyprolis      To help prevent nausea and vomiting after your treatment, we encourage you to take your nausea medication as directed.  BELOW ARE SYMPTOMS THAT SHOULD BE REPORTED IMMEDIATELY: *FEVER GREATER THAN 100.4 F (38 C) OR HIGHER *CHILLS OR SWEATING *NAUSEA AND VOMITING THAT IS NOT CONTROLLED WITH YOUR NAUSEA MEDICATION *UNUSUAL SHORTNESS OF BREATH *UNUSUAL BRUISING OR BLEEDING *URINARY PROBLEMS (pain or burning when urinating, or frequent urination) *BOWEL PROBLEMS (unusual diarrhea, constipation, pain near the anus) TENDERNESS IN MOUTH AND THROAT WITH OR WITHOUT PRESENCE OF ULCERS (sore throat, sores in mouth, or a toothache) UNUSUAL RASH, SWELLING OR PAIN  UNUSUAL VAGINAL DISCHARGE OR ITCHING   Items with * indicate a potential emergency and should be followed up as soon as possible or go to the Emergency Department if any problems should occur.  Please show the CHEMOTHERAPY ALERT CARD or IMMUNOTHERAPY ALERT CARD at check-in to  the Emergency Department and triage nurse.  Should you have questions after your visit or need to cancel or reschedule your appointment, please contact [facility]  Dept: [phone]  and follow the prompts.  Office hours are 8:00 a.m. to 4:30 p.m. Monday - Friday. Please note that voicemails left after 4:00 p.m. may not be returned until the following business day.  We are closed weekends and major holidays. You have access to a nurse at all times for urgent questions. Please call the main number to the clinic Dept: [phone] and follow the prompts.   For any non-urgent questions, you may also contact your provider using MyChart. We now offer e-Visits for anyone 18 and older to request care online for non-urgent symptoms. For details visit [email or web address].   Also download the MyChart app! Go to the app store, search "MyChart", open the app, select [facility], and log in with your MyChart username and password.  Due to Covid, a mask is required upon entering the hospital/clinic. If you do not have a mask, one will be given to you upon arrival. For doctor visits, patients may have 1 support person aged 18 or older with them. For treatment visits, patients cannot have anyone with them due to current Covid guidelines and our immunocompromised population.   Carfilzomib injection What is this medication? CARFILZOMIB (kar FILZ oh mib) targets a specific protein within cancer cellsand stops the cancer cells from growing. It is used to treat multiple myeloma. This medicine may be used for other purposes; ask your health care provider orpharmacist if you have questions.   COMMON BRAND NAME(S): KYPROLIS What should I tell my care team before I take this medication? They need to know if you have any of these conditions: heart disease history of blood clots irregular heartbeat kidney disease liver disease lung or breathing disease an unusual or allergic reaction  to carfilzomib, or other medicines, foods, dyes, or preservatives pregnant or trying to get pregnant breast-feeding How should I use this medication? This medicine is for injection or infusion into a vein. It is given by a healthcare professional in a hospital or clinic setting. Talk to your pediatrician regarding the use of this medicine in children.Special care may be needed. Overdosage: If you think you have taken too much of this medicine contact apoison control center or emergency room at once. NOTE: This medicine is only for you. Do not share this medicine with others. What if I miss a dose? It is important not to miss your dose. Call your doctor or health careprofessional if you are unable to keep an appointment. What may interact with this medication? Interactions are not expected. This list may not describe all possible interactions. Give your health care provider a list of all the medicines, herbs, non-prescription drugs, or dietary supplements you use. Also tell them if you smoke, drink alcohol, or use illegaldrugs. Some items may interact with your medicine. What should I watch for while using this medication? Your condition will be monitored while you are receiving this medicine. You may need blood work done while you are taking this medicine. Do not become pregnant while taking this medicine or for 6 months after stopping it. Women should inform their health care provider if they wish to become pregnant or think they might be pregnant. Men should not father a child while taking this medicine and for 3 months after stopping it. There is a potential for serious side effects to an unborn child. Talk to your health care provider for more information. Do not breast-feed an infant while taking thismedicine or for 2 weeks after stopping it. Check with your health care provider if you have severe diarrhea, nausea, and vomiting, or if you sweat a lot. The loss of too much body fluid may make  itdangerous for you to take this medicine. You may get drowsy or dizzy. Do not drive, use machinery, or do anything that needs mental alertness until you know how this medicine affects you. Do not stand up or sit up quickly, especially if you are an older patient. Thisreduces the risk of dizzy or fainting spells. What side effects may I notice from receiving this medication? Side effects that you should report to your doctor or health care professionalas soon as possible: allergic reactions like skin rash, itching or hives, swelling of the face, lips, or tongue confusion dizziness feeling faint or lightheaded fever or chills palpitations seizures signs and symptoms of bleeding such as bloody or black, tarry stools; red or dark-brown urine; spitting up blood or brown material that looks like coffee grounds; red spots on the skin; unusual bruising or bleeding including from the eye, gums, or nose signs and symptoms of a blood clot such as breathing problems; changes in vision; chest pain; severe, sudden headache; pain, swelling, warmth in the leg; trouble speaking; sudden numbness or weakness of the face, arm or leg signs and symptoms of kidney injury like trouble passing urine or change in the amount of urine signs and symptoms of liver injury like dark yellow or brown urine; general ill feeling or flu-like symptoms;   light-colored stools; loss of appetite; nausea; right upper belly pain; unusually weak or tired; yellowing of the eyes or skin Side effects that usually do not require medical attention (report to yourdoctor or health care professional if they continue or are bothersome): back pain cough diarrhea headache muscle cramps trouble sleeping vomiting This list may not describe all possible side effects. Call your doctor for medical advice about side effects. You may report side effects to FDA at1-800-FDA-1088. Where should I keep my medication? This drug is given in a hospital or  clinic and will not be stored at home. NOTE: This sheet is a summary. It may not cover all possible information. If you have questions about this medicine, talk to your doctor, pharmacist, orhealth care provider.  2022 Elsevier/Gold Standard (2020-08-04 15:24:55)   

## 2021-02-07 ENCOUNTER — Telehealth: Payer: Self-pay | Admitting: Hematology

## 2021-02-07 ENCOUNTER — Inpatient Hospital Stay: Payer: Medicare HMO

## 2021-02-07 VITALS — BP 145/64 | HR 67 | Temp 98.4°F | Resp 16

## 2021-02-07 DIAGNOSIS — C9000 Multiple myeloma not having achieved remission: Secondary | ICD-10-CM | POA: Diagnosis not present

## 2021-02-07 DIAGNOSIS — Z87891 Personal history of nicotine dependence: Secondary | ICD-10-CM | POA: Diagnosis not present

## 2021-02-07 DIAGNOSIS — D696 Thrombocytopenia, unspecified: Secondary | ICD-10-CM | POA: Diagnosis not present

## 2021-02-07 DIAGNOSIS — Z7189 Other specified counseling: Secondary | ICD-10-CM

## 2021-02-07 DIAGNOSIS — N421 Congestion and hemorrhage of prostate: Secondary | ICD-10-CM | POA: Diagnosis not present

## 2021-02-07 DIAGNOSIS — Z298 Encounter for other specified prophylactic measures: Secondary | ICD-10-CM | POA: Diagnosis not present

## 2021-02-07 DIAGNOSIS — Z79899 Other long term (current) drug therapy: Secondary | ICD-10-CM | POA: Diagnosis not present

## 2021-02-07 DIAGNOSIS — D72819 Decreased white blood cell count, unspecified: Secondary | ICD-10-CM | POA: Diagnosis not present

## 2021-02-07 DIAGNOSIS — Z5112 Encounter for antineoplastic immunotherapy: Secondary | ICD-10-CM | POA: Diagnosis not present

## 2021-02-07 LAB — KAPPA/LAMBDA LIGHT CHAINS
Kappa free light chain: 17.6 mg/L (ref 3.3–19.4)
Kappa, lambda light chain ratio: 0.01 — ABNORMAL LOW (ref 0.26–1.65)
Lambda free light chains: 3203.4 mg/L — ABNORMAL HIGH (ref 5.7–26.3)

## 2021-02-07 MED ORDER — SODIUM CHLORIDE 0.9 % IV SOLN
Freq: Once | INTRAVENOUS | Status: DC
  Filled 2021-02-07: qty 250

## 2021-02-07 MED ORDER — DEXTROSE 5 % IV SOLN
20.0000 mg/m2 | Freq: Once | INTRAVENOUS | Status: AC
  Administered 2021-02-07: 40 mg via INTRAVENOUS
  Filled 2021-02-07: qty 15

## 2021-02-07 MED ORDER — PROCHLORPERAZINE MALEATE 10 MG PO TABS
10.0000 mg | ORAL_TABLET | Freq: Once | ORAL | Status: AC
  Administered 2021-02-07: 10 mg via ORAL

## 2021-02-07 MED ORDER — SODIUM CHLORIDE 0.9 % IV SOLN
Freq: Once | INTRAVENOUS | Status: AC
Start: 2021-02-07 — End: 2021-02-07
  Filled 2021-02-07: qty 250

## 2021-02-07 MED ORDER — SODIUM CHLORIDE 0.9 % IV SOLN
20.0000 mg | Freq: Once | INTRAVENOUS | Status: AC
  Administered 2021-02-07: 20 mg via INTRAVENOUS
  Filled 2021-02-07: qty 20

## 2021-02-07 MED ORDER — PROCHLORPERAZINE MALEATE 10 MG PO TABS
ORAL_TABLET | ORAL | Status: AC
  Filled 2021-02-07: qty 1

## 2021-02-07 NOTE — Telephone Encounter (Signed)
Scheduled follow-up appointments per 7/12 los. Patient is aware. 

## 2021-02-07 NOTE — Patient Instructions (Signed)
[facility]  Discharge Instructions: Thank you for choosing [facility] to provide your oncology and hematology care.   If you have a lab appointment with the Cancer Center, please go directly to the Cancer Center and check in at the registration area.   Wear comfortable clothing and clothing appropriate for easy access to any Portacath or PICC line.   We strive to give you quality time with your provider. You may need to reschedule your appointment if you arrive late (15 or more minutes).  Arriving late affects you and other patients whose appointments are after yours.  Also, if you miss three or more appointments without notifying the office, you may be dismissed from the clinic at the provider's discretion.      For prescription refill requests, have your pharmacy contact our office and allow 72 hours for refills to be completed.    Today you received the following chemotherapy and/or immunotherapy agents Kyprolis      To help prevent nausea and vomiting after your treatment, we encourage you to take your nausea medication as directed.  BELOW ARE SYMPTOMS THAT SHOULD BE REPORTED IMMEDIATELY: *FEVER GREATER THAN 100.4 F (38 C) OR HIGHER *CHILLS OR SWEATING *NAUSEA AND VOMITING THAT IS NOT CONTROLLED WITH YOUR NAUSEA MEDICATION *UNUSUAL SHORTNESS OF BREATH *UNUSUAL BRUISING OR BLEEDING *URINARY PROBLEMS (pain or burning when urinating, or frequent urination) *BOWEL PROBLEMS (unusual diarrhea, constipation, pain near the anus) TENDERNESS IN MOUTH AND THROAT WITH OR WITHOUT PRESENCE OF ULCERS (sore throat, sores in mouth, or a toothache) UNUSUAL RASH, SWELLING OR PAIN  UNUSUAL VAGINAL DISCHARGE OR ITCHING   Items with * indicate a potential emergency and should be followed up as soon as possible or go to the Emergency Department if any problems should occur.  Please show the CHEMOTHERAPY ALERT CARD or IMMUNOTHERAPY ALERT CARD at check-in to  the Emergency Department and triage nurse.  Should you have questions after your visit or need to cancel or reschedule your appointment, please contact [facility]  Dept: [phone]  and follow the prompts.  Office hours are 8:00 a.m. to 4:30 p.m. Monday - Friday. Please note that voicemails left after 4:00 p.m. may not be returned until the following business day.  We are closed weekends and major holidays. You have access to a nurse at all times for urgent questions. Please call the main number to the clinic Dept: [phone] and follow the prompts.   For any non-urgent questions, you may also contact your provider using MyChart. We now offer e-Visits for anyone 18 and older to request care online for non-urgent symptoms. For details visit [email or web address].   Also download the MyChart app! Go to the app store, search "MyChart", open the app, select , and log in with your MyChart username and password.  Due to Covid, a mask is required upon entering the hospital/clinic. If you do not have a mask, one will be given to you upon arrival. For doctor visits, patients may have 1 support person aged 18 or older with them. For treatment visits, patients cannot have anyone with them due to current Covid guidelines and our immunocompromised population.   Carfilzomib injection What is this medication? CARFILZOMIB (kar FILZ oh mib) targets a specific protein within cancer cellsand stops the cancer cells from growing. It is used to treat multiple myeloma. This medicine may be used for other purposes; ask your health care provider orpharmacist if you have questions.   COMMON BRAND NAME(S): KYPROLIS What should I tell my care team before I take this medication? They need to know if you have any of these conditions: heart disease history of blood clots irregular heartbeat kidney disease liver disease lung or breathing disease an unusual or allergic reaction  to carfilzomib, or other medicines, foods, dyes, or preservatives pregnant or trying to get pregnant breast-feeding How should I use this medication? This medicine is for injection or infusion into a vein. It is given by a healthcare professional in a hospital or clinic setting. Talk to your pediatrician regarding the use of this medicine in children.Special care may be needed. Overdosage: If you think you have taken too much of this medicine contact apoison control center or emergency room at once. NOTE: This medicine is only for you. Do not share this medicine with others. What if I miss a dose? It is important not to miss your dose. Call your doctor or health careprofessional if you are unable to keep an appointment. What may interact with this medication? Interactions are not expected. This list may not describe all possible interactions. Give your health care provider a list of all the medicines, herbs, non-prescription drugs, or dietary supplements you use. Also tell them if you smoke, drink alcohol, or use illegaldrugs. Some items may interact with your medicine. What should I watch for while using this medication? Your condition will be monitored while you are receiving this medicine. You may need blood work done while you are taking this medicine. Do not become pregnant while taking this medicine or for 6 months after stopping it. Women should inform their health care provider if they wish to become pregnant or think they might be pregnant. Men should not father a child while taking this medicine and for 3 months after stopping it. There is a potential for serious side effects to an unborn child. Talk to your health care provider for more information. Do not breast-feed an infant while taking thismedicine or for 2 weeks after stopping it. Check with your health care provider if you have severe diarrhea, nausea, and vomiting, or if you sweat a lot. The loss of too much body fluid may make  itdangerous for you to take this medicine. You may get drowsy or dizzy. Do not drive, use machinery, or do anything that needs mental alertness until you know how this medicine affects you. Do not stand up or sit up quickly, especially if you are an older patient. Thisreduces the risk of dizzy or fainting spells. What side effects may I notice from receiving this medication? Side effects that you should report to your doctor or health care professionalas soon as possible: allergic reactions like skin rash, itching or hives, swelling of the face, lips, or tongue confusion dizziness feeling faint or lightheaded fever or chills palpitations seizures signs and symptoms of bleeding such as bloody or black, tarry stools; red or dark-brown urine; spitting up blood or brown material that looks like coffee grounds; red spots on the skin; unusual bruising or bleeding including from the eye, gums, or nose signs and symptoms of a blood clot such as breathing problems; changes in vision; chest pain; severe, sudden headache; pain, swelling, warmth in the leg; trouble speaking; sudden numbness or weakness of the face, arm or leg signs and symptoms of kidney injury like trouble passing urine or change in the amount of urine signs and symptoms of liver injury like dark yellow or brown urine; general ill feeling or flu-like symptoms;   light-colored stools; loss of appetite; nausea; right upper belly pain; unusually weak or tired; yellowing of the eyes or skin Side effects that usually do not require medical attention (report to yourdoctor or health care professional if they continue or are bothersome): back pain cough diarrhea headache muscle cramps trouble sleeping vomiting This list may not describe all possible side effects. Call your doctor for medical advice about side effects. You may report side effects to FDA at1-800-FDA-1088. Where should I keep my medication? This drug is given in a hospital or  clinic and will not be stored at home. NOTE: This sheet is a summary. It may not cover all possible information. If you have questions about this medicine, talk to your doctor, pharmacist, orhealth care provider.  2022 Elsevier/Gold Standard (2020-08-04 15:24:55)   

## 2021-02-12 ENCOUNTER — Other Ambulatory Visit: Payer: Self-pay

## 2021-02-12 ENCOUNTER — Encounter: Payer: Self-pay | Admitting: Hematology

## 2021-02-12 DIAGNOSIS — C9000 Multiple myeloma not having achieved remission: Secondary | ICD-10-CM

## 2021-02-13 ENCOUNTER — Inpatient Hospital Stay: Payer: Medicare HMO

## 2021-02-13 ENCOUNTER — Other Ambulatory Visit: Payer: Self-pay

## 2021-02-13 VITALS — BP 138/58 | HR 55 | Temp 98.0°F | Resp 16 | Wt 184.8 lb

## 2021-02-13 DIAGNOSIS — C9000 Multiple myeloma not having achieved remission: Secondary | ICD-10-CM | POA: Diagnosis not present

## 2021-02-13 DIAGNOSIS — D696 Thrombocytopenia, unspecified: Secondary | ICD-10-CM | POA: Diagnosis not present

## 2021-02-13 DIAGNOSIS — Z87891 Personal history of nicotine dependence: Secondary | ICD-10-CM | POA: Diagnosis not present

## 2021-02-13 DIAGNOSIS — Z298 Encounter for other specified prophylactic measures: Secondary | ICD-10-CM | POA: Diagnosis not present

## 2021-02-13 DIAGNOSIS — Z5112 Encounter for antineoplastic immunotherapy: Secondary | ICD-10-CM | POA: Diagnosis not present

## 2021-02-13 DIAGNOSIS — Z7189 Other specified counseling: Secondary | ICD-10-CM

## 2021-02-13 DIAGNOSIS — D72819 Decreased white blood cell count, unspecified: Secondary | ICD-10-CM | POA: Diagnosis not present

## 2021-02-13 DIAGNOSIS — Z79899 Other long term (current) drug therapy: Secondary | ICD-10-CM | POA: Diagnosis not present

## 2021-02-13 DIAGNOSIS — N421 Congestion and hemorrhage of prostate: Secondary | ICD-10-CM | POA: Diagnosis not present

## 2021-02-13 LAB — MULTIPLE MYELOMA PANEL, SERUM
Albumin SerPl Elph-Mcnc: 4.2 g/dL (ref 2.9–4.4)
Albumin/Glob SerPl: 1.9 — ABNORMAL HIGH (ref 0.7–1.7)
Alpha 1: 0.2 g/dL (ref 0.0–0.4)
Alpha2 Glob SerPl Elph-Mcnc: 0.6 g/dL (ref 0.4–1.0)
B-Globulin SerPl Elph-Mcnc: 0.9 g/dL (ref 0.7–1.3)
Gamma Glob SerPl Elph-Mcnc: 0.5 g/dL (ref 0.4–1.8)
Globulin, Total: 2.3 g/dL (ref 2.2–3.9)
IgA: 123 mg/dL (ref 61–437)
IgG (Immunoglobin G), Serum: 575 mg/dL — ABNORMAL LOW (ref 603–1613)
IgM (Immunoglobulin M), Srm: 19 mg/dL (ref 15–143)
Total Protein ELP: 6.5 g/dL (ref 6.0–8.5)

## 2021-02-13 LAB — CBC WITH DIFFERENTIAL (CANCER CENTER ONLY)
Abs Immature Granulocytes: 0 10*3/uL (ref 0.00–0.07)
Basophils Absolute: 0 10*3/uL (ref 0.0–0.1)
Basophils Relative: 0 %
Eosinophils Absolute: 0.2 10*3/uL (ref 0.0–0.5)
Eosinophils Relative: 6 %
HCT: 35 % — ABNORMAL LOW (ref 39.0–52.0)
Hemoglobin: 12.5 g/dL — ABNORMAL LOW (ref 13.0–17.0)
Immature Granulocytes: 0 %
Lymphocytes Relative: 33 %
Lymphs Abs: 1 10*3/uL (ref 0.7–4.0)
MCH: 30.6 pg (ref 26.0–34.0)
MCHC: 35.7 g/dL (ref 30.0–36.0)
MCV: 85.8 fL (ref 80.0–100.0)
Monocytes Absolute: 0.3 10*3/uL (ref 0.1–1.0)
Monocytes Relative: 11 %
Neutro Abs: 1.4 10*3/uL — ABNORMAL LOW (ref 1.7–7.7)
Neutrophils Relative %: 50 %
Platelet Count: 90 10*3/uL — ABNORMAL LOW (ref 150–400)
RBC: 4.08 MIL/uL — ABNORMAL LOW (ref 4.22–5.81)
RDW: 12.9 % (ref 11.5–15.5)
WBC Count: 2.9 10*3/uL — ABNORMAL LOW (ref 4.0–10.5)
nRBC: 0 % (ref 0.0–0.2)

## 2021-02-13 LAB — CMP (CANCER CENTER ONLY)
ALT: 23 U/L (ref 0–44)
AST: 18 U/L (ref 15–41)
Albumin: 3.7 g/dL (ref 3.5–5.0)
Alkaline Phosphatase: 59 U/L (ref 38–126)
Anion gap: 7 (ref 5–15)
BUN: 11 mg/dL (ref 8–23)
CO2: 27 mmol/L (ref 22–32)
Calcium: 9 mg/dL (ref 8.9–10.3)
Chloride: 105 mmol/L (ref 98–111)
Creatinine: 1.09 mg/dL (ref 0.61–1.24)
GFR, Estimated: >60 mL/min (ref >60)
Glucose, Bld: 109 mg/dL — ABNORMAL HIGH (ref 70–99)
Potassium: 4.1 mmol/L (ref 3.5–5.1)
Sodium: 139 mmol/L (ref 135–145)
Total Bilirubin: 1.1 mg/dL (ref 0.3–1.2)
Total Protein: 6.1 g/dL — ABNORMAL LOW (ref 6.5–8.1)

## 2021-02-13 MED ORDER — SODIUM CHLORIDE 0.9 % IV SOLN
Freq: Once | INTRAVENOUS | Status: AC
  Filled 2021-02-13: qty 250

## 2021-02-13 MED ORDER — DEXTROSE 5 % IV SOLN
35.0000 mg/m2 | Freq: Once | INTRAVENOUS | Status: AC
  Administered 2021-02-13: 70 mg via INTRAVENOUS
  Filled 2021-02-13: qty 30

## 2021-02-13 MED ORDER — SODIUM CHLORIDE 0.9 % IV SOLN
Freq: Once | INTRAVENOUS | Status: DC
  Filled 2021-02-13: qty 250

## 2021-02-13 MED ORDER — SODIUM CHLORIDE 0.9 % IV SOLN
20.0000 mg | Freq: Once | INTRAVENOUS | Status: AC
  Administered 2021-02-13: 20 mg via INTRAVENOUS
  Filled 2021-02-13: qty 20

## 2021-02-13 MED ORDER — DEXTROSE 5 % IV SOLN: 36.0000 mg/m2 | Freq: Once | INTRAVENOUS | Status: DC

## 2021-02-13 MED ORDER — ZOLEDRONIC ACID 4 MG/100ML IV SOLN
4.0000 mg | Freq: Once | INTRAVENOUS | Status: AC
  Administered 2021-02-13: 4 mg via INTRAVENOUS

## 2021-02-13 MED ORDER — ZOLEDRONIC ACID 4 MG/100ML IV SOLN
INTRAVENOUS | Status: AC
  Filled 2021-02-13: qty 100

## 2021-02-13 NOTE — Progress Notes (Signed)
Per Dr. Irene Limbo, "OK To Treat with ANC of 1.4 and plts of 90 today".

## 2021-02-13 NOTE — Patient Instructions (Signed)
[facility]  Discharge Instructions: Thank you for choosing [facility] to provide your oncology and hematology care.   If you have a lab appointment with the [facility], please go directly to the [facility] and check in at the registration area.   Wear comfortable clothing and clothing appropriate for easy access to any Portacath or PICC line.   We strive to give you quality time with your provider. You may need to reschedule your appointment if you arrive late (15 or more minutes).  Arriving late affects you and other patients whose appointments are after yours.  Also, if you miss three or more appointments without notifying the office, you may be dismissed from the clinic at the provider's discretion.      For prescription refill requests, have your pharmacy contact our office and allow 72 hours for refills to be completed.    Today you received the following chemotherapy and/or immunotherapy agents Carfilzomib and Zometa.      To help prevent nausea and vomiting after your treatment, we encourage you to take your nausea medication as directed.  BELOW ARE SYMPTOMS THAT SHOULD BE REPORTED IMMEDIATELY: *FEVER GREATER THAN 100.4 F (38 C) OR HIGHER *CHILLS OR SWEATING *NAUSEA AND VOMITING THAT IS NOT CONTROLLED WITH YOUR NAUSEA MEDICATION *UNUSUAL SHORTNESS OF BREATH *UNUSUAL BRUISING OR BLEEDING *URINARY PROBLEMS (pain or burning when urinating, or frequent urination) *BOWEL PROBLEMS (unusual diarrhea, constipation, pain near the anus) TENDERNESS IN MOUTH AND THROAT WITH OR WITHOUT PRESENCE OF ULCERS (sore throat, sores in mouth, or a toothache) UNUSUAL RASH, SWELLING OR PAIN  UNUSUAL VAGINAL DISCHARGE OR ITCHING   Items with * indicate a potential emergency and should be followed up as soon as possible or go to the Emergency Department if any problems should occur.  Please show the CHEMOTHERAPY ALERT CARD or IMMUNOTHERAPY ALERT CARD at  check-in to the Emergency Department and triage nurse.  Should you have questions after your visit or need to cancel or reschedule your appointment, please contact [facility]  Dept: [phone]  and follow the prompts.  Office hours are 8:00 a.m. to 4:30 p.m. Monday - Friday. Please note that voicemails left after 4:00 p.m. may not be returned until the following business day.  We are closed weekends and major holidays. You have access to a nurse at all times for urgent questions. Please call the main number to the clinic Dept: [phone] and follow the prompts.   For any non-urgent questions, you may also contact your provider using MyChart. We now offer e-Visits for anyone 67 and older to request care online for non-urgent symptoms. For details visit [email or web address].   Also download the MyChart app! Go to the app store, search "MyChart", open the app, select [facility], and log in with your MyChart username and password.  Due to Covid, a mask is required upon entering the hospital/clinic. If you do not have a mask, one will be given to you upon arrival. For doctor visits, patients may have 1 support person aged 80 or older with them. For treatment visits, patients cannot have anyone with them due to current Covid guidelines and our immunocompromised population.   Zoledronic Acid Injection (Hypercalcemia, Oncology) What is this medication? ZOLEDRONIC ACID (ZOE le dron ik AS id) slows calcium loss from bones. It high calcium levels in the blood from some kinds of cancer. It may be used in otherpeople at risk for bone loss. This medicine  may be used for other purposes; ask your health care provider orpharmacist if you have questions. COMMON BRAND NAME(S): Zometa What should I tell my care team before I take this medication? They need to know if you have any of these conditions: cancer dehydration dental disease kidney disease liver disease low levels  of calcium in the blood lung or breathing disease (asthma) receiving steroids like dexamethasone or prednisone an unusual or allergic reaction to zoledronic acid, other medicines, foods, dyes, or preservatives pregnant or trying to get pregnant breast-feeding How should I use this medication? This drug is injected into a vein. It is given by a health care provider in [place] or clinic setting. Talk to your health care provider about the use of this drug in children.Special care may be needed. Overdosage: If you think you have taken too much of this medicine contact apoison control center or emergency room at once. NOTE: This medicine is only for you. Do not share this medicine with others. What if I miss a dose? Keep appointments for follow-up doses. It is important not to miss your dose.Call your health care provider if you are unable to keep an appointment. What may interact with this medication? certain antibiotics given by injection NSAIDs, medicines for pain and inflammation, like ibuprofen or naproxen some diuretics like bumetanide, furosemide teriparatide thalidomide This list may not describe all possible interactions. Give your health care provider a list of all the medicines, herbs, non-prescription drugs, or dietary supplements you use. Also tell them if you smoke, drink alcohol, or use illegaldrugs. Some items may interact with your medicine. What should I watch for while using this medication? Visit your health care provider for regular checks on your progress. It may besome time before you see the benefit from this drug. Some people who take this drug have severe bone, joint, or muscle pain. This drug may also increase your risk for jaw problems or a broken thigh bone. Tell your health care provider right away if you have severe pain in your jaw, bones, joints, or muscles. Tell you health care provider if you have any painthat does not go away or that gets worse. Tell your  dentist and dental surgeon that you are taking this drug. You should not have major dental surgery while on this drug. See your dentist to have a dental exam and fix any dental problems before starting this drug. Take good care of your teeth while on this drug. Make sure you see your dentist forregular follow-up appointments. You should make sure you get enough calcium and vitamin D while you are taking this drug. Discuss the foods you eat and the vitamins you take with your healthcare provider. Check with your health care provider if you have severe diarrhea, nausea, and vomiting, or if you sweat a lot. The loss of too much body fluid may make itdangerous for you to take this drug. You may need blood work done while you are taking this drug. Do not become pregnant while taking this drug. Women should inform their health care provider if they wish to become pregnant or think they might be pregnant. There is potential for serious harm to an unborn child. Talk to your healthcare provider for more information. What side effects may I notice from receiving this medication? Side effects that you should report to your doctor or health care provider assoon as possible: allergic reactions (skin rash, itching or hives; swelling of the face, lips, or tongue) bone pain infection (fever, chills,  cough, sore throat, pain or trouble passing urine) jaw pain, especially after dental work joint pain kidney injury (trouble passing urine or change in the amount of urine) low blood pressure (dizziness; feeling faint or lightheaded, falls; unusually weak or tired) low calcium levels (fast heartbeat; muscle cramps or pain; pain, tingling, or numbness in the hands or feet; seizures) low magnesium levels (fast, irregular heartbeat; muscle cramp or pain; muscle weakness; tremors; seizures) low red blood cell counts (trouble breathing; feeling faint; lightheaded, falls; unusually weak or tired) muscle pain redness,  blistering, peeling, or loosening of the skin, including inside the mouth severe diarrhea swelling of the ankles, feet, hands trouble breathing Side effects that usually do not require medical attention (report to yourdoctor or health care provider if they continue or are bothersome): anxious constipation coughing depressed mood eye irritation, itching, or pain fever general ill feeling or flu-like symptoms nausea pain, redness, or irritation at site where injected trouble sleeping This list may not describe all possible side effects. Call your doctor for medical advice about side effects. You may report side effects to FDA at1-800-FDA-1088. Where should I keep my medication? This drug is given in a hospital or clinic. It will not be stored at home. NOTE: This sheet is a summary. It may not cover all possible information. If you have questions about this medicine, talk to your doctor, pharmacist, orhealth care provider.  2022 Elsevier/Gold Standard (2019-04-29 09:13:00)

## 2021-02-14 ENCOUNTER — Inpatient Hospital Stay: Payer: Medicare HMO

## 2021-02-14 ENCOUNTER — Other Ambulatory Visit: Payer: Self-pay

## 2021-02-14 ENCOUNTER — Ambulatory Visit: Payer: Medicare HMO | Admitting: Hematology

## 2021-02-14 ENCOUNTER — Other Ambulatory Visit: Payer: Self-pay | Admitting: Adult Health

## 2021-02-14 VITALS — BP 139/67 | HR 63 | Temp 98.1°F

## 2021-02-14 DIAGNOSIS — Z87891 Personal history of nicotine dependence: Secondary | ICD-10-CM | POA: Diagnosis not present

## 2021-02-14 DIAGNOSIS — C9000 Multiple myeloma not having achieved remission: Secondary | ICD-10-CM

## 2021-02-14 DIAGNOSIS — Z79899 Other long term (current) drug therapy: Secondary | ICD-10-CM | POA: Diagnosis not present

## 2021-02-14 DIAGNOSIS — N421 Congestion and hemorrhage of prostate: Secondary | ICD-10-CM | POA: Diagnosis not present

## 2021-02-14 DIAGNOSIS — D696 Thrombocytopenia, unspecified: Secondary | ICD-10-CM | POA: Diagnosis not present

## 2021-02-14 DIAGNOSIS — D72819 Decreased white blood cell count, unspecified: Secondary | ICD-10-CM | POA: Diagnosis not present

## 2021-02-14 DIAGNOSIS — Z7189 Other specified counseling: Secondary | ICD-10-CM

## 2021-02-14 DIAGNOSIS — Z5112 Encounter for antineoplastic immunotherapy: Secondary | ICD-10-CM | POA: Diagnosis not present

## 2021-02-14 DIAGNOSIS — Z298 Encounter for other specified prophylactic measures: Secondary | ICD-10-CM | POA: Diagnosis not present

## 2021-02-14 MED ORDER — SODIUM CHLORIDE 0.9 % IV SOLN
20.0000 mg | Freq: Once | INTRAVENOUS | Status: AC
  Administered 2021-02-14: 20 mg via INTRAVENOUS
  Filled 2021-02-14: qty 20

## 2021-02-14 MED ORDER — CILGAVIMAB (PART OF EVUSHELD) INJECTION
300.0000 mg | Freq: Once | INTRAMUSCULAR | Status: AC
  Administered 2021-02-14: 300 mg via INTRAMUSCULAR
  Filled 2021-02-14: qty 3

## 2021-02-14 MED ORDER — SODIUM CHLORIDE 0.9 % IV SOLN
Freq: Once | INTRAVENOUS | Status: AC
  Filled 2021-02-14: qty 250

## 2021-02-14 MED ORDER — TIXAGEVIMAB (PART OF EVUSHELD) INJECTION
300.0000 mg | Freq: Once | INTRAMUSCULAR | Status: AC
  Administered 2021-02-14: 300 mg via INTRAMUSCULAR
  Filled 2021-02-14: qty 3

## 2021-02-14 MED ORDER — DEXTROSE 5 % IV SOLN
35.0000 mg/m2 | Freq: Once | INTRAVENOUS | Status: AC
  Administered 2021-02-14: 70 mg via INTRAVENOUS
  Filled 2021-02-14: qty 30

## 2021-02-14 MED ORDER — PROCHLORPERAZINE MALEATE 10 MG PO TABS
ORAL_TABLET | ORAL | Status: AC
  Filled 2021-02-14: qty 1

## 2021-02-14 MED ORDER — EPINEPHRINE 0.3 MG/0.3ML IJ SOAJ: 0.3000 mg | Freq: Once | INTRAMUSCULAR | Status: DC | PRN

## 2021-02-14 MED ORDER — PROCHLORPERAZINE MALEATE 10 MG PO TABS
10.0000 mg | ORAL_TABLET | Freq: Once | ORAL | Status: AC
  Administered 2021-02-14: 10 mg via ORAL

## 2021-02-14 NOTE — Patient Instructions (Signed)
[facility]  Discharge Instructions: Thank you for choosing [facility] to provide your oncology and hematology care.   If you have a lab appointment with the [facility], please go directly to the [facility] and check in at the registration area.   Wear comfortable clothing and clothing appropriate for easy access to any Portacath or PICC line.   We strive to give you quality time with your provider. You may need to reschedule your appointment if you arrive late (15 or more minutes).  Arriving late affects you and other patients whose appointments are after yours.  Also, if you miss three or more appointments without notifying the office, you may be dismissed from the clinic at the provider's discretion.      For prescription refill requests, have your pharmacy contact our office and allow 72 hours for refills to be completed.    Today you received the following chemotherapy and/or immunotherapy agents: Kyprolis, Evushield    To help prevent nausea and vomiting after your treatment, we encourage you to take your nausea medication as directed.  BELOW ARE SYMPTOMS THAT SHOULD BE REPORTED IMMEDIATELY: *FEVER GREATER THAN 100.4 F (38 C) OR HIGHER *CHILLS OR SWEATING *NAUSEA AND VOMITING THAT IS NOT CONTROLLED WITH YOUR NAUSEA MEDICATION *UNUSUAL SHORTNESS OF BREATH *UNUSUAL BRUISING OR BLEEDING *URINARY PROBLEMS (pain or burning when urinating, or frequent urination) *BOWEL PROBLEMS (unusual diarrhea, constipation, pain near the anus) TENDERNESS IN MOUTH AND THROAT WITH OR WITHOUT PRESENCE OF ULCERS (sore throat, sores in mouth, or a toothache) UNUSUAL RASH, SWELLING OR PAIN  UNUSUAL VAGINAL DISCHARGE OR ITCHING   Items with * indicate a potential emergency and should be followed up as soon as possible or go to the Emergency Department if any problems should occur.  Please show the CHEMOTHERAPY ALERT CARD or IMMUNOTHERAPY ALERT CARD at  check-in to the Emergency Department and triage nurse.  Should you have questions after your visit or need to cancel or reschedule your appointment, please contact [facility]  Dept: [phone]  and follow the prompts.  Office hours are 8:00 a.m. to 4:30 p.m. Monday - Friday. Please note that voicemails left after 4:00 p.m. may not be returned until the following business day.  We are closed weekends and major holidays. You have access to a nurse at all times for urgent questions. Please call the main number to the clinic Dept: [phone] and follow the prompts.   For any non-urgent questions, you may also contact your provider using MyChart. We now offer e-Visits for anyone 66 and older to request care online for non-urgent symptoms. For details visit [email or web address].   Also download the MyChart app! Go to the app store, search "MyChart", open the app, select [facility], and log in with your MyChart username and password.  Due to Covid, a mask is required upon entering the hospital/clinic. If you do not have a mask, one will be given to you upon arrival. For doctor visits, patients may have 1 support person aged 64 or older with them. For treatment visits, patients cannot have anyone with them due to current Covid guidelines and our immunocompromised population.

## 2021-02-14 NOTE — Progress Notes (Signed)
I connected by phone with Brian Levine on 02/14/2021, 1:29 PM to discuss the potential use of a new treatment, tixagevimab/cilgavimab, for pre-exposure prophylaxis for prevention of coronavirus disease 2019 (COVID-19) caused by the SARS-CoV-2 virus.  This patient is a 75 y.o. male that meets the FDA criteria for Emergency Use Authorization of tixagevimab/cilgavimab for pre-exposure prophylaxis of COVID-19 disease. Pt meets following criteria: Age >12 yr and weight > 40kg Not currently infected with SARS-CoV-2 and has no known recent exposure to an individual infected with SARS-CoV-2 AND Who has moderate to severe immune compromise due to a medical condition or receipt of immunosuppressive medications or treatments and may not mount an adequate immune response to COVID-19 vaccination or  Vaccination with any available COVID-19 vaccine, according to the approved or authorized schedule, is not recommended due to a history of severe adverse reaction (e.g., severe allergic reaction) to a COVID-19 vaccine(s) and/or COVID-19 vaccine component(s).  Patient meets the following definition of mod-severe immune compromised status: 4. Lung transplants, other solid organ transplant recipients on continual belatacept therapy, or multiple myeloma (actively receiving treatment)  I have spoken and communicated the following to the patient or parent/caregiver regarding COVID monoclonal antibody treatment:  FDA has authorized the emergency use of tixagevimab/cilgavimab for the pre-exposure prophylaxis of COVID-19 in patients with moderate-severe immunocompromised status, who meet above EUA criteria.  The significant known and potential risks and benefits of COVID monoclonal antibody, and the extent to which such potential risks and benefits are unknown.  Information on available alternative treatments and the risks and benefits of those alternatives, including clinical trials.  The patient or parent/caregiver has the  option to accept or refuse COVID monoclonal antibody treatment.  After reviewing this information with the patient, agree to receive tixagevimab/cilgavimab.  He will receive this today when he receives his Carfilzomib.  Orders placed.    Scot Dock, NP, 02/14/2021, 1:29 PM

## 2021-02-19 ENCOUNTER — Encounter: Payer: Self-pay | Admitting: Hematology

## 2021-02-19 ENCOUNTER — Other Ambulatory Visit: Payer: Self-pay

## 2021-02-19 DIAGNOSIS — C9000 Multiple myeloma not having achieved remission: Secondary | ICD-10-CM

## 2021-02-19 NOTE — Progress Notes (Signed)
Called pt to introduce myself as his Arboriculturist.  I informed him of copay assistance w/ the [employer], pt wanted to apply so he gave me consent to apply in his behalf so I completed the online application and he was approved for $12,000 for 12 months from 02/19/21 w/ a 6 month look back period.  I will give him a copy of the PAF approval letter and my card for any questions or concerns he may have in the future.  Pt is overqualified for the [employer].

## 2021-02-20 ENCOUNTER — Inpatient Hospital Stay: Payer: Medicare HMO

## 2021-02-20 ENCOUNTER — Other Ambulatory Visit: Payer: Self-pay

## 2021-02-20 VITALS — BP 135/58 | HR 61 | Temp 98.1°F | Resp 18 | Wt 186.0 lb

## 2021-02-20 DIAGNOSIS — D696 Thrombocytopenia, unspecified: Secondary | ICD-10-CM | POA: Diagnosis not present

## 2021-02-20 DIAGNOSIS — C9000 Multiple myeloma not having achieved remission: Secondary | ICD-10-CM

## 2021-02-20 DIAGNOSIS — Z87891 Personal history of nicotine dependence: Secondary | ICD-10-CM | POA: Diagnosis not present

## 2021-02-20 DIAGNOSIS — Z7189 Other specified counseling: Secondary | ICD-10-CM

## 2021-02-20 DIAGNOSIS — D72819 Decreased white blood cell count, unspecified: Secondary | ICD-10-CM | POA: Diagnosis not present

## 2021-02-20 DIAGNOSIS — Z298 Encounter for other specified prophylactic measures: Secondary | ICD-10-CM | POA: Diagnosis not present

## 2021-02-20 DIAGNOSIS — Z79899 Other long term (current) drug therapy: Secondary | ICD-10-CM | POA: Diagnosis not present

## 2021-02-20 DIAGNOSIS — N421 Congestion and hemorrhage of prostate: Secondary | ICD-10-CM | POA: Diagnosis not present

## 2021-02-20 DIAGNOSIS — Z5112 Encounter for antineoplastic immunotherapy: Secondary | ICD-10-CM | POA: Diagnosis not present

## 2021-02-20 LAB — CBC WITH DIFFERENTIAL (CANCER CENTER ONLY)
Abs Immature Granulocytes: 0 10*3/uL (ref 0.00–0.07)
Basophils Absolute: 0 10*3/uL (ref 0.0–0.1)
Basophils Relative: 0 %
Eosinophils Absolute: 0.2 10*3/uL (ref 0.0–0.5)
Eosinophils Relative: 6 %
HCT: 30.6 % — ABNORMAL LOW (ref 39.0–52.0)
Hemoglobin: 11.2 g/dL — ABNORMAL LOW (ref 13.0–17.0)
Immature Granulocytes: 0 %
Lymphocytes Relative: 27 %
Lymphs Abs: 0.9 10*3/uL (ref 0.7–4.0)
MCH: 31.1 pg (ref 26.0–34.0)
MCHC: 36.6 g/dL — ABNORMAL HIGH (ref 30.0–36.0)
MCV: 85 fL (ref 80.0–100.0)
Monocytes Absolute: 0.5 10*3/uL (ref 0.1–1.0)
Monocytes Relative: 16 %
Neutro Abs: 1.7 10*3/uL (ref 1.7–7.7)
Neutrophils Relative %: 51 %
Platelet Count: 66 10*3/uL — ABNORMAL LOW (ref 150–400)
RBC: 3.6 MIL/uL — ABNORMAL LOW (ref 4.22–5.81)
RDW: 12.7 % (ref 11.5–15.5)
WBC Count: 3.3 10*3/uL — ABNORMAL LOW (ref 4.0–10.5)
nRBC: 0 % (ref 0.0–0.2)

## 2021-02-20 LAB — CMP (CANCER CENTER ONLY)
ALT: 49 U/L — ABNORMAL HIGH (ref 0–44)
AST: 46 U/L — ABNORMAL HIGH (ref 15–41)
Albumin: 3.4 g/dL — ABNORMAL LOW (ref 3.5–5.0)
Alkaline Phosphatase: 59 U/L (ref 38–126)
Anion gap: 8 (ref 5–15)
BUN: 12 mg/dL (ref 8–23)
CO2: 23 mmol/L (ref 22–32)
Calcium: 8.5 mg/dL — ABNORMAL LOW (ref 8.9–10.3)
Chloride: 106 mmol/L (ref 98–111)
Creatinine: 1.08 mg/dL (ref 0.61–1.24)
GFR, Estimated: >60 mL/min (ref >60)
Glucose, Bld: 111 mg/dL — ABNORMAL HIGH (ref 70–99)
Potassium: 4.1 mmol/L (ref 3.5–5.1)
Sodium: 137 mmol/L (ref 135–145)
Total Bilirubin: 1.1 mg/dL (ref 0.3–1.2)
Total Protein: 5.8 g/dL — ABNORMAL LOW (ref 6.5–8.1)

## 2021-02-20 MED ORDER — DEXAMETHASONE SODIUM PHOSPHATE 100 MG/10ML IJ SOLN
20.0000 mg | Freq: Once | INTRAMUSCULAR | Status: AC
  Administered 2021-02-20: 20 mg via INTRAVENOUS
  Filled 2021-02-20: qty 20

## 2021-02-20 MED ORDER — DEXTROSE 5 % IV SOLN
35.0000 mg/m2 | Freq: Once | INTRAVENOUS | Status: AC
  Administered 2021-02-20: 70 mg via INTRAVENOUS
  Filled 2021-02-20: qty 30

## 2021-02-20 MED ORDER — SODIUM CHLORIDE 0.9 % IV SOLN
Freq: Once | INTRAVENOUS | Status: AC
  Filled 2021-02-20: qty 250

## 2021-02-20 NOTE — Patient Instructions (Signed)
[facility]  Discharge Instructions: Thank you for choosing [facility] to provide your oncology and hematology care.   If you have a lab appointment with the Cancer Center, please go directly to the Cancer Center and check in at the registration area.   Wear comfortable clothing and clothing appropriate for easy access to any Portacath or PICC line.   We strive to give you quality time with your provider. You may need to reschedule your appointment if you arrive late (15 or more minutes).  Arriving late affects you and other patients whose appointments are after yours.  Also, if you miss three or more appointments without notifying the office, you may be dismissed from the clinic at the provider's discretion.      For prescription refill requests, have your pharmacy contact our office and allow 72 hours for refills to be completed.    Today you received the following chemotherapy and/or immunotherapy agents: Kyprolis    To help prevent nausea and vomiting after your treatment, we encourage you to take your nausea medication as directed.  BELOW ARE SYMPTOMS THAT SHOULD BE REPORTED IMMEDIATELY: . *FEVER GREATER THAN 100.4 F (38 C) OR HIGHER . *CHILLS OR SWEATING . *NAUSEA AND VOMITING THAT IS NOT CONTROLLED WITH YOUR NAUSEA MEDICATION . *UNUSUAL SHORTNESS OF BREATH . *UNUSUAL BRUISING OR BLEEDING . *URINARY PROBLEMS (pain or burning when urinating, or frequent urination) . *BOWEL PROBLEMS (unusual diarrhea, constipation, pain near the anus) . TENDERNESS IN MOUTH AND THROAT WITH OR WITHOUT PRESENCE OF ULCERS (sore throat, sores in mouth, or a toothache) . UNUSUAL RASH, SWELLING OR PAIN  . UNUSUAL VAGINAL DISCHARGE OR ITCHING   Items with * indicate a potential emergency and should be followed up as soon as possible or go to the Emergency Department if any problems should occur.  Please show the CHEMOTHERAPY ALERT CARD or IMMUNOTHERAPY ALERT  CARD at check-in to the Emergency Department and triage nurse.  Should you have questions after your visit or need to cancel or reschedule your appointment, please contact [facility]  Dept: [phone]  and follow the prompts.  Office hours are 8:00 a.m. to 4:30 p.m. Monday - Friday. Please note that voicemails left after 4:00 p.m. may not be returned until the following business day.  We are closed weekends and major holidays. You have access to a nurse at all times for urgent questions. Please call the main number to the clinic Dept: [phone] and follow the prompts.   For any non-urgent questions, you may also contact your provider using MyChart. We now offer e-Visits for anyone 18 and older to request care online for non-urgent symptoms. For details visit [email or web address].   Also download the MyChart app! Go to the app store, search "MyChart", open the app, select , and log in with your MyChart username and password.  Due to Covid, a mask is required upon entering the hospital/clinic. If you do not have a mask, one will be given to you upon arrival. For doctor visits, patients may have 1 support person aged 18 or older with them. For treatment visits, patients cannot have anyone with them due to current Covid guidelines and our immunocompromised population.   

## 2021-02-20 NOTE — Progress Notes (Signed)
Per Dr. Irene Limbo, ok to treat with platelets 66.  MAR edited on 03/12/21 per message from Genelle Bal regarding coding query.

## 2021-02-21 ENCOUNTER — Inpatient Hospital Stay: Payer: Medicare HMO

## 2021-02-21 VITALS — BP 144/75 | HR 56 | Temp 97.8°F | Resp 17 | Wt 188.8 lb

## 2021-02-21 DIAGNOSIS — Z298 Encounter for other specified prophylactic measures: Secondary | ICD-10-CM | POA: Diagnosis not present

## 2021-02-21 DIAGNOSIS — C9000 Multiple myeloma not having achieved remission: Secondary | ICD-10-CM | POA: Diagnosis not present

## 2021-02-21 DIAGNOSIS — Z7189 Other specified counseling: Secondary | ICD-10-CM

## 2021-02-21 DIAGNOSIS — D72819 Decreased white blood cell count, unspecified: Secondary | ICD-10-CM | POA: Diagnosis not present

## 2021-02-21 DIAGNOSIS — Z87891 Personal history of nicotine dependence: Secondary | ICD-10-CM | POA: Diagnosis not present

## 2021-02-21 DIAGNOSIS — D696 Thrombocytopenia, unspecified: Secondary | ICD-10-CM | POA: Diagnosis not present

## 2021-02-21 DIAGNOSIS — N421 Congestion and hemorrhage of prostate: Secondary | ICD-10-CM | POA: Diagnosis not present

## 2021-02-21 DIAGNOSIS — Z5112 Encounter for antineoplastic immunotherapy: Secondary | ICD-10-CM | POA: Diagnosis not present

## 2021-02-21 DIAGNOSIS — Z79899 Other long term (current) drug therapy: Secondary | ICD-10-CM | POA: Diagnosis not present

## 2021-02-21 MED ORDER — SODIUM CHLORIDE 0.9 % IV SOLN
20.0000 mg | Freq: Once | INTRAVENOUS | Status: AC
  Administered 2021-02-21: 20 mg via INTRAVENOUS
  Filled 2021-02-21: qty 20

## 2021-02-21 MED ORDER — SODIUM CHLORIDE 0.9 % IV SOLN
Freq: Once | INTRAVENOUS | Status: AC
  Filled 2021-02-21: qty 250

## 2021-02-21 MED ORDER — PROCHLORPERAZINE MALEATE 10 MG PO TABS
ORAL_TABLET | ORAL | Status: AC
  Filled 2021-02-21: qty 1

## 2021-02-21 MED ORDER — PROCHLORPERAZINE MALEATE 10 MG PO TABS
10.0000 mg | ORAL_TABLET | Freq: Once | ORAL | Status: AC
  Administered 2021-02-21: 10 mg via ORAL

## 2021-02-21 MED ORDER — DEXTROSE 5 % IV SOLN
35.0000 mg/m2 | Freq: Once | INTRAVENOUS | Status: AC
  Administered 2021-02-21: 70 mg via INTRAVENOUS
  Filled 2021-02-21: qty 30

## 2021-02-21 NOTE — Patient Instructions (Signed)
[facility]  Discharge Instructions: Thank you for choosing [facility] to provide your oncology and hematology care.   If you have a lab appointment with the [facility], please go directly to the [facility] and check in at the registration area.   Wear comfortable clothing and clothing appropriate for easy access to any Portacath or PICC line.   We strive to give you quality time with your provider. You may need to reschedule your appointment if you arrive late (15 or more minutes).  Arriving late affects you and other patients whose appointments are after yours.  Also, if you miss three or more appointments without notifying the office, you may be dismissed from the clinic at the provider's discretion.      For prescription refill requests, have your pharmacy contact our office and allow 72 hours for refills to be completed.    Today you received the following chemotherapy and/or immunotherapy agents Carfilzomab      To help prevent nausea and vomiting after your treatment, we encourage you to take your nausea medication as directed.  BELOW ARE SYMPTOMS THAT SHOULD BE REPORTED IMMEDIATELY: *FEVER GREATER THAN 100.4 F (38 C) OR HIGHER *CHILLS OR SWEATING *NAUSEA AND VOMITING THAT IS NOT CONTROLLED WITH YOUR NAUSEA MEDICATION *UNUSUAL SHORTNESS OF BREATH *UNUSUAL BRUISING OR BLEEDING *URINARY PROBLEMS (pain or burning when urinating, or frequent urination) *BOWEL PROBLEMS (unusual diarrhea, constipation, pain near the anus) TENDERNESS IN MOUTH AND THROAT WITH OR WITHOUT PRESENCE OF ULCERS (sore throat, sores in mouth, or a toothache) UNUSUAL RASH, SWELLING OR PAIN  UNUSUAL VAGINAL DISCHARGE OR ITCHING   Items with * indicate a potential emergency and should be followed up as soon as possible or go to the Emergency Department if any problems should occur.  Please show the CHEMOTHERAPY ALERT CARD or IMMUNOTHERAPY ALERT CARD at check-in to  the Emergency Department and triage nurse.  Should you have questions after your visit or need to cancel or reschedule your appointment, please contact [facility]  Dept: [phone]  and follow the prompts.  Office hours are 8:00 a.m. to 4:30 p.m. Monday - Friday. Please note that voicemails left after 4:00 p.m. may not be returned until the following business day.  We are closed weekends and major holidays. You have access to a nurse at all times for urgent questions. Please call the main number to the clinic Dept: [phone] and follow the prompts.   For any non-urgent questions, you may also contact your provider using MyChart. We now offer e-Visits for anyone 53 and older to request care online for non-urgent symptoms. For details visit [email or web address].   Also download the MyChart app! Go to the app store, search "MyChart", open the app, select [facility], and log in with your MyChart username and password.  Due to Covid, a mask is required upon entering the hospital/clinic. If you do not have a mask, one will be given to you upon arrival. For doctor visits, patients may have 1 support person aged 51 or older with them. For treatment visits, patients cannot have anyone with them due to current Covid guidelines and our immunocompromised population.

## 2021-02-22 ENCOUNTER — Other Ambulatory Visit: Payer: Self-pay | Admitting: Hematology

## 2021-02-22 DIAGNOSIS — C9000 Multiple myeloma not having achieved remission: Secondary | ICD-10-CM

## 2021-02-22 DIAGNOSIS — Z7189 Other specified counseling: Secondary | ICD-10-CM

## 2021-02-26 ENCOUNTER — Other Ambulatory Visit: Payer: Self-pay

## 2021-02-26 ENCOUNTER — Other Ambulatory Visit: Payer: Self-pay | Admitting: Hematology

## 2021-02-26 DIAGNOSIS — Z7189 Other specified counseling: Secondary | ICD-10-CM

## 2021-02-26 DIAGNOSIS — C9000 Multiple myeloma not having achieved remission: Secondary | ICD-10-CM

## 2021-02-26 MED ORDER — LENALIDOMIDE 15 MG PO CAPS: 15.0000 mg | ORAL_CAPSULE | Freq: Every day | ORAL | 0 refills | Status: DC

## 2021-03-05 ENCOUNTER — Telehealth: Payer: Self-pay | Admitting: Hematology

## 2021-03-05 NOTE — Telephone Encounter (Signed)
Changed MD appointment due to provider's emergency. Patient is aware of changes.

## 2021-03-06 ENCOUNTER — Other Ambulatory Visit: Payer: Self-pay | Admitting: Hematology

## 2021-03-06 ENCOUNTER — Other Ambulatory Visit: Payer: Self-pay | Admitting: Hematology and Oncology

## 2021-03-06 ENCOUNTER — Other Ambulatory Visit: Payer: Self-pay

## 2021-03-06 ENCOUNTER — Inpatient Hospital Stay: Payer: Medicare HMO | Admitting: Hematology and Oncology

## 2021-03-06 ENCOUNTER — Inpatient Hospital Stay: Payer: Medicare HMO | Attending: Hematology and Oncology

## 2021-03-06 ENCOUNTER — Encounter: Payer: Self-pay | Admitting: Hematology and Oncology

## 2021-03-06 ENCOUNTER — Inpatient Hospital Stay: Payer: Medicare HMO

## 2021-03-06 DIAGNOSIS — D539 Nutritional anemia, unspecified: Secondary | ICD-10-CM | POA: Diagnosis not present

## 2021-03-06 DIAGNOSIS — C9000 Multiple myeloma not having achieved remission: Secondary | ICD-10-CM | POA: Diagnosis not present

## 2021-03-06 DIAGNOSIS — Z7189 Other specified counseling: Secondary | ICD-10-CM

## 2021-03-06 DIAGNOSIS — Z7982 Long term (current) use of aspirin: Secondary | ICD-10-CM | POA: Insufficient documentation

## 2021-03-06 DIAGNOSIS — Z5112 Encounter for antineoplastic immunotherapy: Secondary | ICD-10-CM | POA: Diagnosis not present

## 2021-03-06 DIAGNOSIS — Z79899 Other long term (current) drug therapy: Secondary | ICD-10-CM | POA: Diagnosis not present

## 2021-03-06 DIAGNOSIS — R432 Parageusia: Secondary | ICD-10-CM

## 2021-03-06 LAB — CBC WITH DIFFERENTIAL/PLATELET
Abs Immature Granulocytes: 0.01 10*3/uL (ref 0.00–0.07)
Basophils Absolute: 0.1 10*3/uL (ref 0.0–0.1)
Basophils Relative: 2 %
Eosinophils Absolute: 0.1 10*3/uL (ref 0.0–0.5)
Eosinophils Relative: 3 %
HCT: 32.7 % — ABNORMAL LOW (ref 39.0–52.0)
Hemoglobin: 11.4 g/dL — ABNORMAL LOW (ref 13.0–17.0)
Immature Granulocytes: 0 %
Lymphocytes Relative: 27 %
Lymphs Abs: 0.9 10*3/uL (ref 0.7–4.0)
MCH: 30.3 pg (ref 26.0–34.0)
MCHC: 34.9 g/dL (ref 30.0–36.0)
MCV: 87 fL (ref 80.0–100.0)
Monocytes Absolute: 0.4 10*3/uL (ref 0.1–1.0)
Monocytes Relative: 12 %
Neutro Abs: 1.8 10*3/uL (ref 1.7–7.7)
Neutrophils Relative %: 56 %
Platelets: 169 10*3/uL (ref 150–400)
RBC: 3.76 MIL/uL — ABNORMAL LOW (ref 4.22–5.81)
RDW: 13.3 % (ref 11.5–15.5)
WBC: 3.2 10*3/uL — ABNORMAL LOW (ref 4.0–10.5)
nRBC: 0 % (ref 0.0–0.2)

## 2021-03-06 LAB — COMPREHENSIVE METABOLIC PANEL
ALT: 16 U/L (ref 0–44)
AST: 16 U/L (ref 15–41)
Albumin: 3.6 g/dL (ref 3.5–5.0)
Alkaline Phosphatase: 63 U/L (ref 38–126)
Anion gap: 7 (ref 5–15)
BUN: 11 mg/dL (ref 8–23)
CO2: 23 mmol/L (ref 22–32)
Calcium: 8.5 mg/dL — ABNORMAL LOW (ref 8.9–10.3)
Chloride: 107 mmol/L (ref 98–111)
Creatinine, Ser: 1.06 mg/dL (ref 0.61–1.24)
GFR, Estimated: >60 mL/min (ref >60)
Glucose, Bld: 121 mg/dL — ABNORMAL HIGH (ref 70–99)
Potassium: 4.1 mmol/L (ref 3.5–5.1)
Sodium: 137 mmol/L (ref 135–145)
Total Bilirubin: 1.1 mg/dL (ref 0.3–1.2)
Total Protein: 5.9 g/dL — ABNORMAL LOW (ref 6.5–8.1)

## 2021-03-06 LAB — ABO/RH: ABO/RH(D): A POS

## 2021-03-06 LAB — VITAMIN B12: Vitamin B-12: 426 pg/mL (ref 180–914)

## 2021-03-06 LAB — FERRITIN: Ferritin: 140 ng/mL (ref 24–336)

## 2021-03-06 LAB — IRON AND TIBC
Iron: 56 ug/dL (ref 42–163)
Saturation Ratios: 19 % — ABNORMAL LOW (ref 20–55)
TIBC: 300 ug/dL (ref 202–409)
UIBC: 244 ug/dL (ref 117–376)

## 2021-03-06 LAB — SEDIMENTATION RATE: Sed Rate: 12 mm/hr (ref 0–16)

## 2021-03-06 MED ORDER — SODIUM CHLORIDE 0.9 % IV SOLN
Freq: Once | INTRAVENOUS | Status: DC
  Filled 2021-03-06: qty 250

## 2021-03-06 MED ORDER — SODIUM CHLORIDE 0.9 % IV SOLN
20.0000 mg | Freq: Once | INTRAVENOUS | Status: AC
  Administered 2021-03-06: 20 mg via INTRAVENOUS
  Filled 2021-03-06: qty 20

## 2021-03-06 MED ORDER — CARFILZOMIB CHEMO INJECTION 60 MG: 36.0000 mg/m2 | Freq: Once | INTRAVENOUS | Status: DC

## 2021-03-06 MED ORDER — SODIUM CHLORIDE 0.9 % IV SOLN
Freq: Once | INTRAVENOUS | Status: AC
  Filled 2021-03-06: qty 250

## 2021-03-06 MED ORDER — DEXTROSE 5 % IV SOLN
35.0000 mg/m2 | Freq: Once | INTRAVENOUS | Status: AC
  Administered 2021-03-06: 70 mg via INTRAVENOUS
  Filled 2021-03-06: qty 30

## 2021-03-06 NOTE — Assessment & Plan Note (Addendum)
I have ordered myeloma panel today We will call him with test results Once I confirm he has good response to treatment, we can reduce the dose of dexamethasone I reminded him the importance of calcium with vitamin D supplement I also reminded him the importance of acyclovir for antimicrobial prophylaxis I reminded him the importance of aspirin for DVT prophylaxis

## 2021-03-06 NOTE — Assessment & Plan Note (Signed)
He has recent pancytopenia likely due to side effects of treatment His blood counts are improved He is not symptomatic I have order vitamin B12 and iron studies and we will call him with test results

## 2021-03-06 NOTE — Progress Notes (Signed)
[facility] progress notes  Patient Care Team: Lavone Orn, MD as PCP - General (Internal Medicine)  CHIEF COMPLAINTS/PURPOSE OF VISIT:  Multiple myeloma, for further evaluation  HISTORY OF PRESENTING ILLNESS:  Brian Levine 75 y.o. male was seen today because his primary oncologist is not available The patient have remote history of mild thrombocytopenia Recently, he has significant bleeding He was subsequently found to have multiple myeloma and started treatment with carfilzomib, lenalidomide and dexamethasone 2 weeks ago, he has severe pancytopenia, his blood count has improved He denies recent bleeding He has no new bone pain He is not vigilant taking his calcium supplement He noticed altered taste sensation No recent infection, fever or chills  I reviewed the patient's records extensive and collaborated the history with the patient. Summary of his history is as follows: Oncology History  Multiple myeloma ([place])  01/30/2021 Initial Diagnosis   Multiple myeloma ([place])    02/06/2021 -  Chemotherapy    Patient is on Treatment Plan: MYELOMA NEWLY DIAGNOSED TRANSPLANT CANDIDATE CARFILZOMIB (20/36)/ LENALIDOMIDE / DEXAMETHASONE (40/20) (KRD) Q28D         MEDICAL HISTORY:  Past Medical History:  Diagnosis Date   BPH (benign prostatic hyperplasia) 2004   Constipation    Other pancytopenia ([place]) 05/04/2013   Thrombocytopenia, unspecified ([place]) 08/17/2013   Weight loss 05/04/2013    SURGICAL HISTORY: Past Surgical History:  Procedure Laterality Date   APPENDECTOMY     COLONOSCOPY WITH PROPOFOL N/A 07/17/2015   Procedure: COLONOSCOPY WITH PROPOFOL;  Surgeon: Garlan Fair, MD;  Location: [facility];  Service: Endoscopy;  Laterality: N/A;   CYSTOSCOPY WITH FULGERATION N/A 11/08/2020   Procedure: CYSTOSCOPY WITH FULGERATION OF BLEEDERS;  Surgeon: Irine Seal, MD;  Location: [facility];  Service: Urology;  Laterality: N/A;   CYSTOSCOPY WITH FULGERATION N/A  12/01/2020   Procedure: CYSTOSCOPY WITH FULGERATION OF  PROSTATE;  Surgeon: Irine Seal, MD;  Location: [facility];  Service: Urology;  Laterality: N/A;   CYSTOSCOPY WITH INSERTION OF UROLIFT N/A 08/22/2015   Procedure: CYSTOSCOPY WITH INSERTION OF UROLIFT;  Surgeon: Irine Seal, MD;  Location: [facility];  Service: Urology;  Laterality: N/A;   CYSTOSCOPY WITH LITHOLAPAXY N/A 10/24/2020   Procedure: CYSTOSCOPY WITH LITHOLAPAXY;  Surgeon: Irine Seal, MD;  Location: [facility];  Service: Urology;  Laterality: N/A;  REQUESTING 1 HR FOR CASE   EYE SURGERY     right cataract surgery with lens implant   TESTICLE SURGERY Right 2011   hydrocele   TRANSURETHRAL RESECTION OF PROSTATE N/A 10/24/2020   Procedure: TRANSURETHRAL RESECTION OF THE PROSTATE (TURP);  Surgeon: Irine Seal, MD;  Location: [facility];  Service: Urology;  Laterality: N/A;    SOCIAL HISTORY: Social History   Socioeconomic History   Marital status: Married    Spouse name: Not on file   Number of children: Not on file   Years of education: Not on file   Highest education level: Not on file  Occupational History   Not on file  Tobacco Use   Smoking status: Former    Packs/day: 1.50    Years: 30.00    Pack years: 45.00    Types: Cigarettes    Quit date: 07/30/1995    Years since quitting: 25.6   Smokeless tobacco: Never  Vaping Use   Vaping Use: Never used  Substance and Sexual Activity   Alcohol use: No   Drug use: No   Sexual activity: Not on file  Other Topics Concern  Not on file  Social History Narrative   Not on file   Social Determinants of Health   Financial Resource Strain: Not on file  Food Insecurity: Not on file  Transportation Needs: Not on file  Physical Activity: Not on file  Stress: Not on file  Social Connections: Not on file  Intimate Partner Violence: Not on file    FAMILY HISTORY: History reviewed. No pertinent family history.  ALLERGIES:  has No Known Allergies.  MEDICATIONS:  Current Outpatient  Medications  Medication Sig Dispense Refill   calcium carbonate (TUMS - DOSED IN MG ELEMENTAL CALCIUM) 500 MG chewable tablet Chew 1 tablet by mouth daily.     cholecalciferol (VITAMIN D3) 25 MCG (1000 UNIT) tablet Take 5,000 Units by mouth daily.     acetaminophen (TYLENOL) 325 MG tablet Take 650 mg by mouth every 6 (six) hours as needed (for pain.).     acyclovir (ZOVIRAX) 400 MG tablet Take 1 tablet (400 mg total) by mouth daily. 30 tablet 3   aspirin EC 81 MG tablet Take 1 tablet (81 mg total) by mouth daily. 100 tablet 3   dexamethasone (DECADRON) 4 MG tablet Take 10 tablets (95m) once on day 22. Repeat every 28 days for the first 4 cycles. Take with breakfast. 40 tablet 4   doxazosin (CARDURA) 8 MG tablet Take 8 mg by mouth daily in the afternoon.     finasteride (PROSCAR) 5 MG tablet Take 5 mg by mouth daily in the afternoon.     lenalidomide (REVLIMID) 15 MG capsule Take 1 capsule (15 mg total) by mouth daily. Take for 21 days. Hold 7 days. Repeat every 28 days. 21 capsule 0   lidocaine-prilocaine (EMLA) cream Apply to affected area once 30 g 3   ondansetron (ZOFRAN) 8 MG tablet Take 1 tablet (8 mg total) by mouth 2 (two) times daily as needed (Nausea or vomiting). 30 tablet 1   pravastatin (PRAVACHOL) 40 MG tablet Take 40 mg by mouth daily before supper.     prochlorperazine (COMPAZINE) 10 MG tablet Take 1 tablet (10 mg total) by mouth every 6 (six) hours as needed (Nausea or vomiting). 30 tablet 1   No current facility-administered medications for this visit.   Facility-Administered Medications Ordered in Other Visits  Medication Dose Route Frequency Provider Last Rate Last Admin   0.9 %  sodium chloride infusion   Intravenous Once KIrene Limbo GCloria Spring MD        REVIEW OF SYSTEMS:   Constitutional: Denies fevers, chills or abnormal night sweats Eyes: Denies blurriness of vision, double vision or watery eyes Ears, nose, mouth, throat, and face: Denies mucositis or sore  throat Respiratory: Denies cough, dyspnea or wheezes Cardiovascular: Denies palpitation, chest discomfort or lower extremity swelling Gastrointestinal:  Denies nausea, heartburn or change in bowel habits Skin: Denies abnormal skin rashes Lymphatics: Denies new lymphadenopathy or easy bruising Neurological:Denies numbness, tingling or new weaknesses Behavioral/Psych: Mood is stable, no new changes  All other systems were reviewed with the patient and are negative.  PHYSICAL EXAMINATION: ECOG PERFORMANCE STATUS: 1 - Symptomatic but completely ambulatory  Vitals:   03/06/21 1321  BP: (!) 144/58  Pulse: (!) 59  Resp: 18  Temp: (!) 97.5 F (36.4 C)  SpO2: 100%   Filed Weights   03/06/21 1321  Weight: 183 lb 6.4 oz (83.2 kg)    GENERAL:alert, no distress and comfortable PSYCH: alert & oriented x 3 with fluent speech NEURO: no focal motor/sensory deficits  LABORATORY DATA:  I have reviewed the data as listed Lab Results  Component Value Date   WBC 3.2 (L) 03/06/2021   HGB 11.4 (L) 03/06/2021   HCT 32.7 (L) 03/06/2021   MCV 87.0 03/06/2021   PLT 169 03/06/2021   Recent Labs    02/13/21 1332 02/20/21 1332 03/06/21 1303  NA 139 137 137  K 4.1 4.1 4.1  CL 105 106 107  CO2 _0 GLUCOSE 109* 111* 121*  BUN _1 CREATININE 1.09 1.08 1.06  CALCIUM 9.0 8.5* 8.5*  GFRNONAA >60 >60 >60  PROT 6.1* 5.8* 5.9*  ALBUMIN 3.7 3.4* 3.6  AST 18 46* 16  ALT 23 49* 16  ALKPHOS 59 59 63  BILITOT 1.1 1.1 1.1    ASSESSMENT & PLAN:  Multiple myeloma ([place]) I have ordered myeloma panel today We will call him with test results Once I confirm he has good response to treatment, we can reduce the dose of dexamethasone I reminded him the importance of calcium with vitamin D supplement I also reminded him the importance of acyclovir for antimicrobial prophylaxis I reminded him the importance of aspirin for DVT prophylaxis  Deficiency anemia He has recent pancytopenia likely  due to side effects of treatment His blood counts are improved He is not symptomatic I have order vitamin B12 and iron studies and we will call him with test results  Altered taste He has recent altered taste sensation I am wondering whether it could be due to side effects of his treatment His weight is stable Observe closely for now  No orders of the defined types were placed in this encounter.   All questions were answered. The patient knows to call the clinic with any problems, questions or concerns. The total time spent in the appointment was 30 minutes encounter with patients including review of chart and various tests results, discussions about plan of care and coordination of care plan   Heath Lark, MD 03/06/2021 4:38 PM

## 2021-03-06 NOTE — Assessment & Plan Note (Signed)
He has recent altered taste sensation I am wondering whether it could be due to side effects of his treatment His weight is stable Observe closely for now

## 2021-03-07 ENCOUNTER — Inpatient Hospital Stay: Payer: Medicare HMO

## 2021-03-07 ENCOUNTER — Other Ambulatory Visit: Payer: Medicare HMO

## 2021-03-07 VITALS — BP 115/61 | HR 58 | Temp 97.8°F | Resp 18 | Ht 72.0 in | Wt 186.2 lb

## 2021-03-07 DIAGNOSIS — C9000 Multiple myeloma not having achieved remission: Secondary | ICD-10-CM | POA: Diagnosis not present

## 2021-03-07 DIAGNOSIS — Z7189 Other specified counseling: Secondary | ICD-10-CM

## 2021-03-07 DIAGNOSIS — Z7982 Long term (current) use of aspirin: Secondary | ICD-10-CM | POA: Diagnosis not present

## 2021-03-07 DIAGNOSIS — D539 Nutritional anemia, unspecified: Secondary | ICD-10-CM | POA: Diagnosis not present

## 2021-03-07 DIAGNOSIS — Z5112 Encounter for antineoplastic immunotherapy: Secondary | ICD-10-CM | POA: Diagnosis not present

## 2021-03-07 DIAGNOSIS — Z79899 Other long term (current) drug therapy: Secondary | ICD-10-CM | POA: Diagnosis not present

## 2021-03-07 LAB — KAPPA/LAMBDA LIGHT CHAINS
Kappa free light chain: 16.4 mg/L (ref 3.3–19.4)
Kappa, lambda light chain ratio: 0.14 — ABNORMAL LOW (ref 0.26–1.65)
Lambda free light chains: 114.4 mg/L — ABNORMAL HIGH (ref 5.7–26.3)

## 2021-03-07 MED ORDER — SODIUM CHLORIDE 0.9 % IV SOLN
Freq: Once | INTRAVENOUS | Status: AC
  Filled 2021-03-07: qty 250

## 2021-03-07 MED ORDER — DEXTROSE 5 % IV SOLN
35.0000 mg/m2 | Freq: Once | INTRAVENOUS | Status: AC
  Administered 2021-03-07: 70 mg via INTRAVENOUS
  Filled 2021-03-07: qty 30

## 2021-03-07 MED ORDER — SODIUM CHLORIDE 0.9 % IV SOLN
20.0000 mg | Freq: Once | INTRAVENOUS | Status: AC
  Administered 2021-03-07: 20 mg via INTRAVENOUS
  Filled 2021-03-07: qty 20

## 2021-03-07 MED ORDER — PROCHLORPERAZINE MALEATE 10 MG PO TABS
10.0000 mg | ORAL_TABLET | Freq: Once | ORAL | Status: AC
  Administered 2021-03-07: 10 mg via ORAL
  Filled 2021-03-07: qty 1

## 2021-03-07 NOTE — Patient Instructions (Signed)
[facility]  Discharge Instructions: Thank you for choosing [facility] to provide your oncology and hematology care.   If you have a lab appointment with the Cancer Center, please go directly to the Cancer Center and check in at the registration area.   Wear comfortable clothing and clothing appropriate for easy access to any Portacath or PICC line.   We strive to give you quality time with your provider. You may need to reschedule your appointment if you arrive late (15 or more minutes).  Arriving late affects you and other patients whose appointments are after yours.  Also, if you miss three or more appointments without notifying the office, you may be dismissed from the clinic at the provider's discretion.      For prescription refill requests, have your pharmacy contact our office and allow 72 hours for refills to be completed.    Today you received the following chemotherapy and/or immunotherapy agents: Kyprolis    To help prevent nausea and vomiting after your treatment, we encourage you to take your nausea medication as directed.  BELOW ARE SYMPTOMS THAT SHOULD BE REPORTED IMMEDIATELY: . *FEVER GREATER THAN 100.4 F (38 C) OR HIGHER . *CHILLS OR SWEATING . *NAUSEA AND VOMITING THAT IS NOT CONTROLLED WITH YOUR NAUSEA MEDICATION . *UNUSUAL SHORTNESS OF BREATH . *UNUSUAL BRUISING OR BLEEDING . *URINARY PROBLEMS (pain or burning when urinating, or frequent urination) . *BOWEL PROBLEMS (unusual diarrhea, constipation, pain near the anus) . TENDERNESS IN MOUTH AND THROAT WITH OR WITHOUT PRESENCE OF ULCERS (sore throat, sores in mouth, or a toothache) . UNUSUAL RASH, SWELLING OR PAIN  . UNUSUAL VAGINAL DISCHARGE OR ITCHING   Items with * indicate a potential emergency and should be followed up as soon as possible or go to the Emergency Department if any problems should occur.  Please show the CHEMOTHERAPY ALERT CARD or IMMUNOTHERAPY ALERT  CARD at check-in to the Emergency Department and triage nurse.  Should you have questions after your visit or need to cancel or reschedule your appointment, please contact [facility]  Dept: [phone]  and follow the prompts.  Office hours are 8:00 a.m. to 4:30 p.m. Monday - Friday. Please note that voicemails left after 4:00 p.m. may not be returned until the following business day.  We are closed weekends and major holidays. You have access to a nurse at all times for urgent questions. Please call the main number to the clinic Dept: [phone] and follow the prompts.   For any non-urgent questions, you may also contact your provider using MyChart. We now offer e-Visits for anyone 18 and older to request care online for non-urgent symptoms. For details visit [email or web address].   Also download the MyChart app! Go to the app store, search "MyChart", open the app, select [facility], and log in with your MyChart username and password.  Due to Covid, a mask is required upon entering the hospital/clinic. If you do not have a mask, one will be given to you upon arrival. For doctor visits, patients may have 1 support person aged 18 or older with them. For treatment visits, patients cannot have anyone with them due to current Covid guidelines and our immunocompromised population.   

## 2021-03-10 LAB — MULTIPLE MYELOMA PANEL, SERUM
Albumin SerPl Elph-Mcnc: 3.4 g/dL (ref 2.9–4.4)
Albumin/Glob SerPl: 1.6 (ref 0.7–1.7)
Alpha 1: 0.2 g/dL (ref 0.0–0.4)
Alpha2 Glob SerPl Elph-Mcnc: 0.6 g/dL (ref 0.4–1.0)
B-Globulin SerPl Elph-Mcnc: 0.8 g/dL (ref 0.7–1.3)
Gamma Glob SerPl Elph-Mcnc: 0.5 g/dL (ref 0.4–1.8)
Globulin, Total: 2.2 g/dL (ref 2.2–3.9)
IgA: 99 mg/dL (ref 61–437)
IgG (Immunoglobin G), Serum: 557 mg/dL — ABNORMAL LOW (ref 603–1613)
IgM (Immunoglobulin M), Srm: 44 mg/dL (ref 15–143)
Total Protein ELP: 5.6 g/dL — ABNORMAL LOW (ref 6.0–8.5)

## 2021-03-12 ENCOUNTER — Encounter: Payer: Self-pay | Admitting: Hematology

## 2021-03-12 ENCOUNTER — Telehealth: Payer: Self-pay

## 2021-03-12 NOTE — Telephone Encounter (Signed)
Called and given below message. He verbalized understanding. 

## 2021-03-12 NOTE — Telephone Encounter (Signed)
-----   Message from Heath Lark, MD sent at 03/12/2021  8:46 AM EDT ----- Dr. Irene Limbo patient, tell him all labs are good, light chain went from over 3000 to 114 Continue treatment

## 2021-03-13 ENCOUNTER — Other Ambulatory Visit: Payer: Self-pay

## 2021-03-13 ENCOUNTER — Inpatient Hospital Stay: Payer: Medicare HMO

## 2021-03-13 VITALS — BP 133/60 | HR 60 | Temp 98.3°F | Resp 18 | Ht 72.0 in | Wt 181.4 lb

## 2021-03-13 DIAGNOSIS — Z5112 Encounter for antineoplastic immunotherapy: Secondary | ICD-10-CM | POA: Diagnosis not present

## 2021-03-13 DIAGNOSIS — Z79899 Other long term (current) drug therapy: Secondary | ICD-10-CM | POA: Diagnosis not present

## 2021-03-13 DIAGNOSIS — Z7982 Long term (current) use of aspirin: Secondary | ICD-10-CM | POA: Diagnosis not present

## 2021-03-13 DIAGNOSIS — Z7189 Other specified counseling: Secondary | ICD-10-CM

## 2021-03-13 DIAGNOSIS — D539 Nutritional anemia, unspecified: Secondary | ICD-10-CM | POA: Diagnosis not present

## 2021-03-13 DIAGNOSIS — C9000 Multiple myeloma not having achieved remission: Secondary | ICD-10-CM

## 2021-03-13 LAB — CBC WITH DIFFERENTIAL (CANCER CENTER ONLY)
Abs Immature Granulocytes: 0.02 10*3/uL (ref 0.00–0.07)
Basophils Absolute: 0 10*3/uL (ref 0.0–0.1)
Basophils Relative: 1 %
Eosinophils Absolute: 0.3 10*3/uL (ref 0.0–0.5)
Eosinophils Relative: 9 %
HCT: 32.6 % — ABNORMAL LOW (ref 39.0–52.0)
Hemoglobin: 11.5 g/dL — ABNORMAL LOW (ref 13.0–17.0)
Immature Granulocytes: 1 %
Lymphocytes Relative: 23 %
Lymphs Abs: 0.8 10*3/uL (ref 0.7–4.0)
MCH: 31.1 pg (ref 26.0–34.0)
MCHC: 35.3 g/dL (ref 30.0–36.0)
MCV: 88.1 fL (ref 80.0–100.0)
Monocytes Absolute: 0.2 10*3/uL (ref 0.1–1.0)
Monocytes Relative: 7 %
Neutro Abs: 2 10*3/uL (ref 1.7–7.7)
Neutrophils Relative %: 59 %
Platelet Count: 101 10*3/uL — ABNORMAL LOW (ref 150–400)
RBC: 3.7 MIL/uL — ABNORMAL LOW (ref 4.22–5.81)
RDW: 13.7 % (ref 11.5–15.5)
WBC Count: 3.4 10*3/uL — ABNORMAL LOW (ref 4.0–10.5)
nRBC: 0 % (ref 0.0–0.2)

## 2021-03-13 LAB — CMP (CANCER CENTER ONLY)
ALT: 17 U/L (ref 0–44)
AST: 14 U/L — ABNORMAL LOW (ref 15–41)
Albumin: 3.6 g/dL (ref 3.5–5.0)
Alkaline Phosphatase: 55 U/L (ref 38–126)
Anion gap: 7 (ref 5–15)
BUN: 11 mg/dL (ref 8–23)
CO2: 24 mmol/L (ref 22–32)
Calcium: 8.5 mg/dL — ABNORMAL LOW (ref 8.9–10.3)
Chloride: 106 mmol/L (ref 98–111)
Creatinine: 1.18 mg/dL (ref 0.61–1.24)
GFR, Estimated: >60 mL/min (ref >60)
Glucose, Bld: 119 mg/dL — ABNORMAL HIGH (ref 70–99)
Potassium: 4.2 mmol/L (ref 3.5–5.1)
Sodium: 137 mmol/L (ref 135–145)
Total Bilirubin: 1.5 mg/dL — ABNORMAL HIGH (ref 0.3–1.2)
Total Protein: 5.8 g/dL — ABNORMAL LOW (ref 6.5–8.1)

## 2021-03-13 MED ORDER — SODIUM CHLORIDE 0.9 % IV SOLN: Freq: Once | INTRAVENOUS | Status: AC

## 2021-03-13 MED ORDER — SODIUM CHLORIDE 0.9 % IV SOLN: Freq: Once | INTRAVENOUS | Status: DC

## 2021-03-13 MED ORDER — ZOLEDRONIC ACID 4 MG/100ML IV SOLN
4.0000 mg | Freq: Once | INTRAVENOUS | Status: AC
  Administered 2021-03-13: 4 mg via INTRAVENOUS
  Filled 2021-03-13: qty 100

## 2021-03-13 MED ORDER — DEXTROSE 5 % IV SOLN
35.0000 mg/m2 | Freq: Once | INTRAVENOUS | Status: AC
  Administered 2021-03-13: 70 mg via INTRAVENOUS
  Filled 2021-03-13: qty 30

## 2021-03-13 MED ORDER — SODIUM CHLORIDE 0.9 % IV SOLN
20.0000 mg | Freq: Once | INTRAVENOUS | Status: AC
  Administered 2021-03-13: 20 mg via INTRAVENOUS
  Filled 2021-03-13: qty 20

## 2021-03-13 NOTE — Patient Instructions (Signed)
[facility]  Discharge Instructions: Thank you for choosing [facility] to provide your oncology and hematology care.   If you have a lab appointment with the Cancer Center, please go directly to the Cancer Center and check in at the registration area.   Wear comfortable clothing and clothing appropriate for easy access to any Portacath or PICC line.   We strive to give you quality time with your provider. You may need to reschedule your appointment if you arrive late (15 or more minutes).  Arriving late affects you and other patients whose appointments are after yours.  Also, if you miss three or more appointments without notifying the office, you may be dismissed from the clinic at the provider's discretion.      For prescription refill requests, have your pharmacy contact our office and allow 72 hours for refills to be completed.    Today you received the following chemotherapy and/or immunotherapy agents kyprolis      To help prevent nausea and vomiting after your treatment, we encourage you to take your nausea medication as directed.  BELOW ARE SYMPTOMS THAT SHOULD BE REPORTED IMMEDIATELY: . *FEVER GREATER THAN 100.4 F (38 C) OR HIGHER . *CHILLS OR SWEATING . *NAUSEA AND VOMITING THAT IS NOT CONTROLLED WITH YOUR NAUSEA MEDICATION . *UNUSUAL SHORTNESS OF BREATH . *UNUSUAL BRUISING OR BLEEDING . *URINARY PROBLEMS (pain or burning when urinating, or frequent urination) . *BOWEL PROBLEMS (unusual diarrhea, constipation, pain near the anus) . TENDERNESS IN MOUTH AND THROAT WITH OR WITHOUT PRESENCE OF ULCERS (sore throat, sores in mouth, or a toothache) . UNUSUAL RASH, SWELLING OR PAIN  . UNUSUAL VAGINAL DISCHARGE OR ITCHING   Items with * indicate a potential emergency and should be followed up as soon as possible or go to the Emergency Department if any problems should occur.  Please show the CHEMOTHERAPY ALERT CARD or IMMUNOTHERAPY ALERT  CARD at check-in to the Emergency Department and triage nurse.  Should you have questions after your visit or need to cancel or reschedule your appointment, please contact [facility]  Dept: [phone]  and follow the prompts.  Office hours are 8:00 a.m. to 4:30 p.m. Monday - Friday. Please note that voicemails left after 4:00 p.m. may not be returned until the following business day.  We are closed weekends and major holidays. You have access to a nurse at all times for urgent questions. Please call the main number to the clinic Dept: [phone] and follow the prompts.   For any non-urgent questions, you may also contact your provider using MyChart. We now offer e-Visits for anyone 18 and older to request care online for non-urgent symptoms. For details visit [email or web address].   Also download the MyChart app! Go to the app store, search "MyChart", open the app, select [facility], and log in with your MyChart username and password.  Due to Covid, a mask is required upon entering the hospital/clinic. If you do not have a mask, one will be given to you upon arrival. For doctor visits, patients may have 1 support person aged 18 or older with them. For treatment visits, patients cannot have anyone with them due to current Covid guidelines and our immunocompromised population.   

## 2021-03-14 ENCOUNTER — Inpatient Hospital Stay: Payer: Medicare HMO

## 2021-03-14 ENCOUNTER — Other Ambulatory Visit: Payer: Medicare HMO

## 2021-03-14 VITALS — BP 138/80 | HR 59 | Temp 97.9°F | Resp 18

## 2021-03-14 DIAGNOSIS — Z5112 Encounter for antineoplastic immunotherapy: Secondary | ICD-10-CM | POA: Diagnosis not present

## 2021-03-14 DIAGNOSIS — Z7982 Long term (current) use of aspirin: Secondary | ICD-10-CM | POA: Diagnosis not present

## 2021-03-14 DIAGNOSIS — C9000 Multiple myeloma not having achieved remission: Secondary | ICD-10-CM

## 2021-03-14 DIAGNOSIS — Z79899 Other long term (current) drug therapy: Secondary | ICD-10-CM | POA: Diagnosis not present

## 2021-03-14 DIAGNOSIS — D539 Nutritional anemia, unspecified: Secondary | ICD-10-CM | POA: Diagnosis not present

## 2021-03-14 DIAGNOSIS — Z7189 Other specified counseling: Secondary | ICD-10-CM

## 2021-03-14 MED ORDER — PROCHLORPERAZINE MALEATE 10 MG PO TABS
10.0000 mg | ORAL_TABLET | Freq: Once | ORAL | Status: AC
  Administered 2021-03-14: 10 mg via ORAL
  Filled 2021-03-14: qty 1

## 2021-03-14 MED ORDER — SODIUM CHLORIDE 0.9 % IV SOLN
20.0000 mg | Freq: Once | INTRAVENOUS | Status: AC
  Administered 2021-03-14: 20 mg via INTRAVENOUS
  Filled 2021-03-14: qty 20

## 2021-03-14 MED ORDER — SODIUM CHLORIDE 0.9 % IV SOLN: Freq: Once | INTRAVENOUS | Status: AC

## 2021-03-14 MED ORDER — DEXTROSE 5 % IV SOLN
35.0000 mg/m2 | Freq: Once | INTRAVENOUS | Status: AC
  Administered 2021-03-14: 70 mg via INTRAVENOUS
  Filled 2021-03-14: qty 30

## 2021-03-14 NOTE — Patient Instructions (Signed)
[facility]  Discharge Instructions: Thank you for choosing [facility] to provide your oncology and hematology care.   If you have a lab appointment with the Cancer Center, please go directly to the Cancer Center and check in at the registration area.   Wear comfortable clothing and clothing appropriate for easy access to any Portacath or PICC line.   We strive to give you quality time with your provider. You may need to reschedule your appointment if you arrive late (15 or more minutes).  Arriving late affects you and other patients whose appointments are after yours.  Also, if you miss three or more appointments without notifying the office, you may be dismissed from the clinic at the provider's discretion.      For prescription refill requests, have your pharmacy contact our office and allow 72 hours for refills to be completed.    Today you received the following chemotherapy and/or immunotherapy agents carfilzomib   To help prevent nausea and vomiting after your treatment, we encourage you to take your nausea medication as directed.  BELOW ARE SYMPTOMS THAT SHOULD BE REPORTED IMMEDIATELY: . *FEVER GREATER THAN 100.4 F (38 C) OR HIGHER . *CHILLS OR SWEATING . *NAUSEA AND VOMITING THAT IS NOT CONTROLLED WITH YOUR NAUSEA MEDICATION . *UNUSUAL SHORTNESS OF BREATH . *UNUSUAL BRUISING OR BLEEDING . *URINARY PROBLEMS (pain or burning when urinating, or frequent urination) . *BOWEL PROBLEMS (unusual diarrhea, constipation, pain near the anus) . TENDERNESS IN MOUTH AND THROAT WITH OR WITHOUT PRESENCE OF ULCERS (sore throat, sores in mouth, or a toothache) . UNUSUAL RASH, SWELLING OR PAIN  . UNUSUAL VAGINAL DISCHARGE OR ITCHING   Items with * indicate a potential emergency and should be followed up as soon as possible or go to the Emergency Department if any problems should occur.  Please show the CHEMOTHERAPY ALERT CARD or IMMUNOTHERAPY ALERT  CARD at check-in to the Emergency Department and triage nurse.  Should you have questions after your visit or need to cancel or reschedule your appointment, please contact [facility]  Dept: [phone]  and follow the prompts.  Office hours are 8:00 a.m. to 4:30 p.m. Monday - Friday. Please note that voicemails left after 4:00 p.m. may not be returned until the following business day.  We are closed weekends and major holidays. You have access to a nurse at all times for urgent questions. Please call the main number to the clinic Dept: [phone] and follow the prompts.   For any non-urgent questions, you may also contact your provider using MyChart. We now offer e-Visits for anyone 18 and older to request care online for non-urgent symptoms. For details visit [email or web address].   Also download the MyChart app! Go to the app store, search "MyChart", open the app, select [facility], and log in with your MyChart username and password.  Due to Covid, a mask is required upon entering the hospital/clinic. If you do not have a mask, one will be given to you upon arrival. For doctor visits, patients may have 1 support person aged 18 or older with them. For treatment visits, patients cannot have anyone with them due to current Covid guidelines and our immunocompromised population.   

## 2021-03-18 ENCOUNTER — Other Ambulatory Visit: Payer: Self-pay | Admitting: Hematology

## 2021-03-18 DIAGNOSIS — C9000 Multiple myeloma not having achieved remission: Secondary | ICD-10-CM

## 2021-03-18 DIAGNOSIS — Z7189 Other specified counseling: Secondary | ICD-10-CM

## 2021-03-20 ENCOUNTER — Encounter: Payer: Self-pay | Admitting: Hematology

## 2021-03-20 ENCOUNTER — Inpatient Hospital Stay: Payer: Medicare HMO

## 2021-03-20 ENCOUNTER — Other Ambulatory Visit: Payer: Self-pay

## 2021-03-20 VITALS — BP 128/59 | HR 55 | Temp 98.2°F | Resp 18 | Wt 180.5 lb

## 2021-03-20 DIAGNOSIS — Z79899 Other long term (current) drug therapy: Secondary | ICD-10-CM | POA: Diagnosis not present

## 2021-03-20 DIAGNOSIS — C9000 Multiple myeloma not having achieved remission: Secondary | ICD-10-CM

## 2021-03-20 DIAGNOSIS — Z5112 Encounter for antineoplastic immunotherapy: Secondary | ICD-10-CM | POA: Diagnosis not present

## 2021-03-20 DIAGNOSIS — Z7982 Long term (current) use of aspirin: Secondary | ICD-10-CM | POA: Diagnosis not present

## 2021-03-20 DIAGNOSIS — D539 Nutritional anemia, unspecified: Secondary | ICD-10-CM | POA: Diagnosis not present

## 2021-03-20 DIAGNOSIS — Z7189 Other specified counseling: Secondary | ICD-10-CM

## 2021-03-20 LAB — CBC WITH DIFFERENTIAL (CANCER CENTER ONLY)
Abs Immature Granulocytes: 0.01 10*3/uL (ref 0.00–0.07)
Basophils Absolute: 0 10*3/uL (ref 0.0–0.1)
Basophils Relative: 1 %
Eosinophils Absolute: 0.6 10*3/uL — ABNORMAL HIGH (ref 0.0–0.5)
Eosinophils Relative: 16 %
HCT: 30.4 % — ABNORMAL LOW (ref 39.0–52.0)
Hemoglobin: 10.4 g/dL — ABNORMAL LOW (ref 13.0–17.0)
Immature Granulocytes: 0 %
Lymphocytes Relative: 20 %
Lymphs Abs: 0.7 10*3/uL (ref 0.7–4.0)
MCH: 30.4 pg (ref 26.0–34.0)
MCHC: 34.2 g/dL (ref 30.0–36.0)
MCV: 88.9 fL (ref 80.0–100.0)
Monocytes Absolute: 0.6 10*3/uL (ref 0.1–1.0)
Monocytes Relative: 17 %
Neutro Abs: 1.7 10*3/uL (ref 1.7–7.7)
Neutrophils Relative %: 46 %
Platelet Count: 87 10*3/uL — ABNORMAL LOW (ref 150–400)
RBC: 3.42 MIL/uL — ABNORMAL LOW (ref 4.22–5.81)
RDW: 13.7 % (ref 11.5–15.5)
WBC Count: 3.6 10*3/uL — ABNORMAL LOW (ref 4.0–10.5)
nRBC: 0 % (ref 0.0–0.2)

## 2021-03-20 LAB — CMP (CANCER CENTER ONLY)
ALT: 20 U/L (ref 0–44)
AST: 15 U/L (ref 15–41)
Albumin: 3.5 g/dL (ref 3.5–5.0)
Alkaline Phosphatase: 57 U/L (ref 38–126)
Anion gap: 8 (ref 5–15)
BUN: 12 mg/dL (ref 8–23)
CO2: 24 mmol/L (ref 22–32)
Calcium: 8.4 mg/dL — ABNORMAL LOW (ref 8.9–10.3)
Chloride: 106 mmol/L (ref 98–111)
Creatinine: 1.08 mg/dL (ref 0.61–1.24)
GFR, Estimated: >60 mL/min (ref >60)
Glucose, Bld: 100 mg/dL — ABNORMAL HIGH (ref 70–99)
Potassium: 4.3 mmol/L (ref 3.5–5.1)
Sodium: 138 mmol/L (ref 135–145)
Total Bilirubin: 1.5 mg/dL — ABNORMAL HIGH (ref 0.3–1.2)
Total Protein: 5.5 g/dL — ABNORMAL LOW (ref 6.5–8.1)

## 2021-03-20 MED ORDER — DEXTROSE 5 % IV SOLN
35.0000 mg/m2 | Freq: Once | INTRAVENOUS | Status: AC
  Administered 2021-03-20: 70 mg via INTRAVENOUS
  Filled 2021-03-20: qty 5

## 2021-03-20 MED ORDER — SODIUM CHLORIDE 0.9 % IV SOLN: Freq: Once | INTRAVENOUS | Status: AC

## 2021-03-20 MED ORDER — SODIUM CHLORIDE 0.9 % IV SOLN
20.0000 mg | Freq: Once | INTRAVENOUS | Status: AC
  Administered 2021-03-20: 20 mg via INTRAVENOUS
  Filled 2021-03-20: qty 20

## 2021-03-20 NOTE — Patient Instructions (Signed)
[facility]  Discharge Instructions: Thank you for choosing [facility] to provide your oncology and hematology care.   If you have a lab appointment with the Cancer Center, please go directly to the Cancer Center and check in at the registration area.   Wear comfortable clothing and clothing appropriate for easy access to any Portacath or PICC line.   We strive to give you quality time with your provider. You may need to reschedule your appointment if you arrive late (15 or more minutes).  Arriving late affects you and other patients whose appointments are after yours.  Also, if you miss three or more appointments without notifying the office, you may be dismissed from the clinic at the provider's discretion.      For prescription refill requests, have your pharmacy contact our office and allow 72 hours for refills to be completed.    Today you received the following chemotherapy and/or immunotherapy agents: Kyprolis    To help prevent nausea and vomiting after your treatment, we encourage you to take your nausea medication as directed.  BELOW ARE SYMPTOMS THAT SHOULD BE REPORTED IMMEDIATELY: . *FEVER GREATER THAN 100.4 F (38 C) OR HIGHER . *CHILLS OR SWEATING . *NAUSEA AND VOMITING THAT IS NOT CONTROLLED WITH YOUR NAUSEA MEDICATION . *UNUSUAL SHORTNESS OF BREATH . *UNUSUAL BRUISING OR BLEEDING . *URINARY PROBLEMS (pain or burning when urinating, or frequent urination) . *BOWEL PROBLEMS (unusual diarrhea, constipation, pain near the anus) . TENDERNESS IN MOUTH AND THROAT WITH OR WITHOUT PRESENCE OF ULCERS (sore throat, sores in mouth, or a toothache) . UNUSUAL RASH, SWELLING OR PAIN  . UNUSUAL VAGINAL DISCHARGE OR ITCHING   Items with * indicate a potential emergency and should be followed up as soon as possible or go to the Emergency Department if any problems should occur.  Please show the CHEMOTHERAPY ALERT CARD or IMMUNOTHERAPY ALERT  CARD at check-in to the Emergency Department and triage nurse.  Should you have questions after your visit or need to cancel or reschedule your appointment, please contact [facility]  Dept: [phone]  and follow the prompts.  Office hours are 8:00 a.m. to 4:30 p.m. Monday - Friday. Please note that voicemails left after 4:00 p.m. may not be returned until the following business day.  We are closed weekends and major holidays. You have access to a nurse at all times for urgent questions. Please call the main number to the clinic Dept: [phone] and follow the prompts.   For any non-urgent questions, you may also contact your provider using MyChart. We now offer e-Visits for anyone 18 and older to request care online for non-urgent symptoms. For details visit [email or web address].   Also download the MyChart app! Go to the app store, search "MyChart", open the app, select [facility], and log in with your MyChart username and password.  Due to Covid, a mask is required upon entering the hospital/clinic. If you do not have a mask, one will be given to you upon arrival. For doctor visits, patients may have 1 support person aged 18 or older with them. For treatment visits, patients cannot have anyone with them due to current Covid guidelines and our immunocompromised population.   

## 2021-03-20 NOTE — Progress Notes (Signed)
Per Dr Irene Limbo pt ok to tx with Plts of 87.

## 2021-03-21 ENCOUNTER — Other Ambulatory Visit: Payer: Self-pay

## 2021-03-21 ENCOUNTER — Other Ambulatory Visit: Payer: Medicare HMO

## 2021-03-21 ENCOUNTER — Inpatient Hospital Stay: Payer: Medicare HMO

## 2021-03-21 VITALS — BP 121/59 | HR 67 | Temp 98.1°F | Resp 16

## 2021-03-21 DIAGNOSIS — Z5112 Encounter for antineoplastic immunotherapy: Secondary | ICD-10-CM | POA: Diagnosis not present

## 2021-03-21 DIAGNOSIS — C9000 Multiple myeloma not having achieved remission: Secondary | ICD-10-CM

## 2021-03-21 DIAGNOSIS — Z79899 Other long term (current) drug therapy: Secondary | ICD-10-CM | POA: Diagnosis not present

## 2021-03-21 DIAGNOSIS — D539 Nutritional anemia, unspecified: Secondary | ICD-10-CM | POA: Diagnosis not present

## 2021-03-21 DIAGNOSIS — Z7982 Long term (current) use of aspirin: Secondary | ICD-10-CM | POA: Diagnosis not present

## 2021-03-21 DIAGNOSIS — Z7189 Other specified counseling: Secondary | ICD-10-CM

## 2021-03-21 MED ORDER — SODIUM CHLORIDE 0.9 % IV SOLN
20.0000 mg | Freq: Once | INTRAVENOUS | Status: AC
  Administered 2021-03-21: 20 mg via INTRAVENOUS
  Filled 2021-03-21: qty 20

## 2021-03-21 MED ORDER — SODIUM CHLORIDE 0.9 % IV SOLN: Freq: Once | INTRAVENOUS | Status: AC

## 2021-03-21 MED ORDER — DEXTROSE 5 % IV SOLN
35.0000 mg/m2 | Freq: Once | INTRAVENOUS | Status: AC
  Administered 2021-03-21: 70 mg via INTRAVENOUS
  Filled 2021-03-21: qty 30

## 2021-03-21 MED ORDER — PROCHLORPERAZINE MALEATE 10 MG PO TABS
10.0000 mg | ORAL_TABLET | Freq: Once | ORAL | Status: AC
  Administered 2021-03-21: 10 mg via ORAL
  Filled 2021-03-21: qty 1

## 2021-03-21 NOTE — Patient Instructions (Signed)
[facility]  Discharge Instructions: Thank you for choosing [facility] to provide your oncology and hematology care.   If you have a lab appointment with the Cancer Center, please go directly to the Cancer Center and check in at the registration area.   Wear comfortable clothing and clothing appropriate for easy access to any Portacath or PICC line.   We strive to give you quality time with your provider. You may need to reschedule your appointment if you arrive late (15 or more minutes).  Arriving late affects you and other patients whose appointments are after yours.  Also, if you miss three or more appointments without notifying the office, you may be dismissed from the clinic at the provider's discretion.      For prescription refill requests, have your pharmacy contact our office and allow 72 hours for refills to be completed.    Today you received the following chemotherapy and/or immunotherapy agents: Kyprolis    To help prevent nausea and vomiting after your treatment, we encourage you to take your nausea medication as directed.  BELOW ARE SYMPTOMS THAT SHOULD BE REPORTED IMMEDIATELY: . *FEVER GREATER THAN 100.4 F (38 C) OR HIGHER . *CHILLS OR SWEATING . *NAUSEA AND VOMITING THAT IS NOT CONTROLLED WITH YOUR NAUSEA MEDICATION . *UNUSUAL SHORTNESS OF BREATH . *UNUSUAL BRUISING OR BLEEDING . *URINARY PROBLEMS (pain or burning when urinating, or frequent urination) . *BOWEL PROBLEMS (unusual diarrhea, constipation, pain near the anus) . TENDERNESS IN MOUTH AND THROAT WITH OR WITHOUT PRESENCE OF ULCERS (sore throat, sores in mouth, or a toothache) . UNUSUAL RASH, SWELLING OR PAIN  . UNUSUAL VAGINAL DISCHARGE OR ITCHING   Items with * indicate a potential emergency and should be followed up as soon as possible or go to the Emergency Department if any problems should occur.  Please show the CHEMOTHERAPY ALERT CARD or IMMUNOTHERAPY ALERT  CARD at check-in to the Emergency Department and triage nurse.  Should you have questions after your visit or need to cancel or reschedule your appointment, please contact [facility]  Dept: [phone]  and follow the prompts.  Office hours are 8:00 a.m. to 4:30 p.m. Monday - Friday. Please note that voicemails left after 4:00 p.m. may not be returned until the following business day.  We are closed weekends and major holidays. You have access to a nurse at all times for urgent questions. Please call the main number to the clinic Dept: [phone] and follow the prompts.   For any non-urgent questions, you may also contact your provider using MyChart. We now offer e-Visits for anyone 18 and older to request care online for non-urgent symptoms. For details visit [email or web address].   Also download the MyChart app! Go to the app store, search "MyChart", open the app, select [facility], and log in with your MyChart username and password.  Due to Covid, a mask is required upon entering the hospital/clinic. If you do not have a mask, one will be given to you upon arrival. For doctor visits, patients may have 1 support person aged 18 or older with them. For treatment visits, patients cannot have anyone with them due to current Covid guidelines and our immunocompromised population.   

## 2021-03-23 ENCOUNTER — Other Ambulatory Visit: Payer: Self-pay

## 2021-03-23 DIAGNOSIS — C9000 Multiple myeloma not having achieved remission: Secondary | ICD-10-CM

## 2021-03-23 DIAGNOSIS — Z7189 Other specified counseling: Secondary | ICD-10-CM

## 2021-03-23 MED ORDER — LENALIDOMIDE 15 MG PO CAPS: ORAL_CAPSULE | ORAL | 0 refills | Status: DC

## 2021-03-30 ENCOUNTER — Other Ambulatory Visit: Payer: Self-pay

## 2021-03-30 DIAGNOSIS — C9000 Multiple myeloma not having achieved remission: Secondary | ICD-10-CM

## 2021-04-03 ENCOUNTER — Inpatient Hospital Stay: Payer: Medicare HMO

## 2021-04-03 ENCOUNTER — Other Ambulatory Visit: Payer: Self-pay

## 2021-04-03 ENCOUNTER — Inpatient Hospital Stay (HOSPITAL_BASED_OUTPATIENT_CLINIC_OR_DEPARTMENT_OTHER): Payer: Medicare HMO | Admitting: Hematology

## 2021-04-03 ENCOUNTER — Inpatient Hospital Stay: Payer: Medicare HMO | Attending: Hematology

## 2021-04-03 VITALS — BP 133/59 | HR 58 | Temp 98.0°F | Resp 17 | Ht 72.0 in | Wt 181.6 lb

## 2021-04-03 DIAGNOSIS — C9000 Multiple myeloma not having achieved remission: Secondary | ICD-10-CM

## 2021-04-03 DIAGNOSIS — Z79899 Other long term (current) drug therapy: Secondary | ICD-10-CM | POA: Insufficient documentation

## 2021-04-03 DIAGNOSIS — D649 Anemia, unspecified: Secondary | ICD-10-CM | POA: Diagnosis not present

## 2021-04-03 DIAGNOSIS — Z5112 Encounter for antineoplastic immunotherapy: Secondary | ICD-10-CM | POA: Insufficient documentation

## 2021-04-03 DIAGNOSIS — Z87891 Personal history of nicotine dependence: Secondary | ICD-10-CM | POA: Insufficient documentation

## 2021-04-03 DIAGNOSIS — Z7189 Other specified counseling: Secondary | ICD-10-CM

## 2021-04-03 DIAGNOSIS — N400 Enlarged prostate without lower urinary tract symptoms: Secondary | ICD-10-CM | POA: Diagnosis not present

## 2021-04-03 DIAGNOSIS — Z7982 Long term (current) use of aspirin: Secondary | ICD-10-CM | POA: Diagnosis not present

## 2021-04-03 LAB — CBC WITH DIFFERENTIAL (CANCER CENTER ONLY)
Abs Immature Granulocytes: 0.01 K/uL (ref 0.00–0.07)
Basophils Absolute: 0.1 K/uL (ref 0.0–0.1)
Basophils Relative: 1 %
Eosinophils Absolute: 0.2 K/uL (ref 0.0–0.5)
Eosinophils Relative: 5 %
HCT: 30.8 % — ABNORMAL LOW (ref 39.0–52.0)
Hemoglobin: 10.4 g/dL — ABNORMAL LOW (ref 13.0–17.0)
Immature Granulocytes: 0 %
Lymphocytes Relative: 26 %
Lymphs Abs: 1 K/uL (ref 0.7–4.0)
MCH: 30.5 pg (ref 26.0–34.0)
MCHC: 33.8 g/dL (ref 30.0–36.0)
MCV: 90.3 fL (ref 80.0–100.0)
Monocytes Absolute: 0.6 K/uL (ref 0.1–1.0)
Monocytes Relative: 16 %
Neutro Abs: 1.9 K/uL (ref 1.7–7.7)
Neutrophils Relative %: 52 %
Platelet Count: 119 K/uL — ABNORMAL LOW (ref 150–400)
RBC: 3.41 MIL/uL — ABNORMAL LOW (ref 4.22–5.81)
RDW: 14.5 % (ref 11.5–15.5)
WBC Count: 3.8 K/uL — ABNORMAL LOW (ref 4.0–10.5)
nRBC: 0 % (ref 0.0–0.2)

## 2021-04-03 LAB — CMP (CANCER CENTER ONLY)
ALT: 17 U/L (ref 0–44)
AST: 15 U/L (ref 15–41)
Albumin: 3.6 g/dL (ref 3.5–5.0)
Alkaline Phosphatase: 52 U/L (ref 38–126)
Anion gap: 9 (ref 5–15)
BUN: 12 mg/dL (ref 8–23)
CO2: 23 mmol/L (ref 22–32)
Calcium: 8.6 mg/dL — ABNORMAL LOW (ref 8.9–10.3)
Chloride: 105 mmol/L (ref 98–111)
Creatinine: 1.05 mg/dL (ref 0.61–1.24)
GFR, Estimated: >60 mL/min (ref >60)
Glucose, Bld: 103 mg/dL — ABNORMAL HIGH (ref 70–99)
Potassium: 4.2 mmol/L (ref 3.5–5.1)
Sodium: 137 mmol/L (ref 135–145)
Total Bilirubin: 1.2 mg/dL (ref 0.3–1.2)
Total Protein: 5.7 g/dL — ABNORMAL LOW (ref 6.5–8.1)

## 2021-04-03 MED ORDER — DEXTROSE 5 % IV SOLN: 36.0000 mg/m2 | Freq: Once | INTRAVENOUS | Status: DC

## 2021-04-03 MED ORDER — SODIUM CHLORIDE 0.9 % IV SOLN
20.0000 mg | Freq: Once | INTRAVENOUS | Status: AC
  Administered 2021-04-03: 20 mg via INTRAVENOUS
  Filled 2021-04-03: qty 20

## 2021-04-03 MED ORDER — SODIUM CHLORIDE 0.9 % IV SOLN: Freq: Once | INTRAVENOUS | Status: AC

## 2021-04-03 MED ORDER — DEXTROSE 5 % IV SOLN
70.0000 mg | Freq: Once | INTRAVENOUS | Status: AC
  Administered 2021-04-03: 70 mg via INTRAVENOUS
  Filled 2021-04-03: qty 30

## 2021-04-03 NOTE — Patient Instructions (Signed)
[facility]COL[facility]  Discharge Instructions: Thank you for choosing [facility] to provide your oncology and hematology care.   If you have a lab appointment with the Cancer Center, please go directly to the Cancer Center and check in at the registration area.   Wear comfortable clothing and clothing appropriate for easy access to any Portacath or PICC line.   We strive to give you quality time with your provider. You may need to reschedule your appointment if you arrive late (15 or more minutes).  Arriving late affects you and other patients whose appointments are after yours.  Also, if you miss three or more appointments without notifying the office, you may be dismissed from the clinic at the provider's discretion.      For prescription refill requests, have your pharmacy contact our office and allow 72 hours for refills to be completed.    Today you received the following chemotherapy and/or immunotherapy agents: Kyprolis    To help prevent nausea and vomiting after your treatment, we encourage you to take your nausea medication as directed.  BELOW ARE SYMPTOMS THAT SHOULD BE REPORTED IMMEDIATELY: . *FEVER GREATER THAN 100.4 F (38 C) OR HIGHER . *CHILLS OR SWEATING . *NAUSEA AND VOMITING THAT IS NOT CONTROLLED WITH YOUR NAUSEA MEDICATION . *UNUSUAL SHORTNESS OF BREATH . *UNUSUAL BRUISING OR BLEEDING . *URINARY PROBLEMS (pain or burning when urinating, or frequent urination) . *BOWEL PROBLEMS (unusual diarrhea, constipation, pain near the anus) . TENDERNESS IN MOUTH AND THROAT WITH OR WITHOUT PRESENCE OF ULCERS (sore throat, sores in mouth, or a toothache) . UNUSUAL RASH, SWELLING OR PAIN  . UNUSUAL VAGINAL DISCHARGE OR ITCHING   Items with * indicate a potential emergency and should be followed up as soon as possible or go to the Emergency Department if any problems should occur.  Please show the CHEMOTHERAPY ALERT CARD or IMMUNOTHERAPY ALERT  CARD at check-in to the Emergency Department and triage nurse.  Should you have questions after your visit or need to cancel or reschedule your appointment, please contact [facility]  Dept: [phone]  and follow the prompts.  Office hours are 8:00 a.m. to 4:30 p.m. Monday - Friday. Please note that voicemails left after 4:00 p.m. may not be returned until the following business day.  We are closed weekends and major holidays. You have access to a nurse at all times for urgent questions. Please call the main number to the clinic Dept: [phone] and follow the prompts.   For any non-urgent questions, you may also contact your provider using MyChart. We now offer e-Visits for anyone 18 and older to request care online for non-urgent symptoms. For details visit [email or web address].   Also download the MyChart app! Go to the app store, search "MyChart", open the app, select [facility], and log in with your MyChart username and password.  Due to Covid, a mask is required upon entering the hospital/clinic. If you do not have a mask, one will be given to you upon arrival. For doctor visits, patients may have 1 support person aged 18 or older with them. For treatment visits, patients cannot have anyone with them due to current Covid guidelines and our immunocompromised population.   

## 2021-04-04 ENCOUNTER — Inpatient Hospital Stay: Payer: Medicare HMO

## 2021-04-04 VITALS — BP 136/61 | HR 56 | Temp 97.9°F | Resp 20

## 2021-04-04 DIAGNOSIS — Z7982 Long term (current) use of aspirin: Secondary | ICD-10-CM | POA: Diagnosis not present

## 2021-04-04 DIAGNOSIS — Z79899 Other long term (current) drug therapy: Secondary | ICD-10-CM | POA: Diagnosis not present

## 2021-04-04 DIAGNOSIS — C9000 Multiple myeloma not having achieved remission: Secondary | ICD-10-CM | POA: Diagnosis not present

## 2021-04-04 DIAGNOSIS — D649 Anemia, unspecified: Secondary | ICD-10-CM | POA: Diagnosis not present

## 2021-04-04 DIAGNOSIS — Z5112 Encounter for antineoplastic immunotherapy: Secondary | ICD-10-CM | POA: Diagnosis not present

## 2021-04-04 DIAGNOSIS — Z87891 Personal history of nicotine dependence: Secondary | ICD-10-CM | POA: Diagnosis not present

## 2021-04-04 DIAGNOSIS — N400 Enlarged prostate without lower urinary tract symptoms: Secondary | ICD-10-CM | POA: Diagnosis not present

## 2021-04-04 DIAGNOSIS — Z7189 Other specified counseling: Secondary | ICD-10-CM

## 2021-04-04 MED ORDER — SODIUM CHLORIDE 0.9 % IV SOLN: Freq: Once | INTRAVENOUS | Status: AC

## 2021-04-04 MED ORDER — SODIUM CHLORIDE 0.9 % IV SOLN
20.0000 mg | Freq: Once | INTRAVENOUS | Status: AC
  Administered 2021-04-04: 20 mg via INTRAVENOUS
  Filled 2021-04-04: qty 20

## 2021-04-04 MED ORDER — PROCHLORPERAZINE MALEATE 10 MG PO TABS
10.0000 mg | ORAL_TABLET | Freq: Once | ORAL | Status: AC
  Administered 2021-04-04: 10 mg via ORAL
  Filled 2021-04-04: qty 1

## 2021-04-04 MED ORDER — DEXTROSE 5 % IV SOLN
35.0000 mg/m2 | Freq: Once | INTRAVENOUS | Status: AC
  Administered 2021-04-04: 70 mg via INTRAVENOUS
  Filled 2021-04-04: qty 30

## 2021-04-04 NOTE — Patient Instructions (Signed)
[facility] ONCOLOG[facility]  Discharge Instructions: Thank you for choosing [facility] to provide your oncology and hematology care.   If you have a lab appointment with the [facility], please go directly to the [facility] and check in at the registration area.   Wear comfortable clothing and clothing appropriate for easy access to any Portacath or PICC line.   We strive to give you quality time with your provider. You may need to reschedule your appointment if you arrive late (15 or more minutes).  Arriving late affects you and other patients whose appointments are after yours.  Also, if you miss three or more appointments without notifying the office, you may be dismissed from the clinic at the provider's discretion.      For prescription refill requests, have your pharmacy contact our office and allow 72 hours for refills to be completed.    Today you received the following chemotherapy and/or immunotherapy agents Kyprolis      To help prevent nausea and vomiting after your treatment, we encourage you to take your nausea medication as directed.  BELOW ARE SYMPTOMS THAT SHOULD BE REPORTED IMMEDIATELY: *FEVER GREATER THAN 100.4 F (38 C) OR HIGHER *CHILLS OR SWEATING *NAUSEA AND VOMITING THAT IS NOT CONTROLLED WITH YOUR NAUSEA MEDICATION *UNUSUAL SHORTNESS OF BREATH *UNUSUAL BRUISING OR BLEEDING *URINARY PROBLEMS (pain or burning when urinating, or frequent urination) *BOWEL PROBLEMS (unusual diarrhea, constipation, pain near the anus) TENDERNESS IN MOUTH AND THROAT WITH OR WITHOUT PRESENCE OF ULCERS (sore throat, sores in mouth, or a toothache) UNUSUAL RASH, SWELLING OR PAIN  UNUSUAL VAGINAL DISCHARGE OR ITCHING   Items with * indicate a potential emergency and should be followed up as soon as possible or go to the Emergency Department if any problems should occur.  Please show the CHEMOTHERAPY ALERT CARD or IMMUNOTHERAPY ALERT CARD at check-in to  the Emergency Department and triage nurse.  Should you have questions after your visit or need to cancel or reschedule your appointment, please contact [facility]  Dept: [phone]  and follow the prompts.  Office hours are 8:00 a.m. to 4:30 p.m. Monday - Friday. Please note that voicemails left after 4:00 p.m. may not be returned until the following business day.  We are closed weekends and major holidays. You have access to a nurse at all times for urgent questions. Please call the main number to the clinic Dept: [phone] and follow the prompts.   For any non-urgent questions, you may also contact your provider using MyChart. We now offer e-Visits for anyone 54 and older to request care online for non-urgent symptoms. For details visit [email or web address].   Also download the MyChart app! Go to the app store, search "MyChart", open the app, select [facility], and log in with your MyChart username and password.  Due to Covid, a mask is required upon entering the hospital/clinic. If you do not have a mask, one will be given to you upon arrival. For doctor visits, patients may have 1 support person aged 28 or older with them. For treatment visits, patients cannot have anyone with them due to current Covid guidelines and our immunocompromised population.   Carfilzomib injection What is this medication? CARFILZOMIB (kar FILZ oh mib) targets a specific protein within cancer cells and stops the cancer cells from growing. It is used to treat multiple myeloma. This medicine may be used for other purposes; ask your health care provider or pharmacist if you  have questions. COMMON BRAND NAME(S): KYPROLIS What should I tell my care team before I take this medication? They need to know if you have any of these conditions: heart disease history of blood clots irregular heartbeat kidney disease liver disease lung or breathing disease an unusual or allergic reaction  to carfilzomib, or other medicines, foods, dyes, or preservatives pregnant or trying to get pregnant breast-feeding How should I use this medication? This medicine is for injection or infusion into a vein. It is given by a health care professional in a hospital or clinic setting. Talk to your pediatrician regarding the use of this medicine in children. Special care may be needed. Overdosage: If you think you have taken too much of this medicine contact a poison control center or emergency room at once. NOTE: This medicine is only for you. Do not share this medicine with others. What if I miss a dose? It is important not to miss your dose. Call your doctor or health care professional if you are unable to keep an appointment. What may interact with this medication? Interactions are not expected. This list may not describe all possible interactions. Give your health care provider a list of all the medicines, herbs, non-prescription drugs, or dietary supplements you use. Also tell them if you smoke, drink alcohol, or use illegal drugs. Some items may interact with your medicine. What should I watch for while using this medication? Your condition will be monitored while you are receiving this medicine. You may need blood work done while you are taking this medicine. Do not become pregnant while taking this medicine or for 6 months after stopping it. Women should inform their health care provider if they wish to become pregnant or think they might be pregnant. Men should not father a child while taking this medicine and for 3 months after stopping it. There is a potential for serious side effects to an unborn child. Talk to your health care provider for more information. Do not breast-feed an infant while taking this medicine or for 2 weeks after stopping it. Check with your health care provider if you have severe diarrhea, nausea, and vomiting, or if you sweat a lot. The loss of too much body fluid may  make it dangerous for you to take this medicine. You may get drowsy or dizzy. Do not drive, use machinery, or do anything that needs mental alertness until you know how this medicine affects you. Do not stand up or sit up quickly, especially if you are an older patient. This reduces the risk of dizzy or fainting spells. What side effects may I notice from receiving this medication? Side effects that you should report to your doctor or health care professional as soon as possible: allergic reactions like skin rash, itching or hives, swelling of the face, lips, or tongue confusion dizziness feeling faint or lightheaded fever or chills palpitations seizures signs and symptoms of bleeding such as bloody or black, tarry stools; red or dark-brown urine; spitting up blood or brown material that looks like coffee grounds; red spots on the skin; unusual bruising or bleeding including from the eye, gums, or nose signs and symptoms of a blood clot such as breathing problems; changes in vision; chest pain; severe, sudden headache; pain, swelling, warmth in the leg; trouble speaking; sudden numbness or weakness of the face, arm or leg signs and symptoms of kidney injury like trouble passing urine or change in the amount of urine signs and symptoms of liver injury like  dark yellow or brown urine; general ill feeling or flu-like symptoms; light-colored stools; loss of appetite; nausea; right upper belly pain; unusually weak or tired; yellowing of the eyes or skin Side effects that usually do not require medical attention (report to your doctor or health care professional if they continue or are bothersome): back pain cough diarrhea headache muscle cramps trouble sleeping vomiting This list may not describe all possible side effects. Call your doctor for medical advice about side effects. You may report side effects to FDA at 1-800-FDA-1088. Where should I keep my medication? This drug is given in a  hospital or clinic and will not be stored at home. NOTE: This sheet is a summary. It may not cover all possible information. If you have questions about this medicine, talk to your doctor, pharmacist, or health care provider.  2022 Elsevier/Gold Standard (2020-08-04 15:24:55)

## 2021-04-05 ENCOUNTER — Telehealth: Payer: Self-pay | Admitting: Hematology

## 2021-04-05 NOTE — Telephone Encounter (Signed)
Scheduled follow-up appointments per 9/6 los. Patient is aware. 

## 2021-04-09 ENCOUNTER — Encounter: Payer: Self-pay | Admitting: Hematology

## 2021-04-09 MED FILL — Dexamethasone Sodium Phosphate Inj 100 MG/10ML: INTRAMUSCULAR | Qty: 2 | Status: AC

## 2021-04-09 NOTE — Progress Notes (Signed)
[facility] FOLLOW-UP progress notes  DOS .04/03/2021  Weekly Patient Care Team: Lavone Orn, MD as PCP - General (Internal Medicine)  CHIEF COMPLAINTS/PURPOSE OF VISIT:  Multiple myeloma, for further evaluation  HISTORY OF PRESENTING ILLNESS:   Brian Levine 75 y.o. male was seen in follow-up for continued evaluation and management of his multiple myeloma. He had has completed 2 cycles of carfilzomib and is starting his third cycle today.  No overt carfilzomib related toxicities.   Patient notes some grade 1 fatigue. Also notes some change in taste sensation.  No fevers no chills no night sweats no new bone pains. No further hematuria or abdominal pain.  Labs today show stable CBC with mild anemia with a hemoglobin of 10.4 WBC count of 3.8k and platelets of 119k. Is lambda free light chains improved from 3203 down to 114 on 03/06/2021.   Oncology History  Multiple myeloma ([place])  01/30/2021 Initial Diagnosis   Multiple myeloma ([facility])   02/06/2021 -  Chemotherapy    Patient is on Treatment Plan: MYELOMA NEWLY DIAGNOSED TRANSPLANT CANDIDATE CARFILZOMIB (20/36)/ LENALIDOMIDE / DEXAMETHASONE (40/20) (KRD) Q28D         MEDICAL HISTORY:  Past Medical History:  Diagnosis Date   BPH (benign prostatic hyperplasia) 2004   Constipation    Other pancytopenia ([facility]) 05/04/2013   Thrombocytopenia, unspecified ([facility]) 08/17/2013   Weight loss 05/04/2013    SURGICAL HISTORY: Past Surgical History:  Procedure Laterality Date   APPENDECTOMY     COLONOSCOPY WITH PROPOFOL N/A 07/17/2015   Procedure: COLONOSCOPY WITH PROPOFOL;  Surgeon: Garlan Fair, MD;  Location: [facility];  Service: Endoscopy;  Laterality: N/A;   CYSTOSCOPY WITH FULGERATION N/A 11/08/2020   Procedure: CYSTOSCOPY WITH FULGERATION OF BLEEDERS;  Surgeon: Irine Seal, MD;  Location: [facility] ORS;  Service: Urology;  Laterality: N/A;   CYSTOSCOPY WITH FULGERATION N/A 12/01/2020   Procedure: CYSTOSCOPY WITH  FULGERATION OF  PROSTATE;  Surgeon: Irine Seal, MD;  Location: [facility];  Service: Urology;  Laterality: N/A;   CYSTOSCOPY WITH INSERTION OF UROLIFT N/A 08/22/2015   Procedure: CYSTOSCOPY WITH INSERTION OF UROLIFT;  Surgeon: Irine Seal, MD;  Location: [facility];  Service: Urology;  Laterality: N/A;   CYSTOSCOPY WITH LITHOLAPAXY N/A 10/24/2020   Procedure: CYSTOSCOPY WITH LITHOLAPAXY;  Surgeon: Irine Seal, MD;  Location: [facility];  Service: Urology;  Laterality: N/A;  REQUESTING 1 HR FOR CASE   EYE SURGERY     right cataract surgery with lens implant   TESTICLE SURGERY Right 2011   hydrocele   TRANSURETHRAL RESECTION OF PROSTATE N/A 10/24/2020   Procedure: TRANSURETHRAL RESECTION OF THE PROSTATE (TURP);  Surgeon: Irine Seal, MD;  Location: [facility];  Service: Urology;  Laterality: N/A;    SOCIAL HISTORY: Social History   Socioeconomic History   Marital status: Married    Spouse name: Not on file   Number of children: Not on file   Years of education: Not on file   Highest education level: Not on file  Occupational History   Not on file  Tobacco Use   Smoking status: Former    Packs/day: 1.50    Years: 30.00    Pack years: 45.00    Types: Cigarettes    Quit date: 07/30/1995    Years since quitting: 25.7   Smokeless tobacco: Never  Vaping Use   Vaping Use: Never used  Substance and Sexual Activity   Alcohol use: No   Drug use: No   Sexual activity: Not  on file  Other Topics Concern   Not on file  Social History Narrative   Not on file   Social Determinants of Health   Financial Resource Strain: Not on file  Food Insecurity: Not on file  Transportation Needs: Not on file  Physical Activity: Not on file  Stress: Not on file  Social Connections: Not on file  Intimate Partner Violence: Not on file    FAMILY HISTORY: No family history on file.  ALLERGIES:  has No Known Allergies.  MEDICATIONS:  Current Outpatient Medications  Medication Sig Dispense Refill   acetaminophen  (TYLENOL) 325 MG tablet Take 650 mg by mouth every 6 (six) hours as needed (for pain.).     acyclovir (ZOVIRAX) 400 MG tablet Take 1 tablet (400 mg total) by mouth daily. 30 tablet 3   aspirin EC 81 MG tablet Take 1 tablet (81 mg total) by mouth daily. 100 tablet 3   calcium carbonate (TUMS - DOSED IN MG ELEMENTAL CALCIUM) 500 MG chewable tablet Chew 1 tablet by mouth daily.     cholecalciferol (VITAMIN D3) 25 MCG (1000 UNIT) tablet Take 5,000 Units by mouth daily.     dexamethasone (DECADRON) 4 MG tablet Take 10 tablets (13m) once on day 22. Repeat every 28 days for the first 4 cycles. Take with breakfast. 40 tablet 4   doxazosin (CARDURA) 8 MG tablet Take 8 mg by mouth daily in the afternoon.     finasteride (PROSCAR) 5 MG tablet Take 5 mg by mouth daily in the afternoon.     lenalidomide (REVLIMID) 15 MG capsule TAKE 1 CAPSULE (15 MG TOTAL) BY MOUTH DAILY FOR 21 DAYS, HOLD 7 DAYS. REPEAT EVERY 28 DAYS 21 capsule 0   lidocaine-prilocaine (EMLA) cream Apply to affected area once 30 g 3   ondansetron (ZOFRAN) 8 MG tablet Take 1 tablet (8 mg total) by mouth 2 (two) times daily as needed (Nausea or vomiting). 30 tablet 1   pravastatin (PRAVACHOL) 40 MG tablet Take 40 mg by mouth daily before supper.     prochlorperazine (COMPAZINE) 10 MG tablet Take 1 tablet (10 mg total) by mouth every 6 (six) hours as needed (Nausea or vomiting). 30 tablet 1   No current facility-administered medications for this visit.    REVIEW OF SYSTEMS:   10 Point review of Systems was done is negative except as noted above.  PHYSICAL EXAMINATION: ECOG PERFORMANCE STATUS: 1 - Symptomatic but completely ambulatory  Vitals:   04/03/21 1237  BP: (!) 133/59  Pulse: (!) 58  Resp: 17  Temp: 98 F (36.7 C)  SpO2: 100%   Filed Weights   04/03/21 1237  Weight: 181 lb 9.6 oz (82.4 kg)   . GENERAL:alert, in no acute distress and comfortable SKIN: no acute rashes, no significant lesions EYES: conjunctiva are pink and  non-injected, sclera anicteric OROPHARYNX: MMM, no exudates, no oropharyngeal erythema or ulceration NECK: supple, no JVD LYMPH:  no palpable lymphadenopathy in the cervical, axillary or inguinal regions LUNGS: clear to auscultation b/l with normal respiratory effort HEART: regular rate & rhythm ABDOMEN:  normoactive bowel sounds , non tender, not distended. Extremity: no pedal edema PSYCH: alert & oriented x 3 with fluent speech NEURO: no focal motor/sensory deficits   LABORATORY DATA:   . CBC Latest Ref Rng & Units 04/03/2021 03/20/2021 03/13/2021  WBC 4.0 - 10.5 K/uL 3.8(L) 3.6(L) 3.4(L)  Hemoglobin 13.0 - 17.0 g/dL 10.4(L) 10.4(L) 11.5(L)  Hematocrit 39.0 - 52.0 % 30.8(L) 30.4(L) 32.6(L)  Platelets 150 - 400 K/uL 119(L) 87(L) 101(L)   . CMP Latest Ref Rng & Units 04/03/2021 03/20/2021 03/13/2021  Glucose 70 - 99 mg/dL 103(H) 100(H) 119(H)  BUN 8 - 23 mg/dL '12 12 11  ' Creatinine 0.61 - 1.24 mg/dL 1.05 1.08 1.18  Sodium 135 - 145 mmol/L 137 138 137  Potassium 3.5 - 5.1 mmol/L 4.2 4.3 4.2  Chloride 98 - 111 mmol/L 105 106 106  CO2 22 - 32 mmol/L '23 24 24  ' Calcium 8.9 - 10.3 mg/dL 8.6(L) 8.4(L) 8.5(L)  Total Protein 6.5 - 8.1 g/dL 5.7(L) 5.5(L) 5.8(L)  Total Bilirubin 0.3 - 1.2 mg/dL 1.2 1.5(H) 1.5(H)  Alkaline Phos 38 - 126 U/L 52 57 55  AST 15 - 41 U/L 15 15 14(L)  ALT 0 - 44 U/L '17 20 17      ' ASSESSMENT & PLAN:   75 yo with    1) Recurrent/persistent hematuria bleeding after prostate surgery. No fhx or previous personal h/o bleeding issues.   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 Is lambda free light chains improved from 3203 down to 114 on 03/06/2021. -No further toxicities from continuing current dose of carfilzomib. -Given good  response and patient's mild diarrhea and muscle cramps will maintain Revlimid at 15 mg 3 weeks on 1 week off at this time. -Repeat myeloma labs with next labs. --Advised pt we would plan to complete between four to six cycles. Each cycle is 28 days. -Continue on 2000 IU Vitamin D daily.  FOLLOW UP:  Please schedule cycle 4 of carfilzomib. Labs on day 1 day 8 and day 15 MD visit with cycle 4-day 1 in 4 weeks  . Orders Placed This Encounter  Procedures   CBC with Differential/Platelet    Standing Status:   Standing    Number of Occurrences:   99    Standing Expiration Date:   04/09/2022   CMP ([facility] only)    Standing Status:   Standing    Number of Occurrences:   99    Standing Expiration Date:   04/09/2022   Multiple Myeloma Panel (SPEP&IFE w/QIG)    Standing Status:   Future    Standing Expiration Date:   04/09/2022   Kappa/lambda light chains    Standing Status:   Future    Standing Expiration Date:   04/09/2022    . The total time spent in the appointment was 32 minutes and more than 50% was on counseling and direct patient cares.  Ordering and management of K[facility] chemotherapy  Sullivan Lone MD MS Hematology/Oncology Physician [facility]

## 2021-04-10 ENCOUNTER — Other Ambulatory Visit: Payer: Self-pay

## 2021-04-10 ENCOUNTER — Inpatient Hospital Stay: Payer: Medicare HMO

## 2021-04-10 VITALS — BP 125/64 | HR 59 | Temp 98.5°F | Resp 18 | Ht 72.0 in | Wt 179.5 lb

## 2021-04-10 DIAGNOSIS — N400 Enlarged prostate without lower urinary tract symptoms: Secondary | ICD-10-CM | POA: Diagnosis not present

## 2021-04-10 DIAGNOSIS — C9000 Multiple myeloma not having achieved remission: Secondary | ICD-10-CM

## 2021-04-10 DIAGNOSIS — Z87891 Personal history of nicotine dependence: Secondary | ICD-10-CM | POA: Diagnosis not present

## 2021-04-10 DIAGNOSIS — Z5112 Encounter for antineoplastic immunotherapy: Secondary | ICD-10-CM | POA: Diagnosis not present

## 2021-04-10 DIAGNOSIS — Z79899 Other long term (current) drug therapy: Secondary | ICD-10-CM | POA: Diagnosis not present

## 2021-04-10 DIAGNOSIS — D649 Anemia, unspecified: Secondary | ICD-10-CM | POA: Diagnosis not present

## 2021-04-10 DIAGNOSIS — Z7982 Long term (current) use of aspirin: Secondary | ICD-10-CM | POA: Diagnosis not present

## 2021-04-10 DIAGNOSIS — Z7189 Other specified counseling: Secondary | ICD-10-CM

## 2021-04-10 LAB — CBC WITH DIFFERENTIAL/PLATELET
Abs Immature Granulocytes: 0.02 10*3/uL (ref 0.00–0.07)
Basophils Absolute: 0 10*3/uL (ref 0.0–0.1)
Basophils Relative: 1 %
Eosinophils Absolute: 0.3 10*3/uL (ref 0.0–0.5)
Eosinophils Relative: 10 %
HCT: 29.2 % — ABNORMAL LOW (ref 39.0–52.0)
Hemoglobin: 10.2 g/dL — ABNORMAL LOW (ref 13.0–17.0)
Immature Granulocytes: 1 %
Lymphocytes Relative: 26 %
Lymphs Abs: 0.8 10*3/uL (ref 0.7–4.0)
MCH: 31.3 pg (ref 26.0–34.0)
MCHC: 34.9 g/dL (ref 30.0–36.0)
MCV: 89.6 fL (ref 80.0–100.0)
Monocytes Absolute: 0.2 10*3/uL (ref 0.1–1.0)
Monocytes Relative: 6 %
Neutro Abs: 1.8 10*3/uL (ref 1.7–7.7)
Neutrophils Relative %: 56 %
Platelets: 84 10*3/uL — ABNORMAL LOW (ref 150–400)
RBC: 3.26 MIL/uL — ABNORMAL LOW (ref 4.22–5.81)
RDW: 14.9 % (ref 11.5–15.5)
WBC: 3.2 10*3/uL — ABNORMAL LOW (ref 4.0–10.5)
nRBC: 0 % (ref 0.0–0.2)

## 2021-04-10 LAB — CMP (CANCER CENTER ONLY)
ALT: 19 U/L (ref 0–44)
AST: 17 U/L (ref 15–41)
Albumin: 3.7 g/dL (ref 3.5–5.0)
Alkaline Phosphatase: 55 U/L (ref 38–126)
Anion gap: 9 (ref 5–15)
BUN: 11 mg/dL (ref 8–23)
CO2: 24 mmol/L (ref 22–32)
Calcium: 8.7 mg/dL — ABNORMAL LOW (ref 8.9–10.3)
Chloride: 104 mmol/L (ref 98–111)
Creatinine: 1.02 mg/dL (ref 0.61–1.24)
GFR, Estimated: >60 mL/min (ref >60)
Glucose, Bld: 124 mg/dL — ABNORMAL HIGH (ref 70–99)
Potassium: 4.1 mmol/L (ref 3.5–5.1)
Sodium: 137 mmol/L (ref 135–145)
Total Bilirubin: 1.8 mg/dL — ABNORMAL HIGH (ref 0.3–1.2)
Total Protein: 5.7 g/dL — ABNORMAL LOW (ref 6.5–8.1)

## 2021-04-10 MED ORDER — SODIUM CHLORIDE 0.9 % IV SOLN: Freq: Once | INTRAVENOUS | Status: AC

## 2021-04-10 MED ORDER — DEXTROSE 5 % IV SOLN
35.0000 mg/m2 | Freq: Once | INTRAVENOUS | Status: AC
  Administered 2021-04-10: 70 mg via INTRAVENOUS
  Filled 2021-04-10: qty 5

## 2021-04-10 MED ORDER — ZOLEDRONIC ACID 4 MG/100ML IV SOLN
4.0000 mg | Freq: Once | INTRAVENOUS | Status: AC
  Administered 2021-04-10: 4 mg via INTRAVENOUS
  Filled 2021-04-10: qty 100

## 2021-04-10 MED ORDER — SODIUM CHLORIDE 0.9 % IV SOLN
20.0000 mg | Freq: Once | INTRAVENOUS | Status: AC
  Administered 2021-04-10: 20 mg via INTRAVENOUS
  Filled 2021-04-10: qty 20

## 2021-04-10 MED FILL — Dexamethasone Sodium Phosphate Inj 100 MG/10ML: INTRAMUSCULAR | Qty: 2 | Status: AC

## 2021-04-10 NOTE — Progress Notes (Signed)
Per Dr. Irene Limbo - okay to treat with elevated bilirubin and decreased platelets today.

## 2021-04-10 NOTE — Patient Instructions (Signed)
[facility]   Discharge Instructions: Thank you for choosing [facility] to provide your oncology and hematology care.   If you have a lab appointment with the [facility], please go directly to the [facility] and check in at the registration area.   Wear comfortable clothing and clothing appropriate for easy access to any Portacath or PICC line.   We strive to give you quality time with your provider. You may need to reschedule your appointment if you arrive late (15 or more minutes).  Arriving late affects you and other patients whose appointments are after yours.  Also, if you miss three or more appointments without notifying the office, you may be dismissed from the clinic at the provider's discretion.      For prescription refill requests, have your pharmacy contact our office and allow 72 hours for refills to be completed.    Today you received the following chemotherapy and/or immunotherapy agents: Carfilzomib (Kyprolis) and Zometa      To help prevent nausea and vomiting after your treatment, we encourage you to take your nausea medication as directed.  BELOW ARE SYMPTOMS THAT SHOULD BE REPORTED IMMEDIATELY: *FEVER GREATER THAN 100.4 F (38 C) OR HIGHER *CHILLS OR SWEATING *NAUSEA AND VOMITING THAT IS NOT CONTROLLED WITH YOUR NAUSEA MEDICATION *UNUSUAL SHORTNESS OF BREATH *UNUSUAL BRUISING OR BLEEDING *URINARY PROBLEMS (pain or burning when urinating, or frequent urination) *BOWEL PROBLEMS (unusual diarrhea, constipation, pain near the anus) TENDERNESS IN MOUTH AND THROAT WITH OR WITHOUT PRESENCE OF ULCERS (sore throat, sores in mouth, or a toothache) UNUSUAL RASH, SWELLING OR PAIN  UNUSUAL VAGINAL DISCHARGE OR ITCHING   Items with * indicate a potential emergency and should be followed up as soon as possible or go to the Emergency Department if any problems should occur.  Please show the CHEMOTHERAPY ALERT CARD or IMMUNOTHERAPY  ALERT CARD at check-in to the Emergency Department and triage nurse.  Should you have questions after your visit or need to cancel or reschedule your appointment, please contact [facility]  Dept: [phone]  and follow the prompts.  Office hours are 8:00 a.m. to 4:30 p.m. Monday - Friday. Please note that voicemails left after 4:00 p.m. may not be returned until the following business day.  We are closed weekends and major holidays. You have access to a nurse at all times for urgent questions. Please call the main number to the clinic Dept: [phone] and follow the prompts.   For any non-urgent questions, you may also contact your provider using MyChart. We now offer e-Visits for anyone 42 and older to request care online for non-urgent symptoms. For details visit [email or web address].   Also download the MyChart app! Go to the app store, search "MyChart", open the app, select [facility], and log in with your MyChart username and password.  Due to Covid, a mask is required upon entering the hospital/clinic. If you do not have a mask, one will be given to you upon arrival. For doctor visits, patients may have 1 support person aged 76 or older with them. For treatment visits, patients cannot have anyone with them due to current Covid guidelines and our immunocompromised population.

## 2021-04-11 ENCOUNTER — Inpatient Hospital Stay: Payer: Medicare HMO

## 2021-04-11 VITALS — BP 139/63 | HR 64 | Temp 98.2°F | Resp 16

## 2021-04-11 DIAGNOSIS — C9000 Multiple myeloma not having achieved remission: Secondary | ICD-10-CM | POA: Diagnosis not present

## 2021-04-11 DIAGNOSIS — Z87891 Personal history of nicotine dependence: Secondary | ICD-10-CM | POA: Diagnosis not present

## 2021-04-11 DIAGNOSIS — Z7982 Long term (current) use of aspirin: Secondary | ICD-10-CM | POA: Diagnosis not present

## 2021-04-11 DIAGNOSIS — N400 Enlarged prostate without lower urinary tract symptoms: Secondary | ICD-10-CM | POA: Diagnosis not present

## 2021-04-11 DIAGNOSIS — Z5112 Encounter for antineoplastic immunotherapy: Secondary | ICD-10-CM | POA: Diagnosis not present

## 2021-04-11 DIAGNOSIS — Z79899 Other long term (current) drug therapy: Secondary | ICD-10-CM | POA: Diagnosis not present

## 2021-04-11 DIAGNOSIS — Z7189 Other specified counseling: Secondary | ICD-10-CM

## 2021-04-11 DIAGNOSIS — D649 Anemia, unspecified: Secondary | ICD-10-CM | POA: Diagnosis not present

## 2021-04-11 LAB — KAPPA/LAMBDA LIGHT CHAINS
Kappa free light chain: 16.3 mg/L (ref 3.3–19.4)
Kappa, lambda light chain ratio: 1.03 (ref 0.26–1.65)
Lambda free light chains: 15.8 mg/L (ref 5.7–26.3)

## 2021-04-11 MED ORDER — PROCHLORPERAZINE MALEATE 10 MG PO TABS
10.0000 mg | ORAL_TABLET | Freq: Once | ORAL | Status: AC
  Administered 2021-04-11: 10 mg via ORAL
  Filled 2021-04-11: qty 1

## 2021-04-11 MED ORDER — SODIUM CHLORIDE 0.9 % IV SOLN
20.0000 mg | Freq: Once | INTRAVENOUS | Status: AC
  Administered 2021-04-11: 20 mg via INTRAVENOUS
  Filled 2021-04-11: qty 20

## 2021-04-11 MED ORDER — DEXTROSE 5 % IV SOLN
35.0000 mg/m2 | Freq: Once | INTRAVENOUS | Status: AC
  Administered 2021-04-11: 70 mg via INTRAVENOUS
  Filled 2021-04-11: qty 30

## 2021-04-11 MED ORDER — SODIUM CHLORIDE 0.9 % IV SOLN: Freq: Once | INTRAVENOUS | Status: AC

## 2021-04-11 NOTE — Patient Instructions (Signed)
[facility]  Discharge Instructions: Thank you for choosing [facility] to provide your oncology and hematology care.   If you have a lab appointment with the Cancer Center, please go directly to the Cancer Center and check in at the registration area.   Wear comfortable clothing and clothing appropriate for easy access to any Portacath or PICC line.   We strive to give you quality time with your provider. You may need to reschedule your appointment if you arrive late (15 or more minutes).  Arriving late affects you and other patients whose appointments are after yours.  Also, if you miss three or more appointments without notifying the office, you may be dismissed from the clinic at the provider's discretion.      For prescription refill requests, have your pharmacy contact our office and allow 72 hours for refills to be completed.    Today you received the following chemotherapy and/or immunotherapy agents carfilzomib   To help prevent nausea and vomiting after your treatment, we encourage you to take your nausea medication as directed.  BELOW ARE SYMPTOMS THAT SHOULD BE REPORTED IMMEDIATELY: . *FEVER GREATER THAN 100.4 F (38 C) OR HIGHER . *CHILLS OR SWEATING . *NAUSEA AND VOMITING THAT IS NOT CONTROLLED WITH YOUR NAUSEA MEDICATION . *UNUSUAL SHORTNESS OF BREATH . *UNUSUAL BRUISING OR BLEEDING . *URINARY PROBLEMS (pain or burning when urinating, or frequent urination) . *BOWEL PROBLEMS (unusual diarrhea, constipation, pain near the anus) . TENDERNESS IN MOUTH AND THROAT WITH OR WITHOUT PRESENCE OF ULCERS (sore throat, sores in mouth, or a toothache) . UNUSUAL RASH, SWELLING OR PAIN  . UNUSUAL VAGINAL DISCHARGE OR ITCHING   Items with * indicate a potential emergency and should be followed up as soon as possible or go to the Emergency Department if any problems should occur.  Please show the CHEMOTHERAPY ALERT CARD or IMMUNOTHERAPY ALERT  CARD at check-in to the Emergency Department and triage nurse.  Should you have questions after your visit or need to cancel or reschedule your appointment, please contact [facility]  Dept: [phone]  and follow the prompts.  Office hours are 8:00 a.m. to 4:30 p.m. Monday - Friday. Please note that voicemails left after 4:00 p.m. may not be returned until the following business day.  We are closed weekends and major holidays. You have access to a nurse at all times for urgent questions. Please call the main number to the clinic Dept: [phone] and follow the prompts.   For any non-urgent questions, you may also contact your provider using MyChart. We now offer e-Visits for anyone 18 and older to request care online for non-urgent symptoms. For details visit [email or web address].   Also download the MyChart app! Go to the app store, search "MyChart", open the app, select [facility], and log in with your MyChart username and password.  Due to Covid, a mask is required upon entering the hospital/clinic. If you do not have a mask, one will be given to you upon arrival. For doctor visits, patients may have 1 support person aged 18 or older with them. For treatment visits, patients cannot have anyone with them due to current Covid guidelines and our immunocompromised population.   

## 2021-04-13 ENCOUNTER — Other Ambulatory Visit: Payer: Self-pay | Admitting: Hematology

## 2021-04-13 DIAGNOSIS — C9000 Multiple myeloma not having achieved remission: Secondary | ICD-10-CM

## 2021-04-13 DIAGNOSIS — Z7189 Other specified counseling: Secondary | ICD-10-CM

## 2021-04-16 LAB — MULTIPLE MYELOMA PANEL, SERUM
Albumin SerPl Elph-Mcnc: 3.4 g/dL (ref 2.9–4.4)
Albumin/Glob SerPl: 1.8 — ABNORMAL HIGH (ref 0.7–1.7)
Alpha 1: 0.3 g/dL (ref 0.0–0.4)
Alpha2 Glob SerPl Elph-Mcnc: 0.6 g/dL (ref 0.4–1.0)
B-Globulin SerPl Elph-Mcnc: 0.7 g/dL (ref 0.7–1.3)
Gamma Glob SerPl Elph-Mcnc: 0.4 g/dL (ref 0.4–1.8)
Globulin, Total: 1.9 g/dL — ABNORMAL LOW (ref 2.2–3.9)
IgA: 59 mg/dL — ABNORMAL LOW (ref 61–437)
IgG (Immunoglobin G), Serum: 496 mg/dL — ABNORMAL LOW (ref 603–1613)
IgM (Immunoglobulin M), Srm: 34 mg/dL (ref 15–143)
Total Protein ELP: 5.3 g/dL — ABNORMAL LOW (ref 6.0–8.5)

## 2021-04-16 MED FILL — Dexamethasone Sodium Phosphate Inj 100 MG/10ML: INTRAMUSCULAR | Qty: 2 | Status: AC

## 2021-04-17 ENCOUNTER — Other Ambulatory Visit: Payer: Self-pay

## 2021-04-17 ENCOUNTER — Inpatient Hospital Stay: Payer: Medicare HMO

## 2021-04-17 ENCOUNTER — Encounter: Payer: Self-pay | Admitting: Hematology

## 2021-04-17 VITALS — BP 134/67 | HR 64 | Temp 98.5°F | Resp 17 | Wt 175.8 lb

## 2021-04-17 DIAGNOSIS — Z5112 Encounter for antineoplastic immunotherapy: Secondary | ICD-10-CM | POA: Diagnosis not present

## 2021-04-17 DIAGNOSIS — C9000 Multiple myeloma not having achieved remission: Secondary | ICD-10-CM

## 2021-04-17 DIAGNOSIS — D649 Anemia, unspecified: Secondary | ICD-10-CM | POA: Diagnosis not present

## 2021-04-17 DIAGNOSIS — Z7189 Other specified counseling: Secondary | ICD-10-CM

## 2021-04-17 DIAGNOSIS — Z79899 Other long term (current) drug therapy: Secondary | ICD-10-CM | POA: Diagnosis not present

## 2021-04-17 DIAGNOSIS — Z7982 Long term (current) use of aspirin: Secondary | ICD-10-CM | POA: Diagnosis not present

## 2021-04-17 DIAGNOSIS — Z87891 Personal history of nicotine dependence: Secondary | ICD-10-CM | POA: Diagnosis not present

## 2021-04-17 DIAGNOSIS — N400 Enlarged prostate without lower urinary tract symptoms: Secondary | ICD-10-CM | POA: Diagnosis not present

## 2021-04-17 LAB — CBC WITH DIFFERENTIAL/PLATELET
Abs Immature Granulocytes: 0.02 10*3/uL (ref 0.00–0.07)
Basophils Absolute: 0 10*3/uL (ref 0.0–0.1)
Basophils Relative: 1 %
Eosinophils Absolute: 0.5 10*3/uL (ref 0.0–0.5)
Eosinophils Relative: 12 %
HCT: 28.9 % — ABNORMAL LOW (ref 39.0–52.0)
Hemoglobin: 10.1 g/dL — ABNORMAL LOW (ref 13.0–17.0)
Immature Granulocytes: 1 %
Lymphocytes Relative: 18 %
Lymphs Abs: 0.8 10*3/uL (ref 0.7–4.0)
MCH: 31.7 pg (ref 26.0–34.0)
MCHC: 34.9 g/dL (ref 30.0–36.0)
MCV: 90.6 fL (ref 80.0–100.0)
Monocytes Absolute: 0.5 10*3/uL (ref 0.1–1.0)
Monocytes Relative: 12 %
Neutro Abs: 2.4 10*3/uL (ref 1.7–7.7)
Neutrophils Relative %: 56 %
Platelets: 70 10*3/uL — ABNORMAL LOW (ref 150–400)
RBC: 3.19 MIL/uL — ABNORMAL LOW (ref 4.22–5.81)
RDW: 14.2 % (ref 11.5–15.5)
WBC: 4.2 10*3/uL (ref 4.0–10.5)
nRBC: 0 % (ref 0.0–0.2)

## 2021-04-17 LAB — CMP (CANCER CENTER ONLY)
ALT: 15 U/L (ref 0–44)
AST: 11 U/L — ABNORMAL LOW (ref 15–41)
Albumin: 3.4 g/dL — ABNORMAL LOW (ref 3.5–5.0)
Alkaline Phosphatase: 52 U/L (ref 38–126)
Anion gap: 10 (ref 5–15)
BUN: 12 mg/dL (ref 8–23)
CO2: 22 mmol/L (ref 22–32)
Calcium: 8.6 mg/dL — ABNORMAL LOW (ref 8.9–10.3)
Chloride: 105 mmol/L (ref 98–111)
Creatinine: 1.05 mg/dL (ref 0.61–1.24)
GFR, Estimated: >60 mL/min (ref >60)
Glucose, Bld: 109 mg/dL — ABNORMAL HIGH (ref 70–99)
Potassium: 4.4 mmol/L (ref 3.5–5.1)
Sodium: 137 mmol/L (ref 135–145)
Total Bilirubin: 1.6 mg/dL — ABNORMAL HIGH (ref 0.3–1.2)
Total Protein: 5.8 g/dL — ABNORMAL LOW (ref 6.5–8.1)

## 2021-04-17 MED ORDER — SODIUM CHLORIDE 0.9 % IV SOLN
20.0000 mg | Freq: Once | INTRAVENOUS | Status: AC
  Administered 2021-04-17: 20 mg via INTRAVENOUS
  Filled 2021-04-17: qty 20

## 2021-04-17 MED ORDER — SODIUM CHLORIDE 0.9 % IV SOLN: Freq: Once | INTRAVENOUS | Status: DC

## 2021-04-17 MED ORDER — SODIUM CHLORIDE 0.9 % IV SOLN: Freq: Once | INTRAVENOUS | Status: AC

## 2021-04-17 MED ORDER — DEXTROSE 5 % IV SOLN
35.0000 mg/m2 | Freq: Once | INTRAVENOUS | Status: AC
  Administered 2021-04-17: 70 mg via INTRAVENOUS
  Filled 2021-04-17: qty 30

## 2021-04-17 MED FILL — Dexamethasone Sodium Phosphate Inj 100 MG/10ML: INTRAMUSCULAR | Qty: 2 | Status: AC

## 2021-04-17 NOTE — Progress Notes (Signed)
Okay to treat with plt 70 and elevated bilirubin per Dr. Irene Limbo

## 2021-04-17 NOTE — Patient Instructions (Signed)
[facility]  Discharge Instructions: Thank you for choosing [facility] to provide your oncology and hematology care.   If you have a lab appointment with the Cancer Center, please go directly to the Cancer Center and check in at the registration area.   Wear comfortable clothing and clothing appropriate for easy access to any Portacath or PICC line.   We strive to give you quality time with your provider. You may need to reschedule your appointment if you arrive late (15 or more minutes).  Arriving late affects you and other patients whose appointments are after yours.  Also, if you miss three or more appointments without notifying the office, you may be dismissed from the clinic at the provider's discretion.      For prescription refill requests, have your pharmacy contact our office and allow 72 hours for refills to be completed.    Today you received the following chemotherapy and/or immunotherapy agents: Kyprolis    To help prevent nausea and vomiting after your treatment, we encourage you to take your nausea medication as directed.  BELOW ARE SYMPTOMS THAT SHOULD BE REPORTED IMMEDIATELY: . *FEVER GREATER THAN 100.4 F (38 C) OR HIGHER . *CHILLS OR SWEATING . *NAUSEA AND VOMITING THAT IS NOT CONTROLLED WITH YOUR NAUSEA MEDICATION . *UNUSUAL SHORTNESS OF BREATH . *UNUSUAL BRUISING OR BLEEDING . *URINARY PROBLEMS (pain or burning when urinating, or frequent urination) . *BOWEL PROBLEMS (unusual diarrhea, constipation, pain near the anus) . TENDERNESS IN MOUTH AND THROAT WITH OR WITHOUT PRESENCE OF ULCERS (sore throat, sores in mouth, or a toothache) . UNUSUAL RASH, SWELLING OR PAIN  . UNUSUAL VAGINAL DISCHARGE OR ITCHING   Items with * indicate a potential emergency and should be followed up as soon as possible or go to the Emergency Department if any problems should occur.  Please show the CHEMOTHERAPY ALERT CARD or IMMUNOTHERAPY ALERT  CARD at check-in to the Emergency Department and triage nurse.  Should you have questions after your visit or need to cancel or reschedule your appointment, please contact [facility]  Dept: [phone]  and follow the prompts.  Office hours are 8:00 a.m. to 4:30 p.m. Monday - Friday. Please note that voicemails left after 4:00 p.m. may not be returned until the following business day.  We are closed weekends and major holidays. You have access to a nurse at all times for urgent questions. Please call the main number to the clinic Dept: [phone] and follow the prompts.   For any non-urgent questions, you may also contact your provider using MyChart. We now offer e-Visits for anyone 18 and older to request care online for non-urgent symptoms. For details visit [email or web address].   Also download the MyChart app! Go to the app store, search "MyChart", open the app, select [facility], and log in with your MyChart username and password.  Due to Covid, a mask is required upon entering the hospital/clinic. If you do not have a mask, one will be given to you upon arrival. For doctor visits, patients may have 1 support person aged 18 or older with them. For treatment visits, patients cannot have anyone with them due to current Covid guidelines and our immunocompromised population.   

## 2021-04-18 ENCOUNTER — Inpatient Hospital Stay: Payer: Medicare HMO

## 2021-04-18 VITALS — BP 144/63 | HR 60 | Temp 98.1°F | Resp 16

## 2021-04-18 DIAGNOSIS — C9000 Multiple myeloma not having achieved remission: Secondary | ICD-10-CM | POA: Diagnosis not present

## 2021-04-18 DIAGNOSIS — Z7189 Other specified counseling: Secondary | ICD-10-CM

## 2021-04-18 DIAGNOSIS — Z79899 Other long term (current) drug therapy: Secondary | ICD-10-CM | POA: Diagnosis not present

## 2021-04-18 DIAGNOSIS — D649 Anemia, unspecified: Secondary | ICD-10-CM | POA: Diagnosis not present

## 2021-04-18 DIAGNOSIS — N400 Enlarged prostate without lower urinary tract symptoms: Secondary | ICD-10-CM | POA: Diagnosis not present

## 2021-04-18 DIAGNOSIS — Z87891 Personal history of nicotine dependence: Secondary | ICD-10-CM | POA: Diagnosis not present

## 2021-04-18 DIAGNOSIS — Z5112 Encounter for antineoplastic immunotherapy: Secondary | ICD-10-CM | POA: Diagnosis not present

## 2021-04-18 DIAGNOSIS — Z7982 Long term (current) use of aspirin: Secondary | ICD-10-CM | POA: Diagnosis not present

## 2021-04-18 MED ORDER — PROCHLORPERAZINE MALEATE 10 MG PO TABS
10.0000 mg | ORAL_TABLET | Freq: Once | ORAL | Status: AC
  Administered 2021-04-18: 10 mg via ORAL
  Filled 2021-04-18: qty 1

## 2021-04-18 MED ORDER — SODIUM CHLORIDE 0.9 % IV SOLN: Freq: Once | INTRAVENOUS | Status: AC

## 2021-04-18 MED ORDER — SODIUM CHLORIDE 0.9 % IV SOLN
20.0000 mg | Freq: Once | INTRAVENOUS | Status: AC
  Administered 2021-04-18: 20 mg via INTRAVENOUS
  Filled 2021-04-18: qty 20

## 2021-04-18 MED ORDER — DEXTROSE 5 % IV SOLN
35.0000 mg/m2 | Freq: Once | INTRAVENOUS | Status: AC
  Administered 2021-04-18: 70 mg via INTRAVENOUS
  Filled 2021-04-18: qty 5

## 2021-04-18 MED ORDER — SODIUM CHLORIDE 0.9 % IV SOLN
20.0000 mg | Freq: Once | INTRAVENOUS | Status: DC
  Filled 2021-04-18: qty 2

## 2021-04-18 NOTE — Patient Instructions (Signed)
[facility]  Discharge Instructions: Thank you for choosing [facility] to provide your oncology and hematology care.   If you have a lab appointment with the Cancer Center, please go directly to the Cancer Center and check in at the registration area.   Wear comfortable clothing and clothing appropriate for easy access to any Portacath or PICC line.   We strive to give you quality time with your provider. You may need to reschedule your appointment if you arrive late (15 or more minutes).  Arriving late affects you and other patients whose appointments are after yours.  Also, if you miss three or more appointments without notifying the office, you may be dismissed from the clinic at the provider's discretion.      For prescription refill requests, have your pharmacy contact our office and allow 72 hours for refills to be completed.    Today you received the following chemotherapy and/or immunotherapy agents: Kyprolis    To help prevent nausea and vomiting after your treatment, we encourage you to take your nausea medication as directed.  BELOW ARE SYMPTOMS THAT SHOULD BE REPORTED IMMEDIATELY: . *FEVER GREATER THAN 100.4 F (38 C) OR HIGHER . *CHILLS OR SWEATING . *NAUSEA AND VOMITING THAT IS NOT CONTROLLED WITH YOUR NAUSEA MEDICATION . *UNUSUAL SHORTNESS OF BREATH . *UNUSUAL BRUISING OR BLEEDING . *URINARY PROBLEMS (pain or burning when urinating, or frequent urination) . *BOWEL PROBLEMS (unusual diarrhea, constipation, pain near the anus) . TENDERNESS IN MOUTH AND THROAT WITH OR WITHOUT PRESENCE OF ULCERS (sore throat, sores in mouth, or a toothache) . UNUSUAL RASH, SWELLING OR PAIN  . UNUSUAL VAGINAL DISCHARGE OR ITCHING   Items with * indicate a potential emergency and should be followed up as soon as possible or go to the Emergency Department if any problems should occur.  Please show the CHEMOTHERAPY ALERT CARD or IMMUNOTHERAPY ALERT  CARD at check-in to the Emergency Department and triage nurse.  Should you have questions after your visit or need to cancel or reschedule your appointment, please contact [facility]  Dept: [phone]  and follow the prompts.  Office hours are 8:00 a.m. to 4:30 p.m. Monday - Friday. Please note that voicemails left after 4:00 p.m. may not be returned until the following business day.  We are closed weekends and major holidays. You have access to a nurse at all times for urgent questions. Please call the main number to the clinic Dept: [phone] and follow the prompts.   For any non-urgent questions, you may also contact your provider using MyChart. We now offer e-Visits for anyone 18 and older to request care online for non-urgent symptoms. For details visit [email or web address].   Also download the MyChart app! Go to the app store, search "MyChart", open the app, select [facility], and log in with your MyChart username and password.  Due to Covid, a mask is required upon entering the hospital/clinic. If you do not have a mask, one will be given to you upon arrival. For doctor visits, patients may have 1 support person aged 18 or older with them. For treatment visits, patients cannot have anyone with them due to current Covid guidelines and our immunocompromised population.   

## 2021-05-01 ENCOUNTER — Inpatient Hospital Stay: Payer: Medicare HMO

## 2021-05-01 ENCOUNTER — Inpatient Hospital Stay: Payer: Medicare HMO | Attending: Hematology

## 2021-05-01 ENCOUNTER — Inpatient Hospital Stay (HOSPITAL_BASED_OUTPATIENT_CLINIC_OR_DEPARTMENT_OTHER): Payer: Medicare HMO | Admitting: Hematology

## 2021-05-01 ENCOUNTER — Other Ambulatory Visit: Payer: Self-pay

## 2021-05-01 VITALS — BP 138/62 | HR 66 | Temp 98.1°F | Resp 16 | Ht 72.0 in | Wt 180.2 lb

## 2021-05-01 DIAGNOSIS — C9000 Multiple myeloma not having achieved remission: Secondary | ICD-10-CM | POA: Diagnosis not present

## 2021-05-01 DIAGNOSIS — Z5112 Encounter for antineoplastic immunotherapy: Secondary | ICD-10-CM | POA: Insufficient documentation

## 2021-05-01 DIAGNOSIS — Z79899 Other long term (current) drug therapy: Secondary | ICD-10-CM | POA: Diagnosis not present

## 2021-05-01 DIAGNOSIS — Z7982 Long term (current) use of aspirin: Secondary | ICD-10-CM | POA: Insufficient documentation

## 2021-05-01 DIAGNOSIS — Z87891 Personal history of nicotine dependence: Secondary | ICD-10-CM | POA: Diagnosis not present

## 2021-05-01 DIAGNOSIS — Z7189 Other specified counseling: Secondary | ICD-10-CM

## 2021-05-01 DIAGNOSIS — Z5111 Encounter for antineoplastic chemotherapy: Secondary | ICD-10-CM

## 2021-05-01 LAB — CBC WITH DIFFERENTIAL/PLATELET
Abs Immature Granulocytes: 0 10*3/uL (ref 0.00–0.07)
Basophils Absolute: 0.1 10*3/uL (ref 0.0–0.1)
Basophils Relative: 3 %
Eosinophils Absolute: 0.1 10*3/uL (ref 0.0–0.5)
Eosinophils Relative: 5 %
HCT: 28.8 % — ABNORMAL LOW (ref 39.0–52.0)
Hemoglobin: 9.7 g/dL — ABNORMAL LOW (ref 13.0–17.0)
Immature Granulocytes: 0 %
Lymphocytes Relative: 36 %
Lymphs Abs: 1 10*3/uL (ref 0.7–4.0)
MCH: 31.6 pg (ref 26.0–34.0)
MCHC: 33.7 g/dL (ref 30.0–36.0)
MCV: 93.8 fL (ref 80.0–100.0)
Monocytes Absolute: 0.5 10*3/uL (ref 0.1–1.0)
Monocytes Relative: 17 %
Neutro Abs: 1.1 10*3/uL — ABNORMAL LOW (ref 1.7–7.7)
Neutrophils Relative %: 39 %
Platelets: 128 10*3/uL — ABNORMAL LOW (ref 150–400)
RBC: 3.07 MIL/uL — ABNORMAL LOW (ref 4.22–5.81)
RDW: 14.6 % (ref 11.5–15.5)
WBC: 2.8 10*3/uL — ABNORMAL LOW (ref 4.0–10.5)
nRBC: 0 % (ref 0.0–0.2)

## 2021-05-01 LAB — CMP (CANCER CENTER ONLY)
ALT: 17 U/L (ref 0–44)
AST: 15 U/L (ref 15–41)
Albumin: 3.6 g/dL (ref 3.5–5.0)
Alkaline Phosphatase: 52 U/L (ref 38–126)
Anion gap: 8 (ref 5–15)
BUN: 9 mg/dL (ref 8–23)
CO2: 23 mmol/L (ref 22–32)
Calcium: 8.8 mg/dL — ABNORMAL LOW (ref 8.9–10.3)
Chloride: 105 mmol/L (ref 98–111)
Creatinine: 1.03 mg/dL (ref 0.61–1.24)
GFR, Estimated: >60 mL/min (ref >60)
Glucose, Bld: 99 mg/dL (ref 70–99)
Potassium: 4.2 mmol/L (ref 3.5–5.1)
Sodium: 136 mmol/L (ref 135–145)
Total Bilirubin: 1.3 mg/dL — ABNORMAL HIGH (ref 0.3–1.2)
Total Protein: 5.8 g/dL — ABNORMAL LOW (ref 6.5–8.1)

## 2021-05-01 MED ORDER — SODIUM CHLORIDE 0.9 % IV SOLN: Freq: Once | INTRAVENOUS | Status: AC

## 2021-05-01 MED ORDER — SODIUM CHLORIDE 0.9 % IV SOLN
20.0000 mg | Freq: Once | INTRAVENOUS | Status: AC
  Administered 2021-05-01: 20 mg via INTRAVENOUS
  Filled 2021-05-01: qty 20

## 2021-05-01 MED ORDER — DEXTROSE 5 % IV SOLN
35.0000 mg/m2 | Freq: Once | INTRAVENOUS | Status: AC
  Administered 2021-05-01: 70 mg via INTRAVENOUS
  Filled 2021-05-01: qty 30

## 2021-05-01 NOTE — Progress Notes (Signed)
OK to trt with ANC of 1.1 per Dr. Irene Limbo

## 2021-05-01 NOTE — Patient Instructions (Signed)
[facility]  Discharge Instructions: Thank you for choosing [facility] to provide your oncology and hematology care.   If you have a lab appointment with the Cancer Center, please go directly to the Cancer Center and check in at the registration area.   Wear comfortable clothing and clothing appropriate for easy access to any Portacath or PICC line.   We strive to give you quality time with your provider. You may need to reschedule your appointment if you arrive late (15 or more minutes).  Arriving late affects you and other patients whose appointments are after yours.  Also, if you miss three or more appointments without notifying the office, you may be dismissed from the clinic at the provider's discretion.      For prescription refill requests, have your pharmacy contact our office and allow 72 hours for refills to be completed.    Today you received the following chemotherapy and/or immunotherapy agents: Kyprolis    To help prevent nausea and vomiting after your treatment, we encourage you to take your nausea medication as directed.  BELOW ARE SYMPTOMS THAT SHOULD BE REPORTED IMMEDIATELY: . *FEVER GREATER THAN 100.4 F (38 C) OR HIGHER . *CHILLS OR SWEATING . *NAUSEA AND VOMITING THAT IS NOT CONTROLLED WITH YOUR NAUSEA MEDICATION . *UNUSUAL SHORTNESS OF BREATH . *UNUSUAL BRUISING OR BLEEDING . *URINARY PROBLEMS (pain or burning when urinating, or frequent urination) . *BOWEL PROBLEMS (unusual diarrhea, constipation, pain near the anus) . TENDERNESS IN MOUTH AND THROAT WITH OR WITHOUT PRESENCE OF ULCERS (sore throat, sores in mouth, or a toothache) . UNUSUAL RASH, SWELLING OR PAIN  . UNUSUAL VAGINAL DISCHARGE OR ITCHING   Items with * indicate a potential emergency and should be followed up as soon as possible or go to the Emergency Department if any problems should occur.  Please show the CHEMOTHERAPY ALERT CARD or IMMUNOTHERAPY ALERT  CARD at check-in to the Emergency Department and triage nurse.  Should you have questions after your visit or need to cancel or reschedule your appointment, please contact [facility]  Dept: [phone]  and follow the prompts.  Office hours are 8:00 a.m. to 4:30 p.m. Monday - Friday. Please note that voicemails left after 4:00 p.m. may not be returned until the following business day.  We are closed weekends and major holidays. You have access to a nurse at all times for urgent questions. Please call the main number to the clinic Dept: [phone] and follow the prompts.   For any non-urgent questions, you may also contact your provider using MyChart. We now offer e-Visits for anyone 18 and older to request care online for non-urgent symptoms. For details visit [email or web address].   Also download the MyChart app! Go to the app store, search "MyChart", open the app, select [facility], and log in with your MyChart username and password.  Due to Covid, a mask is required upon entering the hospital/clinic. If you do not have a mask, one will be given to you upon arrival. For doctor visits, patients may have 1 support person aged 18 or older with them. For treatment visits, patients cannot have anyone with them due to current Covid guidelines and our immunocompromised population.   

## 2021-05-02 ENCOUNTER — Telehealth: Payer: Self-pay | Admitting: Hematology

## 2021-05-02 ENCOUNTER — Inpatient Hospital Stay: Payer: Medicare HMO

## 2021-05-02 VITALS — BP 133/59 | HR 60 | Temp 98.1°F | Resp 16

## 2021-05-02 DIAGNOSIS — C9000 Multiple myeloma not having achieved remission: Secondary | ICD-10-CM

## 2021-05-02 DIAGNOSIS — Z7982 Long term (current) use of aspirin: Secondary | ICD-10-CM | POA: Diagnosis not present

## 2021-05-02 DIAGNOSIS — Z5112 Encounter for antineoplastic immunotherapy: Secondary | ICD-10-CM | POA: Diagnosis not present

## 2021-05-02 DIAGNOSIS — Z7189 Other specified counseling: Secondary | ICD-10-CM

## 2021-05-02 DIAGNOSIS — Z87891 Personal history of nicotine dependence: Secondary | ICD-10-CM | POA: Diagnosis not present

## 2021-05-02 DIAGNOSIS — Z79899 Other long term (current) drug therapy: Secondary | ICD-10-CM | POA: Diagnosis not present

## 2021-05-02 MED ORDER — SODIUM CHLORIDE 0.9 % IV SOLN
20.0000 mg | Freq: Once | INTRAVENOUS | Status: AC
  Administered 2021-05-02: 20 mg via INTRAVENOUS
  Filled 2021-05-02: qty 20

## 2021-05-02 MED ORDER — SODIUM CHLORIDE 0.9 % IV SOLN: Freq: Once | INTRAVENOUS | Status: AC

## 2021-05-02 MED ORDER — PROCHLORPERAZINE MALEATE 10 MG PO TABS
10.0000 mg | ORAL_TABLET | Freq: Once | ORAL | Status: AC
  Administered 2021-05-02: 10 mg via ORAL
  Filled 2021-05-02: qty 1

## 2021-05-02 MED ORDER — DEXTROSE 5 % IV SOLN
70.0000 mg | Freq: Once | INTRAVENOUS | Status: AC
  Administered 2021-05-02: 70 mg via INTRAVENOUS
  Filled 2021-05-02: qty 30

## 2021-05-02 NOTE — Telephone Encounter (Signed)
Scheduled follow-up appointment per 10/4 los. Patient is aware. 

## 2021-05-02 NOTE — Patient Instructions (Signed)
[facility]  Discharge Instructions: Thank you for choosing [facility] to provide your oncology and hematology care.   If you have a lab appointment with the Cancer Center, please go directly to the Cancer Center and check in at the registration area.   Wear comfortable clothing and clothing appropriate for easy access to any Portacath or PICC line.   We strive to give you quality time with your provider. You may need to reschedule your appointment if you arrive late (15 or more minutes).  Arriving late affects you and other patients whose appointments are after yours.  Also, if you miss three or more appointments without notifying the office, you may be dismissed from the clinic at the provider's discretion.      For prescription refill requests, have your pharmacy contact our office and allow 72 hours for refills to be completed.    Today you received the following chemotherapy and/or immunotherapy agents carfilzomib   To help prevent nausea and vomiting after your treatment, we encourage you to take your nausea medication as directed.  BELOW ARE SYMPTOMS THAT SHOULD BE REPORTED IMMEDIATELY: . *FEVER GREATER THAN 100.4 F (38 C) OR HIGHER . *CHILLS OR SWEATING . *NAUSEA AND VOMITING THAT IS NOT CONTROLLED WITH YOUR NAUSEA MEDICATION . *UNUSUAL SHORTNESS OF BREATH . *UNUSUAL BRUISING OR BLEEDING . *URINARY PROBLEMS (pain or burning when urinating, or frequent urination) . *BOWEL PROBLEMS (unusual diarrhea, constipation, pain near the anus) . TENDERNESS IN MOUTH AND THROAT WITH OR WITHOUT PRESENCE OF ULCERS (sore throat, sores in mouth, or a toothache) . UNUSUAL RASH, SWELLING OR PAIN  . UNUSUAL VAGINAL DISCHARGE OR ITCHING   Items with * indicate a potential emergency and should be followed up as soon as possible or go to the Emergency Department if any problems should occur.  Please show the CHEMOTHERAPY ALERT CARD or IMMUNOTHERAPY ALERT  CARD at check-in to the Emergency Department and triage nurse.  Should you have questions after your visit or need to cancel or reschedule your appointment, please contact [facility]  Dept: [phone]  and follow the prompts.  Office hours are 8:00 a.m. to 4:30 p.m. Monday - Friday. Please note that voicemails left after 4:00 p.m. may not be returned until the following business day.  We are closed weekends and major holidays. You have access to a nurse at all times for urgent questions. Please call the main number to the clinic Dept: [phone] and follow the prompts.   For any non-urgent questions, you may also contact your provider using MyChart. We now offer e-Visits for anyone 18 and older to request care online for non-urgent symptoms. For details visit [email or web address].   Also download the MyChart app! Go to the app store, search "MyChart", open the app, select [facility], and log in with your MyChart username and password.  Due to Covid, a mask is required upon entering the hospital/clinic. If you do not have a mask, one will be given to you upon arrival. For doctor visits, patients may have 1 support person aged 18 or older with them. For treatment visits, patients cannot have anyone with them due to current Covid guidelines and our immunocompromised population.   

## 2021-05-06 ENCOUNTER — Other Ambulatory Visit: Payer: Self-pay | Admitting: Hematology

## 2021-05-06 DIAGNOSIS — C9000 Multiple myeloma not having achieved remission: Secondary | ICD-10-CM

## 2021-05-06 DIAGNOSIS — Z7189 Other specified counseling: Secondary | ICD-10-CM

## 2021-05-07 ENCOUNTER — Encounter: Payer: Self-pay | Admitting: Hematology

## 2021-05-07 NOTE — Progress Notes (Signed)
[facility] FOLLOW-UP progress notes  DOS .05/01/2021  Patient Care Team: Lavone Orn, MD as PCP - General (Internal Medicine)  CHIEF COMPLAINTS/PURPOSE OF VISIT:  Multiple myeloma, for further evaluation/management  HISTORY OF PRESENTING ILLNESS:   Brian Levine 75 y.o. male was seen in follow-up for continued evaluation and management of his multiple myeloma. He had has completed 3 cycles of carfilzomib and is starting his 4th cycle today.  No overt carfilzomib related toxicities.   Is overall well without any significant toxicities.  No significant diarrhea.  Grade 1 muscle cramps. Grade 1 fatigue. Grade 1 dysguesia.  No fevers no chills no night sweats no new bone pains. No further hematuria or abdominal pain.  Labs today show CBC with hemoglobin of 9.7, platelets of 128 k WBC count of 2.8k with an ANC of 1.1k. CMP unremarkable serum kappa lambda free light chains from 04/10/2021 are within normal limits with normal ratio.    Oncology History  Multiple myeloma ([place])  01/30/2021 Initial Diagnosis   Multiple myeloma ([place])   02/06/2021 -  Chemotherapy   Patient is on Treatment Plan : MYELOMA NEWLY DIAGNOSED TRANSPLANT CANDIDATE Carfilzomib (20/36)/ Lenalidomide / Dexamethasone (40/20) (KRd) q28d       MEDICAL HISTORY:  Past Medical History:  Diagnosis Date   BPH (benign prostatic hyperplasia) 2004   Constipation    Other pancytopenia ([place]) 05/04/2013   Thrombocytopenia, unspecified ([place]) 08/17/2013   Weight loss 05/04/2013    SURGICAL HISTORY: Past Surgical History:  Procedure Laterality Date   APPENDECTOMY     COLONOSCOPY WITH PROPOFOL N/A 07/17/2015   Procedure: COLONOSCOPY WITH PROPOFOL;  Surgeon: Garlan Fair, MD;  Location: [facility];  Service: Endoscopy;  Laterality: N/A;   CYSTOSCOPY WITH FULGERATION N/A 11/08/2020   Procedure: CYSTOSCOPY WITH FULGERATION OF BLEEDERS;  Surgeon: Irine Seal, MD;  Location: [facility];  Service: Urology;   Laterality: N/A;   CYSTOSCOPY WITH FULGERATION N/A 12/01/2020   Procedure: CYSTOSCOPY WITH FULGERATION OF  PROSTATE;  Surgeon: Irine Seal, MD;  Location: [facility];  Service: Urology;  Laterality: N/A;   CYSTOSCOPY WITH INSERTION OF UROLIFT N/A 08/22/2015   Procedure: CYSTOSCOPY WITH INSERTION OF UROLIFT;  Surgeon: Irine Seal, MD;  Location: [facility];  Service: Urology;  Laterality: N/A;   CYSTOSCOPY WITH LITHOLAPAXY N/A 10/24/2020   Procedure: CYSTOSCOPY WITH LITHOLAPAXY;  Surgeon: Irine Seal, MD;  Location: [facility];  Service: Urology;  Laterality: N/A;  REQUESTING 1 HR FOR CASE   EYE SURGERY     right cataract surgery with lens implant   TESTICLE SURGERY Right 2011   hydrocele   TRANSURETHRAL RESECTION OF PROSTATE N/A 10/24/2020   Procedure: TRANSURETHRAL RESECTION OF THE PROSTATE (TURP);  Surgeon: Irine Seal, MD;  Location: [facility];  Service: Urology;  Laterality: N/A;    SOCIAL HISTORY: Social History   Socioeconomic History   Marital status: Married    Spouse name: Not on file   Number of children: Not on file   Years of education: Not on file   Highest education level: Not on file  Occupational History   Not on file  Tobacco Use   Smoking status: Former    Packs/day: 1.50    Years: 30.00    Pack years: 45.00    Types: Cigarettes    Quit date: 07/30/1995    Years since quitting: 25.7   Smokeless tobacco: Never  Vaping Use   Vaping Use: Never used  Substance and Sexual Activity   Alcohol use:  No   Drug use: No   Sexual activity: Not on file  Other Topics Concern   Not on file  Social History Narrative   Not on file   Social Determinants of Health   Financial Resource Strain: Not on file  Food Insecurity: Not on file  Transportation Needs: Not on file  Physical Activity: Not on file  Stress: Not on file  Social Connections: Not on file  Intimate Partner Violence: Not on file    FAMILY HISTORY: No family history on file.  ALLERGIES:  has No Known  Allergies.  MEDICATIONS:  Current Outpatient Medications  Medication Sig Dispense Refill   acetaminophen (TYLENOL) 325 MG tablet Take 650 mg by mouth every 6 (six) hours as needed (for pain.).     acyclovir (ZOVIRAX) 400 MG tablet Take 1 tablet (400 mg total) by mouth daily. 30 tablet 3   aspirin EC 81 MG tablet Take 1 tablet (81 mg total) by mouth daily. 100 tablet 3   calcium carbonate (TUMS - DOSED IN MG ELEMENTAL CALCIUM) 500 MG chewable tablet Chew 1 tablet by mouth daily.     cholecalciferol (VITAMIN D3) 25 MCG (1000 UNIT) tablet Take 5,000 Units by mouth daily.     dexamethasone (DECADRON) 4 MG tablet Take 10 tablets (3m) once on day 22. Repeat every 28 days for the first 4 cycles. Take with breakfast. 40 tablet 4   doxazosin (CARDURA) 8 MG tablet Take 8 mg by mouth daily in the afternoon.     finasteride (PROSCAR) 5 MG tablet Take 5 mg by mouth daily in the afternoon.     lenalidomide (REVLIMID) 15 MG capsule TAKE 1 CAPSULE BY MOUTH DAILY FOR 21 DAYS, HOLD 7 DAYS. REPEAT EVERY 28 DAYS. 21 capsule 0   lidocaine-prilocaine (EMLA) cream Apply to affected area once 30 g 3   ondansetron (ZOFRAN) 8 MG tablet Take 1 tablet (8 mg total) by mouth 2 (two) times daily as needed (Nausea or vomiting). 30 tablet 1   pravastatin (PRAVACHOL) 40 MG tablet Take 40 mg by mouth daily before supper.     prochlorperazine (COMPAZINE) 10 MG tablet Take 1 tablet (10 mg total) by mouth every 6 (six) hours as needed (Nausea or vomiting). 30 tablet 1   No current facility-administered medications for this visit.    REVIEW OF SYSTEMS:   .10 Point review of Systems was done is negative except as noted above.   PHYSICAL EXAMINATION: ECOG PERFORMANCE STATUS: 1 - Symptomatic but completely ambulatory  Vitals:   05/01/21 1035  BP: 138/62  Pulse: 66  Resp: 16  Temp: 98.1 F (36.7 C)  SpO2: 100%   Filed Weights   05/01/21 1035  Weight: 180 lb 3.2 oz (81.7 kg)   GENERAL:alert, in no acute distress and  comfortable SKIN: no acute rashes, no significant lesions EYES: conjunctiva are pink and non-injected, sclera anicteric OROPHARYNX: MMM, no exudates, no oropharyngeal erythema or ulceration NECK: supple, no JVD LYMPH:  no palpable lymphadenopathy in the cervical, axillary or inguinal regions LUNGS: clear to auscultation b/l with normal respiratory effort HEART: regular rate & rhythm ABDOMEN:  normoactive bowel sounds , non tender, not distended. Extremity: no pedal edema PSYCH: alert & oriented x 3 with fluent speech NEURO: no focal motor/sensory deficits  LABORATORY DATA:   . CBC Latest Ref Rng & Units 05/01/2021 04/17/2021 04/10/2021  WBC 4.0 - 10.5 K/uL 2.8(L) 4.2 3.2(L)  Hemoglobin 13.0 - 17.0 g/dL 9.7(L) 10.1(L) 10.2(L)  Hematocrit 39.0 - 52.0 %  28.8(L) 28.9(L) 29.2(L)  Platelets 150 - 400 K/uL 128(L) 70(L) 84(L)   . CMP Latest Ref Rng & Units 05/01/2021 04/17/2021 04/10/2021  Glucose 70 - 99 mg/dL 99 109(H) 124(H)  BUN 8 - 23 mg/dL _0 Creatinine 0.61 - 1.24 mg/dL 1.03 1.05 1.02  Sodium 135 - 145 mmol/L 136 137 137  Potassium 3.5 - 5.1 mmol/L 4.2 4.4 4.1  Chloride 98 - 111 mmol/L 105 105 104  CO2 22 - 32 mmol/L _1 Calcium 8.9 - 10.3 mg/dL 8.8(L) 8.6(L) 8.7(L)  Total Protein 6.5 - 8.1 g/dL 5.8(L) 5.8(L) 5.7(L)  Total Bilirubin 0.3 - 1.2 mg/dL 1.3(H) 1.6(H) 1.8(H)  Alkaline Phos 38 - 126 U/L 52 52 55  AST 15 - 41 U/L 15 11(L) 17  ALT 0 - 44 U/L _2 ASSESSMENT & PLAN:   75 yo with    1) Recurrent/persistent hematuria bleeding after prostate surgery. - resolved No fhx or previous personal h/o bleeding issues.  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 CMP unremarkable serum kappa lambda free light chains from 04/10/2021 are within normal limits with normal ratio. -No further toxicities from continuing current dose of carfilzomib. -Given good response and patient's mild diarrhea and muscle cramps will maintain Revlimid at 15 mg 3 weeks on 1 week off at this time. -Anticipate this will be his last planned cycle of KRD induction treatment --CT-guided bone marrow aspiration and biopsy in 3 weeks PET/CT in 3 weeks Referral to Dr. Aris Lot in 2 to 3 weeks for consideration of HDT-AutoHSCT -Continue on 2000 IU Vitamin D daily. -continue Zometa every 4 weeks  FOLLOW UP:  CT-guided bone marrow aspiration and biopsy in 3 weeks PET/CT in 3 weeks Referral to Dr. Aris Lot in 2 to 3 weeks for consideration of HDT-AutoHSCT Return to clinic with Dr. Irene Limbo in 4 weeks with port flush and labs Continue Zometa every 4 weeks    . The total time spent in the appointment was 40 minutes and more than 50% was on counseling and direct patient cares.  Ordering and management of KRD chemotherapy, ordering and arranging for bone marrow biopsy PET scan and coordination with referral to Iselin MD [facility]

## 2021-05-07 NOTE — Addendum Note (Signed)
Addended by: Sullivan Lone on: 05/07/2021 05:55 PM   Modules accepted: Orders

## 2021-05-08 ENCOUNTER — Other Ambulatory Visit: Payer: Self-pay

## 2021-05-08 ENCOUNTER — Ambulatory Visit: Payer: Medicare HMO

## 2021-05-08 ENCOUNTER — Inpatient Hospital Stay: Payer: Medicare HMO

## 2021-05-08 VITALS — BP 139/69 | HR 59 | Temp 97.8°F | Resp 18 | Wt 177.0 lb

## 2021-05-08 DIAGNOSIS — Z79899 Other long term (current) drug therapy: Secondary | ICD-10-CM | POA: Diagnosis not present

## 2021-05-08 DIAGNOSIS — Z5112 Encounter for antineoplastic immunotherapy: Secondary | ICD-10-CM | POA: Diagnosis not present

## 2021-05-08 DIAGNOSIS — C9000 Multiple myeloma not having achieved remission: Secondary | ICD-10-CM

## 2021-05-08 DIAGNOSIS — Z7982 Long term (current) use of aspirin: Secondary | ICD-10-CM | POA: Diagnosis not present

## 2021-05-08 DIAGNOSIS — Z7189 Other specified counseling: Secondary | ICD-10-CM

## 2021-05-08 DIAGNOSIS — Z87891 Personal history of nicotine dependence: Secondary | ICD-10-CM | POA: Diagnosis not present

## 2021-05-08 DIAGNOSIS — Z5111 Encounter for antineoplastic chemotherapy: Secondary | ICD-10-CM

## 2021-05-08 LAB — CBC WITH DIFFERENTIAL/PLATELET
Abs Immature Granulocytes: 0.01 10*3/uL (ref 0.00–0.07)
Basophils Absolute: 0 10*3/uL (ref 0.0–0.1)
Basophils Relative: 0 %
Eosinophils Absolute: 0.2 10*3/uL (ref 0.0–0.5)
Eosinophils Relative: 8 %
HCT: 29.7 % — ABNORMAL LOW (ref 39.0–52.0)
Hemoglobin: 10 g/dL — ABNORMAL LOW (ref 13.0–17.0)
Immature Granulocytes: 0 %
Lymphocytes Relative: 28 %
Lymphs Abs: 0.8 10*3/uL (ref 0.7–4.0)
MCH: 31.8 pg (ref 26.0–34.0)
MCHC: 33.7 g/dL (ref 30.0–36.0)
MCV: 94.6 fL (ref 80.0–100.0)
Monocytes Absolute: 0.2 10*3/uL (ref 0.1–1.0)
Monocytes Relative: 7 %
Neutro Abs: 1.6 10*3/uL — ABNORMAL LOW (ref 1.7–7.7)
Neutrophils Relative %: 57 %
Platelets: 79 10*3/uL — ABNORMAL LOW (ref 150–400)
RBC: 3.14 MIL/uL — ABNORMAL LOW (ref 4.22–5.81)
RDW: 14.5 % (ref 11.5–15.5)
WBC: 2.8 10*3/uL — ABNORMAL LOW (ref 4.0–10.5)
nRBC: 0 % (ref 0.0–0.2)

## 2021-05-08 LAB — CMP (CANCER CENTER ONLY)
ALT: 16 U/L (ref 0–44)
AST: 15 U/L (ref 15–41)
Albumin: 3.6 g/dL (ref 3.5–5.0)
Alkaline Phosphatase: 55 U/L (ref 38–126)
Anion gap: 9 (ref 5–15)
BUN: 12 mg/dL (ref 8–23)
CO2: 24 mmol/L (ref 22–32)
Calcium: 8.9 mg/dL (ref 8.9–10.3)
Chloride: 104 mmol/L (ref 98–111)
Creatinine: 1.11 mg/dL (ref 0.61–1.24)
GFR, Estimated: >60 mL/min
Glucose, Bld: 121 mg/dL — ABNORMAL HIGH (ref 70–99)
Potassium: 4.3 mmol/L (ref 3.5–5.1)
Sodium: 137 mmol/L (ref 135–145)
Total Bilirubin: 1.6 mg/dL — ABNORMAL HIGH (ref 0.3–1.2)
Total Protein: 5.9 g/dL — ABNORMAL LOW (ref 6.5–8.1)

## 2021-05-08 MED ORDER — SODIUM CHLORIDE 0.9 % IV SOLN
20.0000 mg | Freq: Once | INTRAVENOUS | Status: AC
  Administered 2021-05-08: 20 mg via INTRAVENOUS
  Filled 2021-05-08: qty 20

## 2021-05-08 MED ORDER — ZOLEDRONIC ACID 4 MG/100ML IV SOLN
4.0000 mg | Freq: Once | INTRAVENOUS | Status: AC
  Administered 2021-05-08: 4 mg via INTRAVENOUS
  Filled 2021-05-08: qty 100

## 2021-05-08 MED ORDER — DEXTROSE 5 % IV SOLN
35.0000 mg/m2 | Freq: Once | INTRAVENOUS | Status: AC
  Administered 2021-05-08: 70 mg via INTRAVENOUS
  Filled 2021-05-08: qty 30

## 2021-05-08 MED ORDER — SODIUM CHLORIDE 0.9 % IV SOLN
Freq: Once | INTRAVENOUS | Status: AC
Start: 2021-05-08 — End: 2021-05-08

## 2021-05-08 MED ORDER — SODIUM CHLORIDE 0.9 % IV SOLN: Freq: Once | INTRAVENOUS | Status: AC

## 2021-05-08 MED FILL — Dexamethasone Sodium Phosphate Inj 100 MG/10ML: INTRAMUSCULAR | Qty: 2 | Status: AC

## 2021-05-08 NOTE — Patient Instructions (Signed)
[facility] ONCOLOGY   Discharge Instructions: Thank you for choosing [facility] to provide your oncology and hematology care.   If you have a lab appointment with the [facility], please go directly to the [facility] and check in at the registration area.   Wear comfortable clothing and clothing appropriate for easy access to any Portacath or PICC line.   We strive to give you quality time with your provider. You may need to reschedule your appointment if you arrive late (15 or more minutes).  Arriving late affects you and other patients whose appointments are after yours.  Also, if you miss three or more appointments without notifying the office, you may be dismissed from the clinic at the provider's discretion.      For prescription refill requests, have your pharmacy contact our office and allow 72 hours for refills to be completed.    Today you received the following chemotherapy and/or immunotherapy agents: carfilzomib.      To help prevent nausea and vomiting after your treatment, we encourage you to take your nausea medication as directed.  BELOW ARE SYMPTOMS THAT SHOULD BE REPORTED IMMEDIATELY: *FEVER GREATER THAN 100.4 F (38 C) OR HIGHER *CHILLS OR SWEATING *NAUSEA AND VOMITING THAT IS NOT CONTROLLED WITH YOUR NAUSEA MEDICATION *UNUSUAL SHORTNESS OF BREATH *UNUSUAL BRUISING OR BLEEDING *URINARY PROBLEMS (pain or burning when urinating, or frequent urination) *BOWEL PROBLEMS (unusual diarrhea, constipation, pain near the anus) TENDERNESS IN MOUTH AND THROAT WITH OR WITHOUT PRESENCE OF ULCERS (sore throat, sores in mouth, or a toothache) UNUSUAL RASH, SWELLING OR PAIN  UNUSUAL VAGINAL DISCHARGE OR ITCHING   Items with * indicate a potential emergency and should be followed up as soon as possible or go to the Emergency Department if any problems should occur.  Please show the CHEMOTHERAPY ALERT CARD or IMMUNOTHERAPY ALERT CARD at  check-in to the Emergency Department and triage nurse.  Should you have questions after your visit or need to cancel or reschedule your appointment, please contact [facility]  Dept: [phone]  and follow the prompts.  Office hours are 8:00 a.m. to 4:30 p.m. Monday - Friday. Please note that voicemails left after 4:00 p.m. may not be returned until the following business day.  We are closed weekends and major holidays. You have access to a nurse at all times for urgent questions. Please call the main number to the clinic Dept: [phone] and follow the prompts.   For any non-urgent questions, you may also contact your provider using MyChart. We now offer e-Visits for anyone 83 and older to request care online for non-urgent symptoms. For details visit [email or web address].   Also download the MyChart app! Go to the app store, search "MyChart", open the app, select , and log in with your MyChart username and password.  Due to Covid, a mask is required upon entering the hospital/clinic. If you do not have a mask, one will be given to you upon arrival. For doctor visits, patients may have 1 support person aged 24 or older with them. For treatment visits, patients cannot have anyone with them due to current Covid guidelines and our immunocompromised population.   Zoledronic Acid Injection (Hypercalcemia, Oncology) What is this medication? ZOLEDRONIC ACID (ZOE le dron ik AS id) slows calcium loss from bones. It high calcium levels in the blood from some kinds of cancer. It may be used in other people at risk for bone loss. This medicine  may be used for other purposes; ask your health care provider or pharmacist if you have questions. COMMON BRAND NAME(S): Zometa What should I tell my care team before I take this medication? They need to know if you have any of these conditions: cancer dehydration dental disease kidney disease liver disease low levels  of calcium in the blood lung or breathing disease (asthma) receiving steroids like dexamethasone or prednisone an unusual or allergic reaction to zoledronic acid, other medicines, foods, dyes, or preservatives pregnant or trying to get pregnant breast-feeding How should I use this medication? This drug is injected into a vein. It is given by a health care provider in a hospital or clinic setting. Talk to your health care provider about the use of this drug in children. Special care may be needed. Overdosage: If you think you have taken too much of this medicine contact a poison control center or emergency room at once. NOTE: This medicine is only for you. Do not share this medicine with others. What if I miss a dose? Keep appointments for follow-up doses. It is important not to miss your dose. Call your health care provider if you are unable to keep an appointment. What may interact with this medication? certain antibiotics given by injection NSAIDs, medicines for pain and inflammation, like ibuprofen or naproxen some diuretics like bumetanide, furosemide teriparatide thalidomide This list may not describe all possible interactions. Give your health care provider a list of all the medicines, herbs, non-prescription drugs, or dietary supplements you use. Also tell them if you smoke, drink alcohol, or use illegal drugs. Some items may interact with your medicine. What should I watch for while using this medication? Visit your health care provider for regular checks on your progress. It may be some time before you see the benefit from this drug. Some people who take this drug have severe bone, joint, or muscle pain. This drug may also increase your risk for jaw problems or a broken thigh bone. Tell your health care provider right away if you have severe pain in your jaw, bones, joints, or muscles. Tell you health care provider if you have any pain that does not go away or that gets worse. Tell  your dentist and dental surgeon that you are taking this drug. You should not have major dental surgery while on this drug. See your dentist to have a dental exam and fix any dental problems before starting this drug. Take good care of your teeth while on this drug. Make sure you see your dentist for regular follow-up appointments. You should make sure you get enough calcium and vitamin D while you are taking this drug. Discuss the foods you eat and the vitamins you take with your health care provider. Check with your health care provider if you have severe diarrhea, nausea, and vomiting, or if you sweat a lot. The loss of too much body fluid may make it dangerous for you to take this drug. You may need blood work done while you are taking this drug. Do not become pregnant while taking this drug. Women should inform their health care provider if they wish to become pregnant or think they might be pregnant. There is potential for serious harm to an unborn child. Talk to your health care provider for more information. What side effects may I notice from receiving this medication? Side effects that you should report to your doctor or health care provider as soon as possible: allergic reactions (skin rash, itching or  hives; swelling of the face, lips, or tongue) bone pain infection (fever, chills, cough, sore throat, pain or trouble passing urine) jaw pain, especially after dental work joint pain kidney injury (trouble passing urine or change in the amount of urine) low blood pressure (dizziness; feeling faint or lightheaded, falls; unusually weak or tired) low calcium levels (fast heartbeat; muscle cramps or pain; pain, tingling, or numbness in the hands or feet; seizures) low magnesium levels (fast, irregular heartbeat; muscle cramp or pain; muscle weakness; tremors; seizures) low red blood cell counts (trouble breathing; feeling faint; lightheaded, falls; unusually weak or tired) muscle  pain redness, blistering, peeling, or loosening of the skin, including inside the mouth severe diarrhea swelling of the ankles, feet, hands trouble breathing Side effects that usually do not require medical attention (report to your doctor or health care provider if they continue or are bothersome): anxious constipation coughing depressed mood eye irritation, itching, or pain fever general ill feeling or flu-like symptoms nausea pain, redness, or irritation at site where injected trouble sleeping This list may not describe all possible side effects. Call your doctor for medical advice about side effects. You may report side effects to FDA at 1-800-FDA-1088. Where should I keep my medication? This drug is given in a hospital or clinic. It will not be stored at home. NOTE: This sheet is a summary. It may not cover all possible information. If you have questions about this medicine, talk to your doctor, pharmacist, or health care provider.  2022 Elsevier/Gold Standard (2019-04-29 09:13:00)

## 2021-05-08 NOTE — Progress Notes (Signed)
Per Dr Irene Limbo pt okay for tx today. MD aware of lab results from today.

## 2021-05-09 ENCOUNTER — Inpatient Hospital Stay: Payer: Medicare HMO

## 2021-05-09 VITALS — BP 163/68 | HR 68 | Temp 97.6°F | Resp 18

## 2021-05-09 DIAGNOSIS — Z87891 Personal history of nicotine dependence: Secondary | ICD-10-CM | POA: Diagnosis not present

## 2021-05-09 DIAGNOSIS — Z79899 Other long term (current) drug therapy: Secondary | ICD-10-CM | POA: Diagnosis not present

## 2021-05-09 DIAGNOSIS — C9000 Multiple myeloma not having achieved remission: Secondary | ICD-10-CM

## 2021-05-09 DIAGNOSIS — Z7189 Other specified counseling: Secondary | ICD-10-CM

## 2021-05-09 DIAGNOSIS — Z7982 Long term (current) use of aspirin: Secondary | ICD-10-CM | POA: Diagnosis not present

## 2021-05-09 DIAGNOSIS — Z5112 Encounter for antineoplastic immunotherapy: Secondary | ICD-10-CM | POA: Diagnosis not present

## 2021-05-09 LAB — KAPPA/LAMBDA LIGHT CHAINS
Kappa free light chain: 14.2 mg/L (ref 3.3–19.4)
Kappa, lambda light chain ratio: 1.25 (ref 0.26–1.65)
Lambda free light chains: 11.4 mg/L (ref 5.7–26.3)

## 2021-05-09 MED ORDER — SODIUM CHLORIDE 0.9 % IV SOLN: Freq: Once | INTRAVENOUS | Status: AC

## 2021-05-09 MED ORDER — PROCHLORPERAZINE MALEATE 10 MG PO TABS
10.0000 mg | ORAL_TABLET | Freq: Once | ORAL | Status: AC
  Administered 2021-05-09: 10 mg via ORAL
  Filled 2021-05-09: qty 1

## 2021-05-09 MED ORDER — SODIUM CHLORIDE 0.9 % IV SOLN
20.0000 mg | Freq: Once | INTRAVENOUS | Status: AC
  Administered 2021-05-09: 20 mg via INTRAVENOUS
  Filled 2021-05-09: qty 20

## 2021-05-09 MED ORDER — SODIUM CHLORIDE 0.9 % IV SOLN
Freq: Once | INTRAVENOUS | Status: AC
Start: 2021-05-09 — End: 2021-05-09

## 2021-05-09 MED ORDER — DEXTROSE 5 % IV SOLN
35.0000 mg/m2 | Freq: Once | INTRAVENOUS | Status: AC
  Administered 2021-05-09: 70 mg via INTRAVENOUS
  Filled 2021-05-09: qty 30

## 2021-05-09 NOTE — Patient Instructions (Signed)
[facility]  Discharge Instructions: Thank you for choosing [facility] to provide your oncology and hematology care.   If you have a lab appointment with the Cancer Center, please go directly to the Cancer Center and check in at the registration area.   Wear comfortable clothing and clothing appropriate for easy access to any Portacath or PICC line.   We strive to give you quality time with your provider. You may need to reschedule your appointment if you arrive late (15 or more minutes).  Arriving late affects you and other patients whose appointments are after yours.  Also, if you miss three or more appointments without notifying the office, you may be dismissed from the clinic at the provider's discretion.      For prescription refill requests, have your pharmacy contact our office and allow 72 hours for refills to be completed.    Today you received the following chemotherapy and/or immunotherapy agents: Kyprolis    To help prevent nausea and vomiting after your treatment, we encourage you to take your nausea medication as directed.  BELOW ARE SYMPTOMS THAT SHOULD BE REPORTED IMMEDIATELY: . *FEVER GREATER THAN 100.4 F (38 C) OR HIGHER . *CHILLS OR SWEATING . *NAUSEA AND VOMITING THAT IS NOT CONTROLLED WITH YOUR NAUSEA MEDICATION . *UNUSUAL SHORTNESS OF BREATH . *UNUSUAL BRUISING OR BLEEDING . *URINARY PROBLEMS (pain or burning when urinating, or frequent urination) . *BOWEL PROBLEMS (unusual diarrhea, constipation, pain near the anus) . TENDERNESS IN MOUTH AND THROAT WITH OR WITHOUT PRESENCE OF ULCERS (sore throat, sores in mouth, or a toothache) . UNUSUAL RASH, SWELLING OR PAIN  . UNUSUAL VAGINAL DISCHARGE OR ITCHING   Items with * indicate a potential emergency and should be followed up as soon as possible or go to the Emergency Department if any problems should occur.  Please show the CHEMOTHERAPY ALERT CARD or IMMUNOTHERAPY ALERT  CARD at check-in to the Emergency Department and triage nurse.  Should you have questions after your visit or need to cancel or reschedule your appointment, please contact [facility]  Dept: [phone]  and follow the prompts.  Office hours are 8:00 a.m. to 4:30 p.m. Monday - Friday. Please note that voicemails left after 4:00 p.m. may not be returned until the following business day.  We are closed weekends and major holidays. You have access to a nurse at all times for urgent questions. Please call the main number to the clinic Dept: [phone] and follow the prompts.   For any non-urgent questions, you may also contact your provider using MyChart. We now offer e-Visits for anyone 18 and older to request care online for non-urgent symptoms. For details visit [email or web address].   Also download the MyChart app! Go to the app store, search "MyChart", open the app, select [facility], and log in with your MyChart username and password.  Due to Covid, a mask is required upon entering the hospital/clinic. If you do not have a mask, one will be given to you upon arrival. For doctor visits, patients may have 1 support person aged 18 or older with them. For treatment visits, patients cannot have anyone with them due to current Covid guidelines and our immunocompromised population.   

## 2021-05-10 ENCOUNTER — Encounter: Payer: Self-pay | Admitting: Hematology

## 2021-05-10 ENCOUNTER — Other Ambulatory Visit: Payer: Self-pay

## 2021-05-14 LAB — MULTIPLE MYELOMA PANEL, SERUM
Albumin SerPl Elph-Mcnc: 3.5 g/dL (ref 2.9–4.4)
Albumin/Glob SerPl: 1.6 (ref 0.7–1.7)
Alpha 1: 0.3 g/dL (ref 0.0–0.4)
Alpha2 Glob SerPl Elph-Mcnc: 0.6 g/dL (ref 0.4–1.0)
B-Globulin SerPl Elph-Mcnc: 0.8 g/dL (ref 0.7–1.3)
Gamma Glob SerPl Elph-Mcnc: 0.5 g/dL (ref 0.4–1.8)
Globulin, Total: 2.2 g/dL (ref 2.2–3.9)
IgA: 73 mg/dL (ref 61–437)
IgG (Immunoglobin G), Serum: 501 mg/dL — ABNORMAL LOW (ref 603–1613)
IgM (Immunoglobulin M), Srm: 40 mg/dL (ref 15–143)
Total Protein ELP: 5.7 g/dL — ABNORMAL LOW (ref 6.0–8.5)

## 2021-05-14 MED FILL — Dexamethasone Sodium Phosphate Inj 100 MG/10ML: INTRAMUSCULAR | Qty: 2 | Status: AC

## 2021-05-15 ENCOUNTER — Other Ambulatory Visit: Payer: Self-pay

## 2021-05-15 ENCOUNTER — Inpatient Hospital Stay: Payer: Medicare HMO

## 2021-05-15 VITALS — BP 135/63 | HR 62 | Temp 98.2°F | Resp 18 | Wt 179.5 lb

## 2021-05-15 DIAGNOSIS — Z7982 Long term (current) use of aspirin: Secondary | ICD-10-CM | POA: Diagnosis not present

## 2021-05-15 DIAGNOSIS — C9000 Multiple myeloma not having achieved remission: Secondary | ICD-10-CM

## 2021-05-15 DIAGNOSIS — Z5112 Encounter for antineoplastic immunotherapy: Secondary | ICD-10-CM | POA: Diagnosis not present

## 2021-05-15 DIAGNOSIS — Z87891 Personal history of nicotine dependence: Secondary | ICD-10-CM | POA: Diagnosis not present

## 2021-05-15 DIAGNOSIS — Z7189 Other specified counseling: Secondary | ICD-10-CM

## 2021-05-15 DIAGNOSIS — Z79899 Other long term (current) drug therapy: Secondary | ICD-10-CM | POA: Diagnosis not present

## 2021-05-15 LAB — CBC WITH DIFFERENTIAL/PLATELET
Abs Immature Granulocytes: 0.01 10*3/uL (ref 0.00–0.07)
Basophils Absolute: 0 10*3/uL (ref 0.0–0.1)
Basophils Relative: 0 %
Eosinophils Absolute: 0.4 10*3/uL (ref 0.0–0.5)
Eosinophils Relative: 14 %
HCT: 30.3 % — ABNORMAL LOW (ref 39.0–52.0)
Hemoglobin: 10.4 g/dL — ABNORMAL LOW (ref 13.0–17.0)
Immature Granulocytes: 0 %
Lymphocytes Relative: 27 %
Lymphs Abs: 0.7 10*3/uL (ref 0.7–4.0)
MCH: 31.8 pg (ref 26.0–34.0)
MCHC: 34.3 g/dL (ref 30.0–36.0)
MCV: 92.7 fL (ref 80.0–100.0)
Monocytes Absolute: 0.3 10*3/uL (ref 0.1–1.0)
Monocytes Relative: 13 %
Neutro Abs: 1.2 10*3/uL — ABNORMAL LOW (ref 1.7–7.7)
Neutrophils Relative %: 46 %
Platelets: 83 10*3/uL — ABNORMAL LOW (ref 150–400)
RBC: 3.27 MIL/uL — ABNORMAL LOW (ref 4.22–5.81)
RDW: 14 % (ref 11.5–15.5)
WBC: 2.7 10*3/uL — ABNORMAL LOW (ref 4.0–10.5)
nRBC: 0 % (ref 0.0–0.2)

## 2021-05-15 LAB — CMP (CANCER CENTER ONLY)
ALT: 16 U/L (ref 0–44)
AST: 13 U/L — ABNORMAL LOW (ref 15–41)
Albumin: 3.6 g/dL (ref 3.5–5.0)
Alkaline Phosphatase: 54 U/L (ref 38–126)
Anion gap: 12 (ref 5–15)
BUN: 11 mg/dL (ref 8–23)
CO2: 22 mmol/L (ref 22–32)
Calcium: 9.1 mg/dL (ref 8.9–10.3)
Chloride: 107 mmol/L (ref 98–111)
Creatinine: 1.09 mg/dL (ref 0.61–1.24)
GFR, Estimated: >60 mL/min (ref >60)
Glucose, Bld: 106 mg/dL — ABNORMAL HIGH (ref 70–99)
Potassium: 3.8 mmol/L (ref 3.5–5.1)
Sodium: 141 mmol/L (ref 135–145)
Total Bilirubin: 1.6 mg/dL — ABNORMAL HIGH (ref 0.3–1.2)
Total Protein: 6.1 g/dL — ABNORMAL LOW (ref 6.5–8.1)

## 2021-05-15 MED ORDER — SODIUM CHLORIDE 0.9 % IV SOLN: Freq: Once | INTRAVENOUS | Status: AC

## 2021-05-15 MED ORDER — SODIUM CHLORIDE 0.9 % IV SOLN: Freq: Once | INTRAVENOUS | Status: DC

## 2021-05-15 MED ORDER — DEXTROSE 5 % IV SOLN
35.0000 mg/m2 | Freq: Once | INTRAVENOUS | Status: AC
  Administered 2021-05-15: 70 mg via INTRAVENOUS
  Filled 2021-05-15: qty 30

## 2021-05-15 MED ORDER — SODIUM CHLORIDE 0.9 % IV SOLN
20.0000 mg | Freq: Once | INTRAVENOUS | Status: AC
  Administered 2021-05-15: 20 mg via INTRAVENOUS
  Filled 2021-05-15: qty 20

## 2021-05-15 MED FILL — Dexamethasone Sodium Phosphate Inj 100 MG/10ML: INTRAMUSCULAR | Qty: 2 | Status: AC

## 2021-05-15 NOTE — Progress Notes (Signed)
Ok to treat today with Anc of 1.2 and Plt Count of 83 per Dr. Irene Limbo

## 2021-05-15 NOTE — Patient Instructions (Signed)
[facility]   Discharge Instructions: Thank you for choosing [facility] to provide your oncology and hematology care.   If you have a lab appointment with the [facility], please go directly to the [facility] and check in at the registration area.   Wear comfortable clothing and clothing appropriate for easy access to any Portacath or PICC line.   We strive to give you quality time with your provider. You may need to reschedule your appointment if you arrive late (15 or more minutes).  Arriving late affects you and other patients whose appointments are after yours.  Also, if you miss three or more appointments without notifying the office, you may be dismissed from the clinic at the provider's discretion.      For prescription refill requests, have your pharmacy contact our office and allow 72 hours for refills to be completed.    Today you received the following chemotherapy and/or immunotherapy agents: carfilzomib.      To help prevent nausea and vomiting after your treatment, we encourage you to take your nausea medication as directed.  BELOW ARE SYMPTOMS THAT SHOULD BE REPORTED IMMEDIATELY: *FEVER GREATER THAN 100.4 F (38 C) OR HIGHER *CHILLS OR SWEATING *NAUSEA AND VOMITING THAT IS NOT CONTROLLED WITH YOUR NAUSEA MEDICATION *UNUSUAL SHORTNESS OF BREATH *UNUSUAL BRUISING OR BLEEDING *URINARY PROBLEMS (pain or burning when urinating, or frequent urination) *BOWEL PROBLEMS (unusual diarrhea, constipation, pain near the anus) TENDERNESS IN MOUTH AND THROAT WITH OR WITHOUT PRESENCE OF ULCERS (sore throat, sores in mouth, or a toothache) UNUSUAL RASH, SWELLING OR PAIN  UNUSUAL VAGINAL DISCHARGE OR ITCHING   Items with * indicate a potential emergency and should be followed up as soon as possible or go to the Emergency Department if any problems should occur.  Please show the CHEMOTHERAPY ALERT CARD or IMMUNOTHERAPY ALERT CARD at  check-in to the Emergency Department and triage nurse.  Should you have questions after your visit or need to cancel or reschedule your appointment, please contact [facility]  Dept: [phone]  and follow the prompts.  Office hours are 8:00 a.m. to 4:30 p.m. Monday - Friday. Please note that voicemails left after 4:00 p.m. may not be returned until the following business day.  We are closed weekends and major holidays. You have access to a nurse at all times for urgent questions. Please call the main number to the clinic Dept: [phone] and follow the prompts.   For any non-urgent questions, you may also contact your provider using MyChart. We now offer e-Visits for anyone 51 and older to request care online for non-urgent symptoms. For details visit [email or web address].   Also download the MyChart app! Go to the app store, search "MyChart", open the app, select [facility], and log in with your MyChart username and password.  Due to Covid, a mask is required upon entering the hospital/clinic. If you do not have a mask, one will be given to you upon arrival. For doctor visits, patients may have 1 support person aged 23 or older with them. For treatment visits, patients cannot have anyone with them due to current Covid guidelines and our immunocompromised population.   Zoledronic Acid Injection (Hypercalcemia, Oncology) What is this medication? ZOLEDRONIC ACID (ZOE le dron ik AS id) slows calcium loss from bones. It high calcium levels in the blood from some kinds of cancer. It may be used in other people at risk for bone loss. This medicine  may be used for other purposes; ask your health care provider or pharmacist if you have questions. COMMON BRAND NAME(S): Zometa What should I tell my care team before I take this medication? They need to know if you have any of these conditions: cancer dehydration dental disease kidney disease liver disease low levels  of calcium in the blood lung or breathing disease (asthma) receiving steroids like dexamethasone or prednisone an unusual or allergic reaction to zoledronic acid, other medicines, foods, dyes, or preservatives pregnant or trying to get pregnant breast-feeding How should I use this medication? This drug is injected into a vein. It is given by a health care provider in a hospital or clinic setting. Talk to your health care provider about the use of this drug in children. Special care may be needed. Overdosage: If you think you have taken too much of this medicine contact a poison control center or emergency room at once. NOTE: This medicine is only for you. Do not share this medicine with others. What if I miss a dose? Keep appointments for follow-up doses. It is important not to miss your dose. Call your health care provider if you are unable to keep an appointment. What may interact with this medication? certain antibiotics given by injection NSAIDs, medicines for pain and inflammation, like ibuprofen or naproxen some diuretics like bumetanide, furosemide teriparatide thalidomide This list may not describe all possible interactions. Give your health care provider a list of all the medicines, herbs, non-prescription drugs, or dietary supplements you use. Also tell them if you smoke, drink alcohol, or use illegal drugs. Some items may interact with your medicine. What should I watch for while using this medication? Visit your health care provider for regular checks on your progress. It may be some time before you see the benefit from this drug. Some people who take this drug have severe bone, joint, or muscle pain. This drug may also increase your risk for jaw problems or a broken thigh bone. Tell your health care provider right away if you have severe pain in your jaw, bones, joints, or muscles. Tell you health care provider if you have any pain that does not go away or that gets worse. Tell  your dentist and dental surgeon that you are taking this drug. You should not have major dental surgery while on this drug. See your dentist to have a dental exam and fix any dental problems before starting this drug. Take good care of your teeth while on this drug. Make sure you see your dentist for regular follow-up appointments. You should make sure you get enough calcium and vitamin D while you are taking this drug. Discuss the foods you eat and the vitamins you take with your health care provider. Check with your health care provider if you have severe diarrhea, nausea, and vomiting, or if you sweat a lot. The loss of too much body fluid may make it dangerous for you to take this drug. You may need blood work done while you are taking this drug. Do not become pregnant while taking this drug. Women should inform their health care provider if they wish to become pregnant or think they might be pregnant. There is potential for serious harm to an unborn child. Talk to your health care provider for more information. What side effects may I notice from receiving this medication? Side effects that you should report to your doctor or health care provider as soon as possible: allergic reactions (skin rash, itching or  hives; swelling of the face, lips, or tongue) bone pain infection (fever, chills, cough, sore throat, pain or trouble passing urine) jaw pain, especially after dental work joint pain kidney injury (trouble passing urine or change in the amount of urine) low blood pressure (dizziness; feeling faint or lightheaded, falls; unusually weak or tired) low calcium levels (fast heartbeat; muscle cramps or pain; pain, tingling, or numbness in the hands or feet; seizures) low magnesium levels (fast, irregular heartbeat; muscle cramp or pain; muscle weakness; tremors; seizures) low red blood cell counts (trouble breathing; feeling faint; lightheaded, falls; unusually weak or tired) muscle  pain redness, blistering, peeling, or loosening of the skin, including inside the mouth severe diarrhea swelling of the ankles, feet, hands trouble breathing Side effects that usually do not require medical attention (report to your doctor or health care provider if they continue or are bothersome): anxious constipation coughing depressed mood eye irritation, itching, or pain fever general ill feeling or flu-like symptoms nausea pain, redness, or irritation at site where injected trouble sleeping This list may not describe all possible side effects. Call your doctor for medical advice about side effects. You may report side effects to FDA at 1-800-FDA-1088. Where should I keep my medication? This drug is given in a hospital or clinic. It will not be stored at home. NOTE: This sheet is a summary. It may not cover all possible information. If you have questions about this medicine, talk to your doctor, pharmacist, or health care provider.  2022 Elsevier/Gold Standard (2019-04-29 09:13:00)

## 2021-05-16 ENCOUNTER — Inpatient Hospital Stay: Payer: Medicare HMO

## 2021-05-16 VITALS — BP 140/65 | HR 61 | Temp 97.8°F | Resp 18

## 2021-05-16 DIAGNOSIS — Z79899 Other long term (current) drug therapy: Secondary | ICD-10-CM | POA: Diagnosis not present

## 2021-05-16 DIAGNOSIS — Z7189 Other specified counseling: Secondary | ICD-10-CM

## 2021-05-16 DIAGNOSIS — Z87891 Personal history of nicotine dependence: Secondary | ICD-10-CM | POA: Diagnosis not present

## 2021-05-16 DIAGNOSIS — Z7982 Long term (current) use of aspirin: Secondary | ICD-10-CM | POA: Diagnosis not present

## 2021-05-16 DIAGNOSIS — C9000 Multiple myeloma not having achieved remission: Secondary | ICD-10-CM

## 2021-05-16 DIAGNOSIS — Z5112 Encounter for antineoplastic immunotherapy: Secondary | ICD-10-CM | POA: Diagnosis not present

## 2021-05-16 MED ORDER — PROCHLORPERAZINE MALEATE 10 MG PO TABS
10.0000 mg | ORAL_TABLET | Freq: Once | ORAL | Status: AC
  Administered 2021-05-16: 10 mg via ORAL
  Filled 2021-05-16: qty 1

## 2021-05-16 MED ORDER — SODIUM CHLORIDE 0.9 % IV SOLN
20.0000 mg | Freq: Once | INTRAVENOUS | Status: DC
  Filled 2021-05-16: qty 2

## 2021-05-16 MED ORDER — SODIUM CHLORIDE 0.9 % IV SOLN
20.0000 mg | Freq: Once | INTRAVENOUS | Status: DC
  Administered 2021-05-16: 20 mg via INTRAVENOUS
  Filled 2021-05-16: qty 20

## 2021-05-16 MED ORDER — DEXTROSE 5 % IV SOLN
35.0000 mg/m2 | Freq: Once | INTRAVENOUS | Status: AC
  Administered 2021-05-16: 70 mg via INTRAVENOUS
  Filled 2021-05-16: qty 30

## 2021-05-16 MED ORDER — SODIUM CHLORIDE 0.9 % IV SOLN: Freq: Once | INTRAVENOUS | Status: AC

## 2021-05-16 NOTE — Patient Instructions (Signed)
[facility]  Discharge Instructions: Thank you for choosing [facility] to provide your oncology and hematology care.   If you have a lab appointment with the [facility], please go directly to the [facility] and check in at the registration area.   Wear comfortable clothing and clothing appropriate for easy access to any Portacath or PICC line.   We strive to give you quality time with your provider. You may need to reschedule your appointment if you arrive late (15 or more minutes).  Arriving late affects you and other patients whose appointments are after yours.  Also, if you miss three or more appointments without notifying the office, you may be dismissed from the clinic at the provider's discretion.      For prescription refill requests, have your pharmacy contact our office and allow 72 hours for refills to be completed.    Today you received the following chemotherapy and/or immunotherapy agents: Carfilzomib (kyprolis).      To help prevent nausea and vomiting after your treatment, we encourage you to take your nausea medication as directed.  BELOW ARE SYMPTOMS THAT SHOULD BE REPORTED IMMEDIATELY: *FEVER GREATER THAN 100.4 F (38 C) OR HIGHER *CHILLS OR SWEATING *NAUSEA AND VOMITING THAT IS NOT CONTROLLED WITH YOUR NAUSEA MEDICATION *UNUSUAL SHORTNESS OF BREATH *UNUSUAL BRUISING OR BLEEDING *URINARY PROBLEMS (pain or burning when urinating, or frequent urination) *BOWEL PROBLEMS (unusual diarrhea, constipation, pain near the anus) TENDERNESS IN MOUTH AND THROAT WITH OR WITHOUT PRESENCE OF ULCERS (sore throat, sores in mouth, or a toothache) UNUSUAL RASH, SWELLING OR PAIN  UNUSUAL VAGINAL DISCHARGE OR ITCHING   Items with * indicate a potential emergency and should be followed up as soon as possible or go to the Emergency Department if any problems should occur.  Please show the CHEMOTHERAPY ALERT CARD or IMMUNOTHERAPY ALERT CARD  at check-in to the Emergency Department and triage nurse.  Should you have questions after your visit or need to cancel or reschedule your appointment, please contact [facility]  Dept: [phone]  and follow the prompts.  Office hours are 8:00 a.m. to 4:30 p.m. Monday - Friday. Please note that voicemails left after 4:00 p.m. may not be returned until the following business day.  We are closed weekends and major holidays. You have access to a nurse at all times for urgent questions. Please call the main number to the clinic Dept: [phone] and follow the prompts.   For any non-urgent questions, you may also contact your provider using MyChart. We now offer e-Visits for anyone 67 and older to request care online for non-urgent symptoms. For details visit [email or web address].   Also download the MyChart app! Go to the app store, search "MyChart", open the app, select [facility], and log in with your MyChart username and password.  Due to Covid, a mask is required upon entering the hospital/clinic. If you do not have a mask, one will be given to you upon arrival. For doctor visits, patients may have 1 support person aged 66 or older with them. For treatment visits, patients cannot have anyone with them due to current Covid guidelines and our immunocompromised population.

## 2021-05-18 ENCOUNTER — Other Ambulatory Visit: Payer: Self-pay

## 2021-05-18 ENCOUNTER — Ambulatory Visit (HOSPITAL_COMMUNITY)
Admission: RE | Admit: 2021-05-18 | Discharge: 2021-05-18 | Disposition: A | Discharge status: Home / Self Care | Payer: Medicare HMO | Source: Ambulatory Visit | Attending: Hematology | Admitting: Hematology

## 2021-05-18 DIAGNOSIS — I700 Atherosclerosis of aorta: Secondary | ICD-10-CM | POA: Diagnosis not present

## 2021-05-18 DIAGNOSIS — N400 Enlarged prostate without lower urinary tract symptoms: Secondary | ICD-10-CM | POA: Diagnosis not present

## 2021-05-18 DIAGNOSIS — C9000 Multiple myeloma not having achieved remission: Secondary | ICD-10-CM | POA: Diagnosis not present

## 2021-05-18 DIAGNOSIS — Z5111 Encounter for antineoplastic chemotherapy: Secondary | ICD-10-CM

## 2021-05-18 LAB — GLUCOSE, CAPILLARY: Glucose-Capillary: 88 mg/dL (ref 70–99)

## 2021-05-18 MED ORDER — FLUDEOXYGLUCOSE F - 18 (FDG) INJECTION
9.0000 | Freq: Once | INTRAVENOUS | Status: AC
  Administered 2021-05-18: 9 via INTRAVENOUS

## 2021-05-28 ENCOUNTER — Inpatient Hospital Stay: Payer: Medicare HMO

## 2021-05-28 ENCOUNTER — Other Ambulatory Visit: Payer: Self-pay

## 2021-05-28 ENCOUNTER — Inpatient Hospital Stay (HOSPITAL_BASED_OUTPATIENT_CLINIC_OR_DEPARTMENT_OTHER): Payer: Medicare HMO | Admitting: Hematology

## 2021-05-28 VITALS — BP 150/54 | HR 57 | Temp 98.1°F | Resp 18 | Wt 180.1 lb

## 2021-05-28 DIAGNOSIS — C9000 Multiple myeloma not having achieved remission: Secondary | ICD-10-CM

## 2021-05-28 DIAGNOSIS — Z79899 Other long term (current) drug therapy: Secondary | ICD-10-CM | POA: Diagnosis not present

## 2021-05-28 DIAGNOSIS — Z5112 Encounter for antineoplastic immunotherapy: Secondary | ICD-10-CM | POA: Diagnosis not present

## 2021-05-28 DIAGNOSIS — Z87891 Personal history of nicotine dependence: Secondary | ICD-10-CM | POA: Diagnosis not present

## 2021-05-28 DIAGNOSIS — Z7982 Long term (current) use of aspirin: Secondary | ICD-10-CM | POA: Diagnosis not present

## 2021-05-28 DIAGNOSIS — Z5111 Encounter for antineoplastic chemotherapy: Secondary | ICD-10-CM

## 2021-05-28 LAB — CMP (CANCER CENTER ONLY)
ALT: 14 U/L (ref 0–44)
AST: 13 U/L — ABNORMAL LOW (ref 15–41)
Albumin: 3.4 g/dL — ABNORMAL LOW (ref 3.5–5.0)
Alkaline Phosphatase: 46 U/L (ref 38–126)
Anion gap: 7 (ref 5–15)
BUN: 13 mg/dL (ref 8–23)
CO2: 25 mmol/L (ref 22–32)
Calcium: 8.3 mg/dL — ABNORMAL LOW (ref 8.9–10.3)
Chloride: 106 mmol/L (ref 98–111)
Creatinine: 1.14 mg/dL (ref 0.61–1.24)
GFR, Estimated: >60 mL/min (ref >60)
Glucose, Bld: 120 mg/dL — ABNORMAL HIGH (ref 70–99)
Potassium: 3.8 mmol/L (ref 3.5–5.1)
Sodium: 138 mmol/L (ref 135–145)
Total Bilirubin: 1.1 mg/dL (ref 0.3–1.2)
Total Protein: 5.5 g/dL — ABNORMAL LOW (ref 6.5–8.1)

## 2021-05-28 LAB — CBC WITH DIFFERENTIAL/PLATELET
Abs Immature Granulocytes: 0 10*3/uL (ref 0.00–0.07)
Basophils Absolute: 0 10*3/uL (ref 0.0–0.1)
Basophils Relative: 1 %
Eosinophils Absolute: 0.1 10*3/uL (ref 0.0–0.5)
Eosinophils Relative: 3 %
HCT: 28.4 % — ABNORMAL LOW (ref 39.0–52.0)
Hemoglobin: 9.7 g/dL — ABNORMAL LOW (ref 13.0–17.0)
Immature Granulocytes: 0 %
Lymphocytes Relative: 32 %
Lymphs Abs: 0.8 10*3/uL (ref 0.7–4.0)
MCH: 31.5 pg (ref 26.0–34.0)
MCHC: 34.2 g/dL (ref 30.0–36.0)
MCV: 92.2 fL (ref 80.0–100.0)
Monocytes Absolute: 0.4 10*3/uL (ref 0.1–1.0)
Monocytes Relative: 16 %
Neutro Abs: 1.2 10*3/uL — ABNORMAL LOW (ref 1.7–7.7)
Neutrophils Relative %: 48 %
Platelets: 114 10*3/uL — ABNORMAL LOW (ref 150–400)
RBC: 3.08 MIL/uL — ABNORMAL LOW (ref 4.22–5.81)
RDW: 14 % (ref 11.5–15.5)
WBC: 2.5 10*3/uL — ABNORMAL LOW (ref 4.0–10.5)
nRBC: 0 % (ref 0.0–0.2)

## 2021-05-29 ENCOUNTER — Telehealth: Payer: Self-pay | Admitting: Hematology

## 2021-05-29 NOTE — Telephone Encounter (Signed)
Scheduled follow-up appointment per 10/31 los. Patient is aware. 

## 2021-05-30 ENCOUNTER — Other Ambulatory Visit: Payer: Self-pay | Admitting: Radiology

## 2021-05-30 ENCOUNTER — Other Ambulatory Visit: Payer: Self-pay | Admitting: Hematology

## 2021-05-30 DIAGNOSIS — Z7189 Other specified counseling: Secondary | ICD-10-CM

## 2021-05-30 DIAGNOSIS — C9000 Multiple myeloma not having achieved remission: Secondary | ICD-10-CM

## 2021-06-01 ENCOUNTER — Other Ambulatory Visit: Payer: Self-pay

## 2021-06-01 ENCOUNTER — Ambulatory Visit (HOSPITAL_COMMUNITY)
Admission: RE | Admit: 2021-06-01 | Discharge: 2021-06-01 | Disposition: A | Discharge status: Home / Self Care | Payer: Medicare HMO | Source: Ambulatory Visit | Attending: Hematology | Admitting: Hematology

## 2021-06-01 ENCOUNTER — Encounter (HOSPITAL_COMMUNITY): Payer: Self-pay

## 2021-06-01 DIAGNOSIS — D61818 Other pancytopenia: Secondary | ICD-10-CM | POA: Insufficient documentation

## 2021-06-01 DIAGNOSIS — C9000 Multiple myeloma not having achieved remission: Secondary | ICD-10-CM | POA: Diagnosis not present

## 2021-06-01 DIAGNOSIS — Z79899 Other long term (current) drug therapy: Secondary | ICD-10-CM | POA: Diagnosis not present

## 2021-06-01 DIAGNOSIS — Z7982 Long term (current) use of aspirin: Secondary | ICD-10-CM | POA: Insufficient documentation

## 2021-06-01 DIAGNOSIS — Z5111 Encounter for antineoplastic chemotherapy: Secondary | ICD-10-CM

## 2021-06-01 LAB — CBC WITH DIFFERENTIAL/PLATELET
Abs Immature Granulocytes: 0.01 10*3/uL (ref 0.00–0.07)
Basophils Absolute: 0 10*3/uL (ref 0.0–0.1)
Basophils Relative: 1 %
Eosinophils Absolute: 0.2 10*3/uL (ref 0.0–0.5)
Eosinophils Relative: 7 %
HCT: 31 % — ABNORMAL LOW (ref 39.0–52.0)
Hemoglobin: 10.4 g/dL — ABNORMAL LOW (ref 13.0–17.0)
Immature Granulocytes: 1 %
Lymphocytes Relative: 30 %
Lymphs Abs: 0.6 10*3/uL — ABNORMAL LOW (ref 0.7–4.0)
MCH: 31.8 pg (ref 26.0–34.0)
MCHC: 33.5 g/dL (ref 30.0–36.0)
MCV: 94.8 fL (ref 80.0–100.0)
Monocytes Absolute: 0.4 10*3/uL (ref 0.1–1.0)
Monocytes Relative: 16 %
Neutro Abs: 1 10*3/uL — ABNORMAL LOW (ref 1.7–7.7)
Neutrophils Relative %: 45 %
Platelets: 135 10*3/uL — ABNORMAL LOW (ref 150–400)
RBC: 3.27 MIL/uL — ABNORMAL LOW (ref 4.22–5.81)
RDW: 14.2 % (ref 11.5–15.5)
WBC: 2.2 10*3/uL — ABNORMAL LOW (ref 4.0–10.5)
nRBC: 0 % (ref 0.0–0.2)

## 2021-06-01 MED ORDER — FENTANYL CITRATE (PF) 100 MCG/2ML IJ SOLN
INTRAMUSCULAR | Status: DC | PRN
  Administered 2021-06-01: 50 ug via INTRAVENOUS

## 2021-06-01 MED ORDER — FENTANYL CITRATE (PF) 100 MCG/2ML IJ SOLN
INTRAMUSCULAR | Status: AC | PRN
  Administered 2021-06-01: 50 ug via INTRAVENOUS

## 2021-06-01 MED ORDER — LIDOCAINE HCL (PF) 1 % IJ SOLN
INTRAMUSCULAR | Status: AC | PRN
  Administered 2021-06-01: 10 mL via INTRADERMAL

## 2021-06-01 MED ORDER — MIDAZOLAM HCL 2 MG/2ML IJ SOLN
INTRAMUSCULAR | Status: AC | PRN
  Administered 2021-06-01: 1 mg via INTRAVENOUS

## 2021-06-01 MED ORDER — FENTANYL CITRATE (PF) 100 MCG/2ML IJ SOLN
INTRAMUSCULAR | Status: AC
  Filled 2021-06-01: qty 2

## 2021-06-01 MED ORDER — SODIUM CHLORIDE 0.9 % IV SOLN: INTRAVENOUS | Status: DC

## 2021-06-01 MED ORDER — MIDAZOLAM HCL 2 MG/2ML IJ SOLN
INTRAMUSCULAR | Status: AC
  Filled 2021-06-01: qty 2

## 2021-06-01 NOTE — H&P (Signed)
Referring Physician(s): Brunetta Genera  Supervising Physician: Jacqulynn Cadet  Patient Status:  Brian Levine  Chief Complaint:  "I'm here for another bone marrow biopsy"  Subjective: Patient known to IR service from bone marrow biopsy on 01/04/2021.  He has a history of multiple myeloma and presents again today for follow-up CT-guided bone marrow biopsy to assess treatment response.  He currently denies fever, headache, chest pain, dyspnea, cough, abdominal/back pain, nausea, vomiting or bleeding.  Additional medical history as below.  Past Medical History:  Diagnosis Date   BPH (benign prostatic hyperplasia) 2004   Constipation    Other pancytopenia ([facility]) 05/04/2013   Thrombocytopenia, unspecified ([facility]) 08/17/2013   Weight loss 05/04/2013   Past Surgical History:  Procedure Laterality Date   APPENDECTOMY     COLONOSCOPY WITH PROPOFOL N/A 07/17/2015   Procedure: COLONOSCOPY WITH PROPOFOL;  Surgeon: Garlan Fair, MD;  Location: [facility];  Service: Endoscopy;  Laterality: N/A;   CYSTOSCOPY WITH FULGERATION N/A 11/08/2020   Procedure: CYSTOSCOPY WITH FULGERATION OF BLEEDERS;  Surgeon: Irine Seal, MD;  Location: [facility];  Service: Urology;  Laterality: N/A;   CYSTOSCOPY WITH FULGERATION N/A 12/01/2020   Procedure: CYSTOSCOPY WITH FULGERATION OF  PROSTATE;  Surgeon: Irine Seal, MD;  Location: [facility];  Service: Urology;  Laterality: N/A;   CYSTOSCOPY WITH INSERTION OF UROLIFT N/A 08/22/2015   Procedure: CYSTOSCOPY WITH INSERTION OF UROLIFT;  Surgeon: Irine Seal, MD;  Location: [facility];  Service: Urology;  Laterality: N/A;   CYSTOSCOPY WITH LITHOLAPAXY N/A 10/24/2020   Procedure: CYSTOSCOPY WITH LITHOLAPAXY;  Surgeon: Irine Seal, MD;  Location: [facility];  Service: Urology;  Laterality: N/A;  REQUESTING 1 HR FOR CASE   EYE SURGERY     right cataract surgery with lens implant   TESTICLE SURGERY Right 2011   hydrocele   TRANSURETHRAL RESECTION OF PROSTATE N/A 10/24/2020   Procedure:  TRANSURETHRAL RESECTION OF THE PROSTATE (TURP);  Surgeon: Irine Seal, MD;  Location: [facility];  Service: Urology;  Laterality: N/A;      Allergies: Patient has no known allergies.  Medications: Prior to Admission medications   Medication Sig Start Date End Date Taking? Authorizing Provider  acyclovir (ZOVIRAX) 400 MG tablet TAKE 1 TABLET(400 MG) BY MOUTH DAILY 05/30/21  Yes Brunetta Genera, MD  calcium carbonate (TUMS - DOSED IN MG ELEMENTAL CALCIUM) 500 MG chewable tablet Chew 1 tablet by mouth daily.   Yes [provider]  cholecalciferol (VITAMIN D3) 25 MCG (1000 UNIT) tablet Take 5,000 Units by mouth daily.   Yes [provider]  doxazosin (CARDURA) 8 MG tablet Take 8 mg by mouth daily in the afternoon.   Yes [provider]  finasteride (PROSCAR) 5 MG tablet Take 5 mg by mouth daily in the afternoon.   Yes [provider]  lenalidomide (REVLIMID) 15 MG capsule TAKE 1 CAPSULE BY MOUTH EVERY DAY FOR 21 DAYS THEN OFF FOR 7 DAYS EVERY 28 DAY CYCLE 05/10/21  Yes Brunetta Genera, MD  pravastatin (PRAVACHOL) 40 MG tablet Take 40 mg by mouth daily before supper.   Yes [provider]  acetaminophen (TYLENOL) 325 MG tablet Take 650 mg by mouth every 6 (six) hours as needed (for pain.).    [provider]  aspirin EC 81 MG tablet Take 1 tablet (81 mg total) by mouth daily. 01/30/21   Brunetta Genera, MD  dexamethasone (DECADRON) 4 MG tablet Take 10 tablets (61m) once on day 22. Repeat every 28 days  for the first 4 cycles. Take with breakfast. 01/30/21   Brunetta Genera, MD  lidocaine-prilocaine (EMLA) cream Apply to affected area once 01/30/21   Brunetta Genera, MD  ondansetron (ZOFRAN) 8 MG tablet Take 1 tablet (8 mg total) by mouth 2 (two) times daily as needed (Nausea or vomiting). 01/30/21   Brunetta Genera, MD  prochlorperazine (COMPAZINE) 10 MG tablet Take 1 tablet (10 mg total) by mouth every 6 (six) hours as needed  (Nausea or vomiting). 01/30/21   Brunetta Genera, MD     Vital Signs: Pulse 62   Temp 98.5 F (36.9 C) (Oral)   Resp 12   SpO2 100%   Physical Exam awake, alert.  Chest clear to auscultation bilaterally.  Heart with regular rate rhythm, occasional ectopy.  Abdomen soft, positive bowel sounds, nontender.  No lower extremity edema.  Imaging: No results found.  Labs:  CBC: Recent Labs    05/08/21 1155 05/15/21 1323 05/28/21 1356 06/01/21 0939  WBC 2.8* 2.7* 2.5* 2.2*  HGB 10.0* 10.4* 9.7* 10.4*  HCT 29.7* 30.3* 28.4* 31.0*  PLT 79* 83* 114* 135*    COAGS: Recent Labs    12/01/20 1609 12/19/20 1403 01/04/21 0746  INR 1.1 1.1 1.0  APTT  --  25  --     BMP: Recent Labs    05/01/21 0951 05/08/21 1155 05/15/21 1323 05/28/21 1356  NA 136 137 141 138  K 4.2 4.3 3.8 3.8  CL 105 104 107 106  CO2 '23 24 22 25  ' GLUCOSE 99 121* 106* 120*  BUN '9 12 11 13  ' CALCIUM 8.8* 8.9 9.1 8.3*  CREATININE 1.03 1.11 1.09 1.14  GFRNONAA >60 >60 >60 >60    LIVER FUNCTION TESTS: Recent Labs    05/01/21 0951 05/08/21 1155 05/15/21 1323 05/28/21 1356  BILITOT 1.3* 1.6* 1.6* 1.1  AST 15 15 13* 13*  ALT '17 16 16 14  ' ALKPHOS 52 55 54 46  PROT 5.8* 5.9* 6.1* 5.5*  ALBUMIN 3.6 3.6 3.6 3.4*    Assessment and Plan: Patient known to IR service from bone marrow biopsy on 01/04/2021.  He has a history of multiple myeloma and presents again today for follow-up CT-guided bone marrow biopsy to assess treatment response.Risks and benefits of procedure was discussed with the patient including, but not limited to bleeding, infection, damage to adjacent structures or low yield requiring additional tests.  All of the questions were answered and there is agreement to proceed.  Consent signed and in chart.    Electronically Signed: D. Rowe Robert, PA-C 06/01/2021, 10:30 AM   I spent a total of 20 minutes at the the patient's bedside AND on the patient's hospital floor or unit,  greater than 50% of which was counseling/coordinating care for CT-guided bone marrow biopsy

## 2021-06-01 NOTE — Procedures (Signed)
Interventional Radiology Procedure Note  Procedure: CT guided aspirate and core biopsy of right iliac bone Complications: None Recommendations: - Bedrest supine x 1 hrs - Hydrocodone PRN  Pain - Follow biopsy results  Signed,  Shizuye Rupert K. Percival Glasheen, MD   

## 2021-06-03 ENCOUNTER — Encounter: Payer: Self-pay | Admitting: Hematology

## 2021-06-04 NOTE — Progress Notes (Signed)
[facility] FOLLOW-UP progress notes  DOS .05/28/2021  Patient Care Team: Lavone Orn, MD as PCP - General (Internal Medicine)  CHIEF COMPLAINTS/PURPOSE OF VISIT:  Multiple myeloma, for further evaluation/management  HISTORY OF PRESENTING ILLNESS:   Brian Levine 75 y.o. male was seen in follow-up for continued evaluation and management of his multiple myeloma. He had has completed 4 cycles of carfilzomib + Revlimid.  His last clinic visit with [place] was on 05/01/2021 .  He notes no no overt carfilzomib or Revlimid related toxicities.   He notes minor intermittent cramps from Revlimid which she does not find limiting.  Endorses grade 1 fatigue and grade 1 dysgeusia. No infection issues.  No fevers no chills no night sweats no new bone pains. No abdominal pain or hematuria.  Labs today 05/28/2021 show CBC with hemoglobin of 9.7, platelets of 114 k WBC count of 2.5k with an ANC of 1.2k. CMP unremarkable serum kappa lambda free light chains from 05/08/2021 are within normal limits with normal ratio. Panel 05/08/2021 showed no M spike.  Patient had a PET CT scan 05/18/2021 which showed 1. Interval resolution of the hypermetabolism seen previously in the posterior left iliac bone. 2. Tiny hypermetabolic focus in the region of the left adrenal gland without nodule or mass lesion evident on CT imaging. Close follow-up recommended. 3. Otherwise no new or suspicious hypermetabolism on today's study. 4.  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 Prostatomegaly  He has bone marrow biopsy has not been scheduled until 06/01/2021.   Oncology History  Multiple myeloma ([place])  01/30/2021 Initial Diagnosis   Multiple myeloma ([place])   02/06/2021 -  Chemotherapy   Patient is on Treatment Plan : MYELOMA NEWLY DIAGNOSED TRANSPLANT CANDIDATE Carfilzomib (20/36)/ Lenalidomide / Dexamethasone (40/20) (KRd) q28d       MEDICAL HISTORY:  Past Medical History:  Diagnosis Date   BPH (benign  prostatic hyperplasia) 2004   Constipation    Other pancytopenia ([place]) 05/04/2013   Thrombocytopenia, unspecified ([place]) 08/17/2013   Weight loss 05/04/2013    SURGICAL HISTORY: Past Surgical History:  Procedure Laterality Date   APPENDECTOMY     COLONOSCOPY WITH PROPOFOL N/A 07/17/2015   Procedure: COLONOSCOPY WITH PROPOFOL;  Surgeon: Garlan Fair, MD;  [facility];  Service: Endoscopy;  Laterality: N/A;   CYSTOSCOPY WITH FULGERATION N/A 11/08/2020   Procedure: CYSTOSCOPY WITH FULGERATION OF BLEEDERS;  Surgeon: Irine Seal, MD;  Location: [facility];  Service: Urology;  Laterality: N/A;   CYSTOSCOPY WITH FULGERATION N/A 12/01/2020   Procedure: CYSTOSCOPY WITH FULGERATION OF  PROSTATE;  Surgeon: Irine Seal, MD;  Location[facility];  Service: Urology;  Laterality: N/A;   CYSTOSCOPY WITH INSERTION OF UROLIFT N/A 08/22/2015   Procedure: CYSTOSCOPY WITH INSERTION OF UROLIFT;  Surgeon: Irine Seal, MD;  [facility];  Service: Urology;  Laterality: N/A;   CYSTOSCOPY WITH LITHOLAPAXY N/A 10/24/2020   Procedure: CYSTOSCOPY WITH LITHOLAPAXY;  Surgeon: Irine Seal, MD;  [facility];  Service: Urology;  Laterality: N/A;  REQUESTING 1 HR FOR CASE   EYE SURGERY     right cataract surgery with lens implant   TESTICLE SURGERY Right 2011   hydrocele   TRANSURETHRAL RESECTION OF PROSTATE N/A 10/24/2020   Procedure: TRANSURETHRAL RESECTION OF THE PROSTATE (TURP);  Surgeon: Irine Seal, MD;  [facility];  Service: Urology;  Laterality: N/A;    SOCIAL HISTORY: Social History   Socioeconomic History   Marital status: Married    Spouse name: Not on file  Number of children: Not on file   Years of education: Not on file   Highest education level: Not on file  Occupational History   Not on file  Tobacco Use   Smoking status: Former    Packs/day: 1.50    Years: 30.00    Pack years: 45.00    Types: Cigarettes    Quit date: 07/30/1995    Years since quitting: 25.8   Smokeless  tobacco: Never  Vaping Use   Vaping Use: Never used  Substance and Sexual Activity   Alcohol use: No   Drug use: No   Sexual activity: Not on file  Other Topics Concern   Not on file  Social History Narrative   Not on file   Social Determinants of Health   Financial Resource Strain: Not on file  Food Insecurity: Not on file  Transportation Needs: Not on file  Physical Activity: Not on file  Stress: Not on file  Social Connections: Not on file  Intimate Partner Violence: Not on file    FAMILY HISTORY: No family history on file.  ALLERGIES:  has No Known Allergies.  MEDICATIONS:  Current Outpatient Medications  Medication Sig Dispense Refill   acetaminophen (TYLENOL) 325 MG tablet Take 650 mg by mouth every 6 (six) hours as needed (for pain.).     acyclovir (ZOVIRAX) 400 MG tablet TAKE 1 TABLET(400 MG) BY MOUTH DAILY 30 tablet 3   aspirin EC 81 MG tablet Take 1 tablet (81 mg total) by mouth daily. 100 tablet 3   calcium carbonate (TUMS - DOSED IN MG ELEMENTAL CALCIUM) 500 MG chewable tablet Chew 1 tablet by mouth daily.     cholecalciferol (VITAMIN D3) 25 MCG (1000 UNIT) tablet Take 5,000 Units by mouth daily.     dexamethasone (DECADRON) 4 MG tablet Take 10 tablets (79m) once on day 22. Repeat every 28 days for the first 4 cycles. Take with breakfast. 40 tablet 4   doxazosin (CARDURA) 8 MG tablet Take 8 mg by mouth daily in the afternoon.     finasteride (PROSCAR) 5 MG tablet Take 5 mg by mouth daily in the afternoon.     lenalidomide (REVLIMID) 15 MG capsule TAKE 1 CAPSULE BY MOUTH EVERY DAY FOR 21 DAYS THEN OFF FOR 7 DAYS EVERY 28 DAY CYCLE 21 capsule 0   lidocaine-prilocaine (EMLA) cream Apply to affected area once 30 g 3   ondansetron (ZOFRAN) 8 MG tablet Take 1 tablet (8 mg total) by mouth 2 (two) times daily as needed (Nausea or vomiting). 30 tablet 1   pravastatin (PRAVACHOL) 40 MG tablet Take 40 mg by mouth daily before supper.     prochlorperazine (COMPAZINE) 10 MG  tablet Take 1 tablet (10 mg total) by mouth every 6 (six) hours as needed (Nausea or vomiting). 30 tablet 1   No current facility-administered medications for this visit.    REVIEW OF SYSTEMS:   10 Point review of Systems was done is negative except as noted above.    PHYSICAL EXAMINATION: ECOG PERFORMANCE STATUS: 1 - Symptomatic but completely ambulatory  Vitals:   05/28/21 1507  BP: (!) 150/54  Pulse: (!) 57  Resp: 18  Temp: 98.1 F (36.7 C)  SpO2: 100%   Filed Weights   05/28/21 1507  Weight: 180 lb 1.6 oz (81.7 kg)   . GENERAL:alert, in no acute distress and comfortable SKIN: no acute rashes, no significant lesions EYES: conjunctiva are pink and non-injected, sclera anicteric OROPHARYNX: MMM, no exudates, no  oropharyngeal erythema or ulceration NECK: supple, no JVD LYMPH:  no palpable lymphadenopathy in the cervical, axillary or inguinal regions LUNGS: clear to auscultation b/l with normal respiratory effort HEART: regular rate & rhythm ABDOMEN:  normoactive bowel sounds , non tender, not distended. Extremity: no pedal edema PSYCH: alert & oriented x 3 with fluent speech NEURO: no focal motor/sensory deficits   LABORATORY DATA:   . CBC Latest Ref Rng & Units 06/01/2021 05/28/2021 05/15/2021  WBC 4.0 - 10.5 K/uL 2.2(L) 2.5(L) 2.7(L)  Hemoglobin 13.0 - 17.0 g/dL 10.4(L) 9.7(L) 10.4(L)  Hematocrit 39.0 - 52.0 % 31.0(L) 28.4(L) 30.3(L)  Platelets 150 - 400 K/uL 135(L) 114(L) 83(L)   . CMP Latest Ref Rng & Units 05/28/2021 05/15/2021 05/08/2021  Glucose 70 - 99 mg/dL 120(H) 106(H) 121(H)  BUN 8 - 23 mg/dL '13 11 12  ' Creatinine 0.61 - 1.24 mg/dL 1.14 1.09 1.11  Sodium 135 - 145 mmol/L 138 141 137  Potassium 3.5 - 5.1 mmol/L 3.8 3.8 4.3  Chloride 98 - 111 mmol/L 106 107 104  CO2 22 - 32 mmol/L '25 22 24  ' Calcium 8.9 - 10.3 mg/dL 8.3(L) 9.1 8.9  Total Protein 6.5 - 8.1 g/dL 5.5(L) 6.1(L) 5.9(L)  Total Bilirubin 0.3 - 1.2 mg/dL 1.1 1.6(H) 1.6(H)  Alkaline Phos 38  - 126 U/L 46 54 55  AST 15 - 41 U/L 13(L) 13(L) 15  ALT 0 - 44 U/L '14 16 16      ' ASSESSMENT & PLAN:   75 yo with    1) Recurrent/persistent hematuria bleeding after prostate surgery. - resolved No fhx or previous personal h/o bleeding issues.  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 CMP unremarkable serum kappa lambda free light chains from 05/08/2021 are within normal limits with normal ratio. Panel 05/08/2021 showed no M spike.  Patient had a PET CT scan 05/18/2021 which showed 1. Interval resolution of the hypermetabolism seen previously in the posterior left iliac bone. 2. Tiny hypermetabolic focus in the region of the left adrenal gland without nodule or mass lesion evident on CT imaging. Close follow-up recommended. 3. Otherwise no new or suspicious hypermetabolism on today's study. 4.  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 Prostatomegaly  He has bone marrow biopsy has not been scheduled until 06/01/2021.  -Patient was referred to Dr. Aris Lot for consideration of HDT-AutoHSCT.  He received a call to set up this appointment but wanted to hold off on being seen today we had another discussion.  We discussed in details the role for consolidative high-dose melphalan with autologous stem cell transplant.  He does have some high risk mutations we suggested he consider this.  Patient notes he is feeling well and does not feel like he wants to go with a very aggressive route of treatment at this time and wants to hold off on these considerations.  -After long discussion patient has decided to proceed with maintenance Revlimid treatment.  We shall continue 15 mg 3 weeks on 1  week off of Revlimid at this time. -Further treatment adjustments if necessary based on bone marrow biopsy results -Continue on 2000 IU Vitamin D daily. -continue Zometa every 4 weeks  FOLLOW UP:  RTC  with Dr Irene Limbo with labs in 4 weeks Continue Zometa every 4 weeks   The total time spent in the appointment was 30 minutes and more than 50% was on counseling and direct patient cares.   Brian Lone MD MS Hematology/Oncology Physician [facility]

## 2021-06-05 ENCOUNTER — Inpatient Hospital Stay: Payer: Medicare HMO

## 2021-06-05 ENCOUNTER — Other Ambulatory Visit: Payer: Self-pay

## 2021-06-05 ENCOUNTER — Inpatient Hospital Stay: Payer: Medicare HMO | Attending: Hematology

## 2021-06-05 ENCOUNTER — Other Ambulatory Visit: Payer: Self-pay | Admitting: Hematology

## 2021-06-05 VITALS — BP 129/63 | HR 67 | Temp 98.3°F | Resp 18

## 2021-06-05 DIAGNOSIS — C9000 Multiple myeloma not having achieved remission: Secondary | ICD-10-CM | POA: Insufficient documentation

## 2021-06-05 LAB — CBC WITH DIFFERENTIAL/PLATELET
Abs Immature Granulocytes: 0.01 10*3/uL (ref 0.00–0.07)
Basophils Absolute: 0 10*3/uL (ref 0.0–0.1)
Basophils Relative: 1 %
Eosinophils Absolute: 0.3 10*3/uL (ref 0.0–0.5)
Eosinophils Relative: 9 %
HCT: 31.1 % — ABNORMAL LOW (ref 39.0–52.0)
Hemoglobin: 10.5 g/dL — ABNORMAL LOW (ref 13.0–17.0)
Immature Granulocytes: 0 %
Lymphocytes Relative: 30 %
Lymphs Abs: 0.9 10*3/uL (ref 0.7–4.0)
MCH: 31.3 pg (ref 26.0–34.0)
MCHC: 33.8 g/dL (ref 30.0–36.0)
MCV: 92.8 fL (ref 80.0–100.0)
Monocytes Absolute: 0.2 10*3/uL (ref 0.1–1.0)
Monocytes Relative: 5 %
Neutro Abs: 1.7 10*3/uL (ref 1.7–7.7)
Neutrophils Relative %: 55 %
Platelets: 86 10*3/uL — ABNORMAL LOW (ref 150–400)
RBC: 3.35 MIL/uL — ABNORMAL LOW (ref 4.22–5.81)
RDW: 13.5 % (ref 11.5–15.5)
WBC: 3.1 10*3/uL — ABNORMAL LOW (ref 4.0–10.5)
nRBC: 0 % (ref 0.0–0.2)

## 2021-06-05 LAB — CMP (CANCER CENTER ONLY)
ALT: 12 U/L (ref 0–44)
AST: 12 U/L — ABNORMAL LOW (ref 15–41)
Albumin: 3.5 g/dL (ref 3.5–5.0)
Alkaline Phosphatase: 57 U/L (ref 38–126)
Anion gap: 7 (ref 5–15)
BUN: 9 mg/dL (ref 8–23)
CO2: 24 mmol/L (ref 22–32)
Calcium: 8.3 mg/dL — ABNORMAL LOW (ref 8.9–10.3)
Chloride: 106 mmol/L (ref 98–111)
Creatinine: 1.13 mg/dL (ref 0.61–1.24)
GFR, Estimated: >60 mL/min (ref >60)
Glucose, Bld: 110 mg/dL — ABNORMAL HIGH (ref 70–99)
Potassium: 3.7 mmol/L (ref 3.5–5.1)
Sodium: 137 mmol/L (ref 135–145)
Total Bilirubin: 1.3 mg/dL — ABNORMAL HIGH (ref 0.3–1.2)
Total Protein: 5.7 g/dL — ABNORMAL LOW (ref 6.5–8.1)

## 2021-06-05 LAB — SURGICAL PATHOLOGY

## 2021-06-05 MED ORDER — SODIUM CHLORIDE 0.9% FLUSH: 10.0000 mL | Freq: Once | INTRAVENOUS | Status: DC | PRN

## 2021-06-05 MED ORDER — ZOLEDRONIC ACID 4 MG/100ML IV SOLN
4.0000 mg | Freq: Once | INTRAVENOUS | Status: AC
  Administered 2021-06-05: 4 mg via INTRAVENOUS
  Filled 2021-06-05: qty 100

## 2021-06-05 MED ORDER — HEPARIN SOD (PORK) LOCK FLUSH 100 UNIT/ML IV SOLN: 500.0000 [IU] | Freq: Once | INTRAVENOUS | Status: DC | PRN

## 2021-06-05 MED ORDER — HEPARIN SOD (PORK) LOCK FLUSH 100 UNIT/ML IV SOLN: 250.0000 [IU] | Freq: Once | INTRAVENOUS | Status: DC | PRN

## 2021-06-05 MED ORDER — ALTEPLASE 2 MG IJ SOLR: 2.0000 mg | Freq: Once | INTRAMUSCULAR | Status: DC | PRN

## 2021-06-05 MED ORDER — SODIUM CHLORIDE 0.9 % IV SOLN: Freq: Once | INTRAVENOUS | Status: AC

## 2021-06-05 MED ORDER — SODIUM CHLORIDE 0.9% FLUSH: 3.0000 mL | Freq: Once | INTRAVENOUS | Status: DC | PRN

## 2021-06-05 NOTE — Patient Instructions (Signed)

## 2021-06-05 NOTE — Progress Notes (Signed)
Ca 8.3. Per Dr Irene Limbo ok for Zometa today.

## 2021-06-05 NOTE — Progress Notes (Signed)
MD aware of CA level.  OK to proceed with Zometa.

## 2021-06-07 ENCOUNTER — Other Ambulatory Visit: Payer: Self-pay | Admitting: Hematology

## 2021-06-07 DIAGNOSIS — Z7189 Other specified counseling: Secondary | ICD-10-CM

## 2021-06-07 DIAGNOSIS — C9000 Multiple myeloma not having achieved remission: Secondary | ICD-10-CM

## 2021-06-11 ENCOUNTER — Encounter (HOSPITAL_COMMUNITY): Payer: Self-pay | Admitting: Hematology

## 2021-06-11 ENCOUNTER — Other Ambulatory Visit: Payer: Self-pay

## 2021-06-11 ENCOUNTER — Encounter: Payer: Self-pay | Admitting: Hematology

## 2021-06-25 ENCOUNTER — Inpatient Hospital Stay (HOSPITAL_BASED_OUTPATIENT_CLINIC_OR_DEPARTMENT_OTHER): Payer: Medicare HMO | Admitting: Hematology

## 2021-06-25 ENCOUNTER — Other Ambulatory Visit: Payer: Self-pay

## 2021-06-25 ENCOUNTER — Inpatient Hospital Stay: Payer: Medicare HMO

## 2021-06-25 VITALS — BP 127/63 | HR 56 | Temp 97.6°F | Resp 20 | Wt 182.9 lb

## 2021-06-25 DIAGNOSIS — C9000 Multiple myeloma not having achieved remission: Secondary | ICD-10-CM | POA: Diagnosis not present

## 2021-06-25 LAB — CBC WITH DIFFERENTIAL/PLATELET
Abs Immature Granulocytes: 0 10*3/uL (ref 0.00–0.07)
Basophils Absolute: 0 10*3/uL (ref 0.0–0.1)
Basophils Relative: 2 %
Eosinophils Absolute: 0.1 10*3/uL (ref 0.0–0.5)
Eosinophils Relative: 3 %
HCT: 32.8 % — ABNORMAL LOW (ref 39.0–52.0)
Hemoglobin: 10.9 g/dL — ABNORMAL LOW (ref 13.0–17.0)
Immature Granulocytes: 0 %
Lymphocytes Relative: 42 %
Lymphs Abs: 1 10*3/uL (ref 0.7–4.0)
MCH: 30.1 pg (ref 26.0–34.0)
MCHC: 33.2 g/dL (ref 30.0–36.0)
MCV: 90.6 fL (ref 80.0–100.0)
Monocytes Absolute: 0.3 10*3/uL (ref 0.1–1.0)
Monocytes Relative: 11 %
Neutro Abs: 1 10*3/uL — ABNORMAL LOW (ref 1.7–7.7)
Neutrophils Relative %: 42 %
Platelets: 94 10*3/uL — ABNORMAL LOW (ref 150–400)
RBC: 3.62 MIL/uL — ABNORMAL LOW (ref 4.22–5.81)
RDW: 13 % (ref 11.5–15.5)
WBC: 2.4 10*3/uL — ABNORMAL LOW (ref 4.0–10.5)
nRBC: 0 % (ref 0.0–0.2)

## 2021-06-25 LAB — CMP (CANCER CENTER ONLY)
ALT: 12 U/L (ref 0–44)
AST: 15 U/L (ref 15–41)
Albumin: 3.5 g/dL (ref 3.5–5.0)
Alkaline Phosphatase: 57 U/L (ref 38–126)
Anion gap: 8 (ref 5–15)
BUN: 9 mg/dL (ref 8–23)
CO2: 25 mmol/L (ref 22–32)
Calcium: 8.7 mg/dL — ABNORMAL LOW (ref 8.9–10.3)
Chloride: 108 mmol/L (ref 98–111)
Creatinine: 1.18 mg/dL (ref 0.61–1.24)
GFR, Estimated: >60 mL/min (ref >60)
Glucose, Bld: 112 mg/dL — ABNORMAL HIGH (ref 70–99)
Potassium: 4 mmol/L (ref 3.5–5.1)
Sodium: 141 mmol/L (ref 135–145)
Total Bilirubin: 0.9 mg/dL (ref 0.3–1.2)
Total Protein: 6.1 g/dL — ABNORMAL LOW (ref 6.5–8.1)

## 2021-06-25 NOTE — Progress Notes (Signed)
[place] FOLLOW-UP progress notes  DOS .06/25/2021  Patient Care Team: Lavone Orn, MD as PCP - General (Internal Medicine)  CHIEF COMPLAINTS/PURPOSE OF VISIT:  Multiple myeloma, for further evaluation/management  HISTORY OF PRESENTING ILLNESS:   Brian Levine 75 y.o. male was seen in follow-up for continued evaluation and management of his multiple myeloma. He had has completed 4 cycles of carfilzomib + Revlimid.  His last clinic visit with [place] was on 05/28/2021 .  Patient's last PET CT scan from 05/18/2021 showed interval resolution of hypermetabolism in the left posterior left iliac bone.  No new or suspicious hypermetabolism suggesting new or active myeloma.  He reports he is doing well. He mentions he is tolerating 15 mg revlimid well. He adds that he has not had a lot of energy but this is baseline for him. He is in remission at this time.  Bone marrow biopsy from 06/01/2021 showed 1% plasma cells with polyclonal staining pattern with no diagnostic evidence of residual plasma cell neoplasm.  Myeloma FISH panel shows no specific mutations.  We again discussed and confirmed that the patient chooses not to proceed with evaluation for high-dose chemotherapy autologous bone marrow transplantation at this time and would prefer to go with continued maintenance Revlimid.  No other acute new symptoms today.         Oncology History  Multiple myeloma ([place])  01/30/2021 Initial Diagnosis   Multiple myeloma ([place])   02/06/2021 -  Chemotherapy   Patient is on Treatment Plan : MYELOMA NEWLY DIAGNOSED TRANSPLANT CANDIDATE Carfilzomib (20/36)/ Lenalidomide / Dexamethasone (40/20) (KRd) q28d       MEDICAL HISTORY:  Past Medical History:  Diagnosis Date   BPH (benign prostatic hyperplasia) 2004   Constipation    Other pancytopenia ([place]) 05/04/2013   Thrombocytopenia, unspecified ([place]) 08/17/2013   Weight loss 05/04/2013    SURGICAL HISTORY: Past Surgical History:   Procedure Laterality Date   APPENDECTOMY     COLONOSCOPY WITH PROPOFOL N/A 07/17/2015   Procedure: COLONOSCOPY WITH PROPOFOL;  Surgeon: Garlan Fair, MD;  [facility];  Service: Endoscopy;  Laterality: N/A;   CYSTOSCOPY WITH FULGERATION N/A 11/08/2020   Procedure: CYSTOSCOPY WITH FULGERATION OF BLEEDERS;  Surgeon: Irine Seal, MD;  [facility];  Service: Urology;  Laterality: N/A;   CYSTOSCOPY WITH FULGERATION N/A 12/01/2020   Procedure: CYSTOSCOPY WITH FULGERATION OF  PROSTATE;  Surgeon: Irine Seal, MD;  [facility];  Service: Urology;  Laterality: N/A;   CYSTOSCOPY WITH INSERTION OF UROLIFT N/A 08/22/2015   Procedure: CYSTOSCOPY WITH INSERTION OF UROLIFT;  Surgeon: Irine Seal, MD;  [facility];  Service: Urology;  Laterality: N/A;   CYSTOSCOPY WITH LITHOLAPAXY N/A 10/24/2020   Procedure: CYSTOSCOPY WITH LITHOLAPAXY;  Surgeon: Irine Seal, MD;  [facility];  Service: Urology;  Laterality: N/A;  REQUESTING 1 HR FOR CASE   EYE SURGERY     right cataract surgery with lens implant   TESTICLE SURGERY Right 2011   hydrocele   TRANSURETHRAL RESECTION OF PROSTATE N/A 10/24/2020   Procedure: TRANSURETHRAL RESECTION OF THE PROSTATE (TURP);  Surgeon: Irine Seal, MD;  [facility];  Service: Urology;  Laterality: N/A;    SOCIAL HISTORY: Social History   Socioeconomic History   Marital status: Married    Spouse name: Not on file   Number of children: Not on file   Years of education: Not on file   Highest education level: Not on file  Occupational History   Not  on file  Tobacco Use   Smoking status: Former    Packs/day: 1.50    Years: 30.00    Pack years: 45.00    Types: Cigarettes    Quit date: 07/30/1995    Years since quitting: 25.9   Smokeless tobacco: Never  Vaping Use   Vaping Use: Never used  Substance and Sexual Activity   Alcohol use: No   Drug use: No   Sexual activity: Not on file  Other Topics Concern   Not on file  Social History  Narrative   Not on file   Social Determinants of Health   Financial Resource Strain: Not on file  Food Insecurity: Not on file  Transportation Needs: Not on file  Physical Activity: Not on file  Stress: Not on file  Social Connections: Not on file  Intimate Partner Violence: Not on file    FAMILY HISTORY: No family history on file.  ALLERGIES:  has No Known Allergies.  MEDICATIONS:  Current Outpatient Medications  Medication Sig Dispense Refill   acetaminophen (TYLENOL) 325 MG tablet Take 650 mg by mouth every 6 (six) hours as needed (for pain.).     acyclovir (ZOVIRAX) 400 MG tablet TAKE 1 TABLET(400 MG) BY MOUTH DAILY 30 tablet 3   aspirin EC 81 MG tablet Take 1 tablet (81 mg total) by mouth daily. 100 tablet 3   calcium carbonate (TUMS - DOSED IN MG ELEMENTAL CALCIUM) 500 MG chewable tablet Chew 1 tablet by mouth daily.     cholecalciferol (VITAMIN D3) 25 MCG (1000 UNIT) tablet Take 5,000 Units by mouth daily.     dexamethasone (DECADRON) 4 MG tablet Take 10 tablets (37m) once on day 22. Repeat every 28 days for the first 4 cycles. Take with breakfast. 40 tablet 4   doxazosin (CARDURA) 8 MG tablet Take 8 mg by mouth daily in the afternoon.     finasteride (PROSCAR) 5 MG tablet Take 5 mg by mouth daily in the afternoon.     lenalidomide (REVLIMID) 15 MG capsule TAKE 1 CAPSULE BY MOUTH EVERY DAY FOR 21 DAYS THEN OFF FOR 7 DAYS EVERY 28 DAY CYCLE 21 capsule 0   lidocaine-prilocaine (EMLA) cream Apply to affected area once 30 g 3   ondansetron (ZOFRAN) 8 MG tablet Take 1 tablet (8 mg total) by mouth 2 (two) times daily as needed (Nausea or vomiting). 30 tablet 1   pravastatin (PRAVACHOL) 40 MG tablet Take 40 mg by mouth daily before supper.     prochlorperazine (COMPAZINE) 10 MG tablet Take 1 tablet (10 mg total) by mouth every 6 (six) hours as needed (Nausea or vomiting). 30 tablet 1   No current facility-administered medications for this visit.    REVIEW OF SYSTEMS:   10  Point review of Systems was done is negative except as noted above.    PHYSICAL EXAMINATION: ECOG PERFORMANCE STATUS: 1 - Symptomatic but completely ambulatory  Vitals:   06/25/21 1334  BP: 127/63  Pulse: (!) 56  Resp: 20  Temp: 97.6 F (36.4 C)  SpO2: 100%    Filed Weights   06/25/21 1334  Weight: 182 lb 14.4 oz (83 kg)    . GENERAL:alert, in no acute distress and comfortable SKIN: no acute rashes, no significant lesions EYES: conjunctiva are pink and non-injected, sclera anicteric OROPHARYNX: MMM, no exudates, no oropharyngeal erythema or ulceration NECK: supple, no JVD LYMPH:  no palpable lymphadenopathy in the cervical, axillary or inguinal regions LUNGS: clear to auscultation b/l with normal  respiratory effort HEART: regular rate & rhythm ABDOMEN:  normoactive bowel sounds , non tender, not distended. Extremity: no pedal edema PSYCH: alert & oriented x 3 with fluent speech NEURO: no focal motor/sensory deficits   LABORATORY DATA:  . CBC Latest Ref Rng & Units 06/25/2021 06/05/2021 06/01/2021  WBC 4.0 - 10.5 K/uL 2.4(L) 3.1(L) 2.2(L)  Hemoglobin 13.0 - 17.0 g/dL 10.9(L) 10.5(L) 10.4(L)  Hematocrit 39.0 - 52.0 % 32.8(L) 31.1(L) 31.0(L)  Platelets 150 - 400 K/uL 94(L) 86(L) 135(L)    CMP Latest Ref Rng & Units 06/25/2021 06/05/2021 05/28/2021  Glucose 70 - 99 mg/dL 112(H) 110(H) 120(H)  BUN 8 - 23 mg/dL '9 9 13  ' Creatinine 0.61 - 1.24 mg/dL 1.18 1.13 1.14  Sodium 135 - 145 mmol/L 141 137 138  Potassium 3.5 - 5.1 mmol/L 4.0 3.7 3.8  Chloride 98 - 111 mmol/L 108 106 106  CO2 22 - 32 mmol/L '25 24 25  ' Calcium 8.9 - 10.3 mg/dL 8.7(L) 8.3(L) 8.3(L)  Total Protein 6.5 - 8.1 g/dL 6.1(L) 5.7(L) 5.5(L)  Total Bilirubin 0.3 - 1.2 mg/dL 0.9 1.3(H) 1.1  Alkaline Phos 38 - 126 U/L 57 57 46  AST 15 - 41 U/L 15 12(L) 13(L)  ALT 0 - 44 U/L '12 12 14     ' 05/18/2021: PET SCAN Interval resolution of the hypermetabolism seen previously in the posterior left iliac bone. 2.  Tiny hypermetabolic focus in the region of the left adrenal gland without nodule or mass lesion evident on CT imaging. Close follow-up recommended. 3. Otherwise no new or suspicious hypermetabolism on today's study. 4.  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 Prostatomegaly  06/01/2021: Bone Marrow Biopsy: Surgical Pathology  CASE: [id number]  PATIENT: Brian Levine  Bone Marrow Report      Clinical History: History of multiple myeloma , (BH)   DIAGNOSIS:   BONE MARROW, ASPIRATE, CLOT, CORE:  -Hypercellular bone marrow for age with trilineage hematopoiesis and 1%  plasma cells  -See comment   PERIPHERAL BLOOD:  -Pancytopenia   COMMENT:   The bone marrow is hypercellular with trilineage hematopoiesis and nonspecific myeloid changes likely related to therapy.  In this  background, the plasma cells represent 1% of all cells in the aspirate associated with scattering of interstitial cells in the clot and biopsy sections with lack of large aggregates or sheets. The plasma cells display polyclonal staining pattern for kappa and lambda light chains and hence, there is no definitive or diagnostic evidence of residual plasma cell neoplasm.  Correlation with cytogenetic and FISH studies is recommended.   CLOT AND BIOPSY: The sections show cellularity ranging from 20 to 70%  with a mixture of myeloid cell types.  Large clusters or sheets of plasma cells are not identified.  Significant lymphoid aggregates are  not seen.  Immunohistochemical stain for CD138 and in situ hybridization  for kappa and lambda were performed on blocks B1 and C1 with appropriate  controls.  CD138 highlights an extremely minor plasma cell component mostly consisting of scattering of interstitial cells and displays  polyclonal staining pattern for kappa and lambda light chains.  Bone Marrow count performed on 500 cells shows:  Blasts:   0%   Myeloid:  39%  Promyelocytes: 0%   Erythroid:     52%  Myelocytes:     6%   Lymphocytes:   6%  Metamyelocytes:     3%   Plasma cells:  1%  Bands:    11%  Neutrophils:   14%  M:E ratio:  0.75  Eosinophils:   5%  Basophils:     0%  Monocytes:     2%   Lab Data: CBC performed on 06/01/21 shows:  WBC: 2.2 k/uL  Neutrophils:   56%  Hgb: 10.4 g/dL Lymphocytes:   26%  HCT: 31.0 %    Monocytes:     12%  MCV: 94.8 fL   Eosinophils:   4%  RDW: 14.2 %    Basophils:     2%  PLT: 135 k/uL     ASSESSMENT & PLAN:   75 yo with    1) Recurrent/persistent hematuria bleeding after prostate surgery. - resolved No fhx or previous personal h/o bleeding issues.  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 CMP unremarkable serum kappa lambda free light chains from 05/08/2021 are within normal limits with normal ratio. Panel 05/08/2021 showed no M spike.  Patient had a PET CT scan 05/18/2021 which showed no evidence of new or active multiple myeloma.  Bone marrow biopsy done on 06/01/2021 shows no evidence of residual plasma cell neoplasm.  Patient had 1% plasma cells which are polyclonal on immunohistochemistry.  Myeloma FISH panel does not show any of his previous mutations.  -After long discussion patient has decided to proceed with maintenance Revlimid treatment.  We shall continue 15 mg 3 weeks on 1 week off of Revlimid at this time. -Might need to reduce to Revlimid 10 mg if patient continues to develop significant cytopenias. -Continue on 2000 IU Vitamin D daily.  FOLLOW UP:  note: -Next dose of Zometa as scheduled on 07/03/2021. After this please schedule Zometa every 8 weeks x 6 doses with labs -MD visit with next dose of Zometa in about 9-10 weeks with labs    . The total time spent in the appointment was 30 minutes and more than 50% was on counseling and direct patient cares.    I,Zite Okoli,acting as a Education administrator for Sullivan Lone, MD.,have documented all relevant documentation on the behalf of Sullivan Lone, MD,as directed by  Sullivan Lone, MD while in the presence of Sullivan Lone, MD.   .I have reviewed the above documentation for accuracy and completeness, and I agree with the above.  Sullivan Lone MD MS Hematology/Oncology Physician [facility]

## 2021-06-26 ENCOUNTER — Telehealth: Payer: Self-pay | Admitting: Hematology

## 2021-06-26 NOTE — Telephone Encounter (Signed)
Scheduled follow-up appointments per 11/28 los. Patient is aware.

## 2021-06-30 ENCOUNTER — Other Ambulatory Visit: Payer: Self-pay | Admitting: Hematology

## 2021-06-30 DIAGNOSIS — Z7189 Other specified counseling: Secondary | ICD-10-CM

## 2021-06-30 DIAGNOSIS — C9000 Multiple myeloma not having achieved remission: Secondary | ICD-10-CM

## 2021-06-30 NOTE — Progress Notes (Signed)
This makes no sense.  The diagnosis is clearly listed as multiple myeloma not in remission.  Further, this test was ordered by the patient's oncologist.  I would encourage you to reach out to them if further info is required for coverage.

## 2021-07-01 ENCOUNTER — Encounter: Payer: Self-pay | Admitting: Hematology

## 2021-07-03 ENCOUNTER — Other Ambulatory Visit: Payer: Self-pay

## 2021-07-03 ENCOUNTER — Inpatient Hospital Stay: Payer: Medicare HMO | Attending: Hematology

## 2021-07-03 VITALS — BP 116/62 | HR 57 | Temp 98.2°F | Resp 18

## 2021-07-03 DIAGNOSIS — C9000 Multiple myeloma not having achieved remission: Secondary | ICD-10-CM | POA: Diagnosis not present

## 2021-07-03 MED ORDER — ZOLEDRONIC ACID 4 MG/100ML IV SOLN
4.0000 mg | Freq: Once | INTRAVENOUS | Status: AC
  Administered 2021-07-03: 4 mg via INTRAVENOUS
  Filled 2021-07-03: qty 100

## 2021-07-03 MED ORDER — SODIUM CHLORIDE 0.9 % IV SOLN: Freq: Once | INTRAVENOUS | Status: AC

## 2021-07-03 NOTE — Patient Instructions (Signed)

## 2021-07-04 ENCOUNTER — Other Ambulatory Visit: Payer: Self-pay

## 2021-07-04 ENCOUNTER — Encounter: Payer: Self-pay | Admitting: Hematology

## 2021-07-16 ENCOUNTER — Other Ambulatory Visit: Payer: Self-pay

## 2021-07-16 DIAGNOSIS — Z7189 Other specified counseling: Secondary | ICD-10-CM

## 2021-07-16 DIAGNOSIS — C9000 Multiple myeloma not having achieved remission: Secondary | ICD-10-CM

## 2021-07-16 MED ORDER — ACYCLOVIR 400 MG PO TABS: ORAL_TABLET | ORAL | 3 refills | Status: DC

## 2021-08-02 ENCOUNTER — Other Ambulatory Visit: Payer: Self-pay | Admitting: Hematology

## 2021-08-02 DIAGNOSIS — Z7189 Other specified counseling: Secondary | ICD-10-CM

## 2021-08-02 DIAGNOSIS — C9000 Multiple myeloma not having achieved remission: Secondary | ICD-10-CM

## 2021-08-06 ENCOUNTER — Encounter: Payer: Self-pay | Admitting: Hematology

## 2021-08-06 ENCOUNTER — Other Ambulatory Visit: Payer: Self-pay

## 2021-08-15 ENCOUNTER — Encounter: Payer: Self-pay | Admitting: Hematology

## 2021-08-15 NOTE — Progress Notes (Signed)
Pt called regarding copay assistance for 2023 so I completed the online application w/ the [place] and he was approved for $12,000 from 07/16/21 through 07/15/22 for Zometa and Revlimid.

## 2021-08-27 ENCOUNTER — Other Ambulatory Visit: Payer: Self-pay

## 2021-08-27 DIAGNOSIS — C9000 Multiple myeloma not having achieved remission: Secondary | ICD-10-CM

## 2021-08-28 ENCOUNTER — Inpatient Hospital Stay: Payer: Medicare HMO

## 2021-08-28 ENCOUNTER — Other Ambulatory Visit: Payer: Self-pay

## 2021-08-28 ENCOUNTER — Inpatient Hospital Stay: Payer: Medicare HMO | Attending: Hematology | Admitting: Hematology

## 2021-08-28 VITALS — BP 128/62 | HR 61 | Temp 97.8°F | Resp 18 | Wt 186.4 lb

## 2021-08-28 DIAGNOSIS — C9000 Multiple myeloma not having achieved remission: Secondary | ICD-10-CM

## 2021-08-28 LAB — CBC WITH DIFFERENTIAL/PLATELET
Abs Immature Granulocytes: 0 10*3/uL (ref 0.00–0.07)
Basophils Absolute: 0 10*3/uL (ref 0.0–0.1)
Basophils Relative: 1 %
Eosinophils Absolute: 0.2 10*3/uL (ref 0.0–0.5)
Eosinophils Relative: 7 %
HCT: 34 % — ABNORMAL LOW (ref 39.0–52.0)
Hemoglobin: 12 g/dL — ABNORMAL LOW (ref 13.0–17.0)
Immature Granulocytes: 0 %
Lymphocytes Relative: 47 %
Lymphs Abs: 1 10*3/uL (ref 0.7–4.0)
MCH: 29.3 pg (ref 26.0–34.0)
MCHC: 35.3 g/dL (ref 30.0–36.0)
MCV: 83.1 fL (ref 80.0–100.0)
Monocytes Absolute: 0.1 10*3/uL (ref 0.1–1.0)
Monocytes Relative: 4 %
Neutro Abs: 0.9 10*3/uL — ABNORMAL LOW (ref 1.7–7.7)
Neutrophils Relative %: 41 %
Platelets: 87 10*3/uL — ABNORMAL LOW (ref 150–400)
RBC: 4.09 MIL/uL — ABNORMAL LOW (ref 4.22–5.81)
RDW: 14.4 % (ref 11.5–15.5)
WBC: 2.2 10*3/uL — ABNORMAL LOW (ref 4.0–10.5)
nRBC: 0 % (ref 0.0–0.2)

## 2021-08-28 LAB — CMP (CANCER CENTER ONLY)
ALT: 16 U/L (ref 0–44)
AST: 18 U/L (ref 15–41)
Albumin: 4.2 g/dL (ref 3.5–5.0)
Alkaline Phosphatase: 50 U/L (ref 38–126)
Anion gap: 8 (ref 5–15)
BUN: 10 mg/dL (ref 8–23)
CO2: 26 mmol/L (ref 22–32)
Calcium: 9.1 mg/dL (ref 8.9–10.3)
Chloride: 104 mmol/L (ref 98–111)
Creatinine: 1.29 mg/dL — ABNORMAL HIGH (ref 0.61–1.24)
GFR, Estimated: 58 mL/min — ABNORMAL LOW (ref >60)
Glucose, Bld: 111 mg/dL — ABNORMAL HIGH (ref 70–99)
Potassium: 3.7 mmol/L (ref 3.5–5.1)
Sodium: 138 mmol/L (ref 135–145)
Total Bilirubin: 1.1 mg/dL (ref 0.3–1.2)
Total Protein: 6.8 g/dL (ref 6.5–8.1)

## 2021-08-28 MED ORDER — ZOLEDRONIC ACID 4 MG/100ML IV SOLN
4.0000 mg | Freq: Once | INTRAVENOUS | Status: AC
  Administered 2021-08-28: 4 mg via INTRAVENOUS
  Filled 2021-08-28: qty 100

## 2021-08-28 MED ORDER — SODIUM CHLORIDE 0.9 % IV SOLN: Freq: Once | INTRAVENOUS | Status: AC

## 2021-08-28 NOTE — Patient Instructions (Signed)

## 2021-08-29 ENCOUNTER — Telehealth: Payer: Self-pay | Admitting: Hematology

## 2021-08-29 LAB — KAPPA/LAMBDA LIGHT CHAINS
Kappa free light chain: 39.2 mg/L — ABNORMAL HIGH (ref 3.3–19.4)
Kappa, lambda light chain ratio: 1.37 (ref 0.26–1.65)
Lambda free light chains: 28.7 mg/L — ABNORMAL HIGH (ref 5.7–26.3)

## 2021-08-29 NOTE — Telephone Encounter (Signed)
Scheduled follow-up appointments per 1/31 los. Patient is aware.

## 2021-08-30 LAB — MULTIPLE MYELOMA PANEL, SERUM
Albumin SerPl Elph-Mcnc: 3.8 g/dL (ref 2.9–4.4)
Albumin/Glob SerPl: 1.5 (ref 0.7–1.7)
Alpha 1: 0.2 g/dL (ref 0.0–0.4)
Alpha2 Glob SerPl Elph-Mcnc: 0.7 g/dL (ref 0.4–1.0)
B-Globulin SerPl Elph-Mcnc: 0.8 g/dL (ref 0.7–1.3)
Gamma Glob SerPl Elph-Mcnc: 0.9 g/dL (ref 0.4–1.8)
Globulin, Total: 2.7 g/dL (ref 2.2–3.9)
IgA: 218 mg/dL (ref 61–437)
IgG (Immunoglobin G), Serum: 906 mg/dL (ref 603–1613)
IgM (Immunoglobulin M), Srm: 28 mg/dL (ref 15–143)
Total Protein ELP: 6.5 g/dL (ref 6.0–8.5)

## 2021-09-03 ENCOUNTER — Other Ambulatory Visit: Payer: Self-pay | Admitting: Hematology

## 2021-09-03 ENCOUNTER — Encounter: Payer: Self-pay | Admitting: Hematology

## 2021-09-03 DIAGNOSIS — C9000 Multiple myeloma not having achieved remission: Secondary | ICD-10-CM

## 2021-09-03 DIAGNOSIS — Z7189 Other specified counseling: Secondary | ICD-10-CM

## 2021-09-03 NOTE — Progress Notes (Signed)
[facility] FOLLOW-UP progress notes  DOS .Marland Kitchen01/31/2023   Patient Care Team: Lavone Orn, MD as PCP - General (Internal Medicine)  CHIEF COMPLAINTS/PURPOSE OF VISIT:  Follow-up for continued evaluation and management of multiple myeloma  HISTORY OF PRESENTING ILLNESS:  Please see previous notes for details on initial presentation.  Brian Levine 76 y.o. male was seen in follow-up for continued evaluation and management of his multiple myeloma. He had has completed 4 cycles of carfilzomib + Revlimid.   Patient's last PET CT scan from 05/18/2021 showed interval resolution of hypermetabolism in the left posterior left iliac bone.  No new or suspicious hypermetabolism suggesting new or active myeloma.  He reports he is doing well. He mentions he is tolerating 15 mg revlimid well. He adds that he has not had a lot of energy but this is baseline for him. He is in remission at this time.  Bone marrow biopsy from 06/01/2021 showed 1% plasma cells with polyclonal staining pattern with no diagnostic evidence of residual plasma cell neoplasm.  Myeloma FISH panel shows no specific mutations.  We again discussed and confirmed that the patient chooses not to proceed with evaluation for high-dose chemotherapy autologous bone marrow transplantation at this time and would prefer to go with continued maintenance Revlimid.   INTERVAL HISTORY  [name] is here for continued evaluation and management of his multiple myeloma.  His last clinic visit with [place] was on 06/25/2021. He notes no acute new symptoms since his last clinic visit. He has been tolerating his maintenance Revlimid 15 mg 3 weeks on 1 week off without any significant toxicities. Continues to be on his aspirin. Continues to be on acyclovir. No infection issues. No new focal bone pains. Grade 1 fatigue. Minimal intermittent loose stools. No bleeding issues. Labs done today reviewed in detail.  MEDICAL HISTORY:  Past  Medical History:  Diagnosis Date   BPH (benign prostatic hyperplasia) 2004   Constipation    Other pancytopenia ([place]) 05/04/2013   Thrombocytopenia, unspecified ([place]) 08/17/2013   Weight loss 05/04/2013    SURGICAL HISTORY: Past Surgical History:  Procedure Laterality Date   APPENDECTOMY     COLONOSCOPY WITH PROPOFOL N/A 07/17/2015   Procedure: COLONOSCOPY WITH PROPOFOL;  Surgeon: Garlan Fair, MD;  Location: [facility];  Service: Endoscopy;  Laterality: N/A;   CYSTOSCOPY WITH FULGERATION N/A 11/08/2020   Procedure: CYSTOSCOPY WITH FULGERATION OF BLEEDERS;  Surgeon: Irine Seal, MD;  Location: [facility];  Service: Urology;  Laterality: N/A;   CYSTOSCOPY WITH FULGERATION N/A 12/01/2020   Procedure: CYSTOSCOPY WITH FULGERATION OF  PROSTATE;  Surgeon: Irine Seal, MD;  Location: [facility];  Service: Urology;  Laterality: N/A;   CYSTOSCOPY WITH INSERTION OF UROLIFT N/A 08/22/2015   Procedure: CYSTOSCOPY WITH INSERTION OF UROLIFT;  Surgeon: Irine Seal, MD;  Location: [facility];  Service: Urology;  Laterality: N/A;   CYSTOSCOPY WITH LITHOLAPAXY N/A 10/24/2020   Procedure: CYSTOSCOPY WITH LITHOLAPAXY;  Surgeon: Irine Seal, MD;  Location: [facility];  Service: Urology;  Laterality: N/A;  REQUESTING 1 HR FOR CASE   EYE SURGERY     right cataract surgery with lens implant   TESTICLE SURGERY Right 2011   hydrocele   TRANSURETHRAL RESECTION OF PROSTATE N/A 10/24/2020   Procedure: TRANSURETHRAL RESECTION OF THE PROSTATE (TURP);  Surgeon: Irine Seal, MD;  Location: [facility];  Service: Urology;  Laterality: N/A;    SOCIAL HISTORY: Social History   Socioeconomic History   Marital status: Married  Spouse name: Not on file   Number of children: Not on file   Years of education: Not on file   Highest education level: Not on file  Occupational History   Not on file  Tobacco Use   Smoking status: Former    Packs/day: 1.50    Years: 30.00    Pack years: 45.00    Types: Cigarettes    Quit date: 07/30/1995     Years since quitting: 26.1   Smokeless tobacco: Never  Vaping Use   Vaping Use: Never used  Substance and Sexual Activity   Alcohol use: No   Drug use: No   Sexual activity: Not on file  Other Topics Concern   Not on file  Social History Narrative   Not on file   Social Determinants of Health   Financial Resource Strain: Not on file  Food Insecurity: Not on file  Transportation Needs: Not on file  Physical Activity: Not on file  Stress: Not on file  Social Connections: Not on file  Intimate Partner Violence: Not on file    FAMILY HISTORY: No family history on file.  ALLERGIES:  has No Known Allergies.  MEDICATIONS:  Current Outpatient Medications  Medication Sig Dispense Refill   acetaminophen (TYLENOL) 325 MG tablet Take 650 mg by mouth every 6 (six) hours as needed (for pain.).     acyclovir (ZOVIRAX) 400 MG tablet TAKE 1 TABLET(400 MG) BY MOUTH DAILY 30 tablet 3   aspirin EC 81 MG tablet Take 1 tablet (81 mg total) by mouth daily. 100 tablet 3   calcium carbonate (TUMS - DOSED IN MG ELEMENTAL CALCIUM) 500 MG chewable tablet Chew 1 tablet by mouth daily.     cholecalciferol (VITAMIN D3) 25 MCG (1000 UNIT) tablet Take 5,000 Units by mouth daily.     dexamethasone (DECADRON) 4 MG tablet Take 10 tablets (47m) once on day 22. Repeat every 28 days for the first 4 cycles. Take with breakfast. 40 tablet 4   doxazosin (CARDURA) 8 MG tablet Take 8 mg by mouth daily in the afternoon.     finasteride (PROSCAR) 5 MG tablet Take 5 mg by mouth daily in the afternoon.     lenalidomide (REVLIMID) 15 MG capsule TAKE 1 CAPSULE BY MOUTH EVERY DAY FOR 21 DAYS THEN OFF FOR 7 DAYS EVERY 28 DAYS 21 capsule 0   lidocaine-prilocaine (EMLA) cream Apply to affected area once 30 g 3   ondansetron (ZOFRAN) 8 MG tablet Take 1 tablet (8 mg total) by mouth 2 (two) times daily as needed (Nausea or vomiting). 30 tablet 1   pravastatin (PRAVACHOL) 40 MG tablet Take 40 mg by mouth daily before supper.      prochlorperazine (COMPAZINE) 10 MG tablet Take 1 tablet (10 mg total) by mouth every 6 (six) hours as needed (Nausea or vomiting). 30 tablet 1   No current facility-administered medications for this visit.    REVIEW OF SYSTEMS:   .10 Point review of Systems was done is negative except as noted above.  PHYSICAL EXAMINATION: ECOG PERFORMANCE STATUS: 1 - Symptomatic but completely ambulatory  Vitals:   08/28/21 1339  BP: 128/62  Pulse: 61  Resp: 18  Temp: 97.8 F (36.6 C)  SpO2: 100%    Filed Weights   08/28/21 1339  Weight: 186 lb 6.4 oz (84.6 kg)   GENERAL:alert, in no acute distress and comfortable SKIN: no acute rashes, no significant lesions EYES: conjunctiva are pink and non-injected, sclera anicteric OROPHARYNX: MMM, no  exudates, no oropharyngeal erythema or ulceration NECK: supple, no JVD LYMPH:  no palpable lymphadenopathy in the cervical, axillary or inguinal regions LUNGS: clear to auscultation b/l with normal respiratory effort HEART: regular rate & rhythm ABDOMEN:  normoactive bowel sounds , non tender, not distended. Extremity: no pedal edema PSYCH: alert & oriented x 3 with fluent speech NEURO: no focal motor/sensory deficits  LABORATORY DATA:   CBC Latest Ref Rng & Units 08/28/2021 06/25/2021 06/05/2021  WBC 4.0 - 10.5 K/uL 2.2(L) 2.4(L) 3.1(L)  Hemoglobin 13.0 - 17.0 g/dL 12.0(L) 10.9(L) 10.5(L)  Hematocrit 39.0 - 52.0 % 34.0(L) 32.8(L) 31.1(L)  Platelets 150 - 400 K/uL 87(L) 94(L) 86(L)  ANC of 900  CMP Latest Ref Rng & Units 08/28/2021 06/25/2021 06/05/2021  Glucose 70 - 99 mg/dL 111(H) 112(H) 110(H)  BUN 8 - 23 mg/dL '10 9 9  ' Creatinine 0.61 - 1.24 mg/dL 1.29(H) 1.18 1.13  Sodium 135 - 145 mmol/L 138 141 137  Potassium 3.5 - 5.1 mmol/L 3.7 4.0 3.7  Chloride 98 - 111 mmol/L 104 108 106  CO2 22 - 32 mmol/L '26 25 24  ' Calcium 8.9 - 10.3 mg/dL 9.1 8.7(L) 8.3(L)  Total Protein 6.5 - 8.1 g/dL 6.8 6.1(L) 5.7(L)  Total Bilirubin 0.3 - 1.2 mg/dL 1.1 0.9  1.3(H)  Alkaline Phos 38 - 126 U/L 50 57 57  AST 15 - 41 U/L 18 15 12(L)  ALT 0 - 44 U/L '16 12 12    ' Component     Latest Ref Rng & Units 08/28/2021  IgG (Immunoglobin G), Serum     603 - 1,613 mg/dL 906  IgA     61 - 437 mg/dL 218  IgM (Immunoglobulin M), Srm     15 - 143 mg/dL 28  Total Protein ELP     6.0 - 8.5 g/dL 6.5  Albumin SerPl Elph-Mcnc     2.9 - 4.4 g/dL 3.8  Alpha 1     0.0 - 0.4 g/dL 0.2  Alpha2 Glob SerPl Elph-Mcnc     0.4 - 1.0 g/dL 0.7  B-Globulin SerPl Elph-Mcnc     0.7 - 1.3 g/dL 0.8  Gamma Glob SerPl Elph-Mcnc     0.4 - 1.8 g/dL 0.9  M Protein SerPl Elph-Mcnc     Not Observed g/dL Not Observed  Globulin, Total     2.2 - 3.9 g/dL 2.7  Albumin/Glob SerPl     0.7 - 1.7 1.5  IFE 1      Comment  Please Note (HCV):      Comment  Kappa free light chain     3.3 - 19.4 mg/L 39.2 (H)  Lambda free light chains     5.7 - 26.3 mg/L 28.7 (H)  Kappa, lambda light chain ratio     0.26 - 1.65 1.37   05/18/2021: PET SCAN Interval resolution of the hypermetabolism seen previously in the posterior left iliac bone. 2. Tiny hypermetabolic focus in the region of the left adrenal gland without nodule or mass lesion evident on CT imaging. Close follow-up recommended. 3. Otherwise no new or suspicious hypermetabolism on today's study. 4.  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 Prostatomegaly  06/01/2021: Bone Marrow Biopsy: Surgical Pathology  CASE: [id number]  PATIENT: Brian Levine  Bone Marrow Report      Clinical History: History of multiple myeloma , (BH)   DIAGNOSIS:   BONE MARROW, ASPIRATE, CLOT, CORE:  -Hypercellular bone marrow for age with trilineage hematopoiesis and 1%  plasma cells  -See comment  PERIPHERAL BLOOD:  -Pancytopenia   COMMENT:   The bone marrow is hypercellular with trilineage hematopoiesis and nonspecific myeloid changes likely related to therapy.  In this  background, the plasma cells represent 1% of all cells in the  aspirate associated with scattering of interstitial cells in the clot and biopsy sections with lack of large aggregates or sheets. The plasma cells display polyclonal staining pattern for kappa and lambda light chains and hence, there is no definitive or diagnostic evidence of residual plasma cell neoplasm.  Correlation with cytogenetic and FISH studies is recommended.   CLOT AND BIOPSY: The sections show cellularity ranging from 20 to 70%  with a mixture of myeloid cell types.  Large clusters or sheets of plasma cells are not identified.  Significant lymphoid aggregates are  not seen.  Immunohistochemical stain for CD138 and in situ hybridization  for kappa and lambda were performed on blocks B1 and C1 with appropriate  controls.  CD138 highlights an extremely minor plasma cell component mostly consisting of scattering of interstitial cells and displays  polyclonal staining pattern for kappa and lambda light chains.  Bone Marrow count performed on 500 cells shows:  Blasts:   0%   Myeloid:  39%  Promyelocytes: 0%   Erythroid:     52%  Myelocytes:    6%   Lymphocytes:   6%  Metamyelocytes:     3%   Plasma cells:  1%  Bands:    11%  Neutrophils:   14%  M:E ratio:     0.75  Eosinophils:   5%  Basophils:     0%  Monocytes:     2%   Lab Data: CBC performed on 06/01/21 shows:  WBC: 2.2 k/uL  Neutrophils:   56%  Hgb: 10.4 g/dL Lymphocytes:   26%  HCT: 31.0 %    Monocytes:     12%  MCV: 94.8 fL   Eosinophils:   4%  RDW: 14.2 %    Basophils:     2%  PLT: 135 k/uL     ASSESSMENT & PLAN:   76 yo with    1) Recurrent/persistent hematuria bleeding after prostate surgery. - resolved No fhx or previous personal h/o bleeding issues.  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 CBC with hemoglobin of 12, leukopenia with a WBC count of 2.2k with an ANC of 900, platelets of 87k CMP unremarkable Myeloma panel with no M spike Serum kappa lambda free light chains show normal ratio.  No appropriate toxicities from the patient's Revlimid at this time though he does have grade 1 fatigue and cytopenias including leukopenia and thrombocytopenia. We discussed cutting down his Revlimid dose to 10 mg 3 weeks on 1 week off for maintenance. He will continue aspirin 81 mg p.o. daily Continue acyclovir for 100 mg p.o. twice daily for at least 6 months after his last dose of carfilzomib. Recommended getting Shingrix vaccination if he has not received it already.  Continue on vitamin D 2000 units daily. No signs suggestive of multiple myeloma progression at this time we will continue to monitor Continue Zometa every 4 weeks  FOLLOW UP:  Please schedule next 4 doses of Zometa every 4 weeks with labs MD visit in 4 weeks. Follow-up  . The  total time spent in the appointment was 30 minutes on lab review, toxicity assessment of Revlimid, ordering and management of Revlimid and other supportive medications, ordering and management of Zometa and documentation  Sullivan Lone MD MS Hematology/Oncology Physician [facility]

## 2021-09-06 ENCOUNTER — Other Ambulatory Visit: Payer: Self-pay

## 2021-09-06 ENCOUNTER — Encounter: Payer: Self-pay | Admitting: Hematology

## 2021-09-06 DIAGNOSIS — C9000 Multiple myeloma not having achieved remission: Secondary | ICD-10-CM

## 2021-09-06 MED ORDER — LENALIDOMIDE 10 MG PO CAPS: 10.0000 mg | ORAL_CAPSULE | Freq: Every day | ORAL | 0 refills | Status: DC

## 2021-09-07 ENCOUNTER — Other Ambulatory Visit: Payer: Self-pay

## 2021-09-07 DIAGNOSIS — C9000 Multiple myeloma not having achieved remission: Secondary | ICD-10-CM

## 2021-09-07 MED ORDER — LENALIDOMIDE 10 MG PO CAPS: 10.0000 mg | ORAL_CAPSULE | Freq: Every day | ORAL | 0 refills | Status: DC

## 2021-09-25 ENCOUNTER — Other Ambulatory Visit: Payer: Self-pay

## 2021-09-25 DIAGNOSIS — C9000 Multiple myeloma not having achieved remission: Secondary | ICD-10-CM

## 2021-09-26 ENCOUNTER — Inpatient Hospital Stay: Payer: Medicare HMO | Attending: Hematology

## 2021-09-26 ENCOUNTER — Other Ambulatory Visit: Payer: Self-pay

## 2021-09-26 ENCOUNTER — Inpatient Hospital Stay: Payer: Medicare HMO | Admitting: Hematology

## 2021-09-26 ENCOUNTER — Inpatient Hospital Stay: Payer: Medicare HMO

## 2021-09-26 VITALS — BP 137/67 | HR 63 | Temp 97.5°F | Resp 18 | Wt 186.1 lb

## 2021-09-26 DIAGNOSIS — D61818 Other pancytopenia: Secondary | ICD-10-CM | POA: Diagnosis not present

## 2021-09-26 DIAGNOSIS — Z9079 Acquired absence of other genital organ(s): Secondary | ICD-10-CM | POA: Diagnosis not present

## 2021-09-26 DIAGNOSIS — C9000 Multiple myeloma not having achieved remission: Secondary | ICD-10-CM

## 2021-09-26 DIAGNOSIS — N400 Enlarged prostate without lower urinary tract symptoms: Secondary | ICD-10-CM | POA: Diagnosis not present

## 2021-09-26 DIAGNOSIS — Z7982 Long term (current) use of aspirin: Secondary | ICD-10-CM | POA: Insufficient documentation

## 2021-09-26 DIAGNOSIS — Z7952 Long term (current) use of systemic steroids: Secondary | ICD-10-CM | POA: Insufficient documentation

## 2021-09-26 DIAGNOSIS — Z79899 Other long term (current) drug therapy: Secondary | ICD-10-CM | POA: Insufficient documentation

## 2021-09-26 LAB — CMP (CANCER CENTER ONLY)
ALT: 19 U/L (ref 0–44)
AST: 20 U/L (ref 15–41)
Albumin: 4 g/dL (ref 3.5–5.0)
Alkaline Phosphatase: 58 U/L (ref 38–126)
Anion gap: 7 (ref 5–15)
BUN: 10 mg/dL (ref 8–23)
CO2: 26 mmol/L (ref 22–32)
Calcium: 9.4 mg/dL (ref 8.9–10.3)
Chloride: 106 mmol/L (ref 98–111)
Creatinine: 1.38 mg/dL — ABNORMAL HIGH (ref 0.61–1.24)
GFR, Estimated: 53 mL/min — ABNORMAL LOW (ref >60)
Glucose, Bld: 105 mg/dL — ABNORMAL HIGH (ref 70–99)
Potassium: 3.9 mmol/L (ref 3.5–5.1)
Sodium: 139 mmol/L (ref 135–145)
Total Bilirubin: 1.2 mg/dL (ref 0.3–1.2)
Total Protein: 6.9 g/dL (ref 6.5–8.1)

## 2021-09-26 LAB — CBC WITH DIFFERENTIAL/PLATELET
Abs Immature Granulocytes: 0 10*3/uL (ref 0.00–0.07)
Basophils Absolute: 0 10*3/uL (ref 0.0–0.1)
Basophils Relative: 1 %
Eosinophils Absolute: 0.1 10*3/uL (ref 0.0–0.5)
Eosinophils Relative: 6 %
HCT: 35.1 % — ABNORMAL LOW (ref 39.0–52.0)
Hemoglobin: 12.1 g/dL — ABNORMAL LOW (ref 13.0–17.0)
Immature Granulocytes: 0 %
Lymphocytes Relative: 40 %
Lymphs Abs: 0.9 10*3/uL (ref 0.7–4.0)
MCH: 29.1 pg (ref 26.0–34.0)
MCHC: 34.5 g/dL (ref 30.0–36.0)
MCV: 84.4 fL (ref 80.0–100.0)
Monocytes Absolute: 0.1 10*3/uL (ref 0.1–1.0)
Monocytes Relative: 4 %
Neutro Abs: 1.1 10*3/uL — ABNORMAL LOW (ref 1.7–7.7)
Neutrophils Relative %: 49 %
Platelets: 92 10*3/uL — ABNORMAL LOW (ref 150–400)
RBC: 4.16 MIL/uL — ABNORMAL LOW (ref 4.22–5.81)
RDW: 15 % (ref 11.5–15.5)
WBC: 2.3 10*3/uL — ABNORMAL LOW (ref 4.0–10.5)
nRBC: 0 % (ref 0.0–0.2)

## 2021-09-26 MED ORDER — SODIUM CHLORIDE 0.9 % IV SOLN: Freq: Once | INTRAVENOUS | Status: AC

## 2021-09-26 MED ORDER — ZOLEDRONIC ACID 4 MG/100ML IV SOLN
4.0000 mg | Freq: Once | INTRAVENOUS | Status: AC
  Administered 2021-09-26: 4 mg via INTRAVENOUS
  Filled 2021-09-26: qty 100

## 2021-09-26 NOTE — Patient Instructions (Signed)

## 2021-09-27 LAB — KAPPA/LAMBDA LIGHT CHAINS
Kappa free light chain: 40.1 mg/L — ABNORMAL HIGH (ref 3.3–19.4)
Kappa, lambda light chain ratio: 1.29 (ref 0.26–1.65)
Lambda free light chains: 31 mg/L — ABNORMAL HIGH (ref 5.7–26.3)

## 2021-09-29 ENCOUNTER — Other Ambulatory Visit: Payer: Self-pay | Admitting: Hematology

## 2021-09-29 DIAGNOSIS — C9000 Multiple myeloma not having achieved remission: Secondary | ICD-10-CM

## 2021-10-01 LAB — MULTIPLE MYELOMA PANEL, SERUM
Albumin SerPl Elph-Mcnc: 3.7 g/dL (ref 2.9–4.4)
Albumin/Glob SerPl: 1.4 (ref 0.7–1.7)
Alpha 1: 0.2 g/dL (ref 0.0–0.4)
Alpha2 Glob SerPl Elph-Mcnc: 0.7 g/dL (ref 0.4–1.0)
B-Globulin SerPl Elph-Mcnc: 0.9 g/dL (ref 0.7–1.3)
Gamma Glob SerPl Elph-Mcnc: 1 g/dL (ref 0.4–1.8)
Globulin, Total: 2.8 g/dL (ref 2.2–3.9)
IgA: 262 mg/dL (ref 61–437)
IgG (Immunoglobin G), Serum: 958 mg/dL (ref 603–1613)
IgM (Immunoglobulin M), Srm: 30 mg/dL (ref 15–143)
Total Protein ELP: 6.5 g/dL (ref 6.0–8.5)

## 2021-10-02 ENCOUNTER — Encounter: Payer: Self-pay | Admitting: Hematology

## 2021-10-02 NOTE — Progress Notes (Addendum)
[place] FOLLOW-UP progress notes  DOS .Marland Kitchen03/07/2021   Patient Care Team: Lavone Orn, MD as PCP - General (Internal Medicine)  CHIEF COMPLAINTS/PURPOSE OF VISIT:  Follow-up for continued evaluation and management of multiple myeloma  HISTORY OF PRESENTING ILLNESS:  Please see previous notes for details on initial presentation.  INTERVAL HISTORY  Mr. Hunger is here for continued evaluation and management of multiple myeloma. He notes no acute issues since his last clinic visit. No dental issues. No fevers no chills no night sweats. No unexpected weight loss. No diarrhea..  Tolerating maintenance Revlimid at 10 mg p.o. 3 weeks on 1 week off. Continues to take aspirin and acyclovir for supportive medications.  MEDICAL HISTORY:  Past Medical History:  Diagnosis Date   BPH (benign prostatic hyperplasia) 2004   Constipation    Other pancytopenia ([place]) 05/04/2013   Thrombocytopenia, unspecified ([place]) 08/17/2013   Weight loss 05/04/2013    SURGICAL HISTORY: Past Surgical History:  Procedure Laterality Date   APPENDECTOMY     COLONOSCOPY WITH PROPOFOL N/A 07/17/2015   Procedure: COLONOSCOPY WITH PROPOFOL;  Surgeon: Garlan Fair, MD;  Location: [facility];  Service: Endoscopy;  Laterality: N/A;   CYSTOSCOPY WITH FULGERATION N/A 11/08/2020   Procedure: CYSTOSCOPY WITH FULGERATION OF BLEEDERS;  Surgeon: Irine Seal, MD;  Location: [facility];  Service: Urology;  Laterality: N/A;   CYSTOSCOPY WITH FULGERATION N/A 12/01/2020   Procedure: CYSTOSCOPY WITH FULGERATION OF  PROSTATE;  Surgeon: Irine Seal, MD;  Location: [facility];  Service: Urology;  Laterality: N/A;   CYSTOSCOPY WITH INSERTION OF UROLIFT N/A 08/22/2015   Procedure: CYSTOSCOPY WITH INSERTION OF UROLIFT;  Surgeon: Irine Seal, MD;  [facility];  Service: Urology;  Laterality: N/A;   CYSTOSCOPY WITH LITHOLAPAXY N/A 10/24/2020   Procedure: CYSTOSCOPY WITH LITHOLAPAXY;  Surgeon: Irine Seal, MD;  Location: [facility];  Service: Urology;  Laterality: N/A;  REQUESTING 1 HR FOR CASE   EYE SURGERY     right cataract surgery with lens implant   TESTICLE SURGERY Right 2011   hydrocele   TRANSURETHRAL RESECTION OF PROSTATE N/A 10/24/2020   Procedure: TRANSURETHRAL RESECTION OF THE PROSTATE (TURP);  Surgeon: Irine Seal, MD;  Location: [facility];  Service: Urology;  Laterality: N/A;    SOCIAL HISTORY: Social History   Socioeconomic History   Marital status: Married    Spouse name: Not on file   Number of children: Not on file   Years of education: Not on file   Highest education level: Not on file  Occupational History   Not on file  Tobacco Use   Smoking status: Former    Packs/day: 1.50    Years: 30.00    Pack years: 45.00    Types: Cigarettes    Quit date: 07/30/1995    Years since quitting: 26.1   Smokeless tobacco: Never  Vaping Use   Vaping Use: Never used  Substance and Sexual Activity   Alcohol use: No   Drug use: No   Sexual activity: Not on file  Other Topics Concern   Not on file  Social History Narrative   Not on file   Social Determinants of Health   Financial Resource Strain: Not on file  Food Insecurity: Not on file  Transportation Needs: Not on file  Physical Activity: Not on file  Stress: Not on file  Social Connections: Not on file  Intimate Partner Violence: Not on file    FAMILY HISTORY: No family history on file.  ALLERGIES:  has No Known Allergies.  MEDICATIONS:  Current Outpatient Medications  Medication Sig Dispense Refill   acetaminophen (TYLENOL) 325 MG tablet Take 650 mg by mouth every 6 (six) hours as needed (for pain.).     acyclovir (ZOVIRAX) 400 MG tablet TAKE 1 TABLET(400 MG) BY MOUTH DAILY 30 tablet 3   aspirin EC 81 MG tablet Take 1 tablet (81 mg total) by mouth daily. 100 tablet 3   calcium carbonate (TUMS - DOSED IN MG ELEMENTAL CALCIUM) 500 MG chewable tablet Chew 1 tablet by mouth daily.     cholecalciferol (VITAMIN D3) 25 MCG (1000 UNIT)  tablet Take 5,000 Units by mouth daily.     dexamethasone (DECADRON) 4 MG tablet Take 10 tablets (37m) once on day 22. Repeat every 28 days for the first 4 cycles. Take with breakfast. 40 tablet 4   doxazosin (CARDURA) 8 MG tablet Take 8 mg by mouth daily in the afternoon.     finasteride (PROSCAR) 5 MG tablet Take 5 mg by mouth daily in the afternoon.     lenalidomide (REVLIMID) 10 MG capsule Take 1 capsule (10 mg total) by mouth daily. Take 1 tablet by mouth daily for days 1 - 21, then take 7 days off. 21 capsule 0   lidocaine-prilocaine (EMLA) cream Apply to affected area once 30 g 3   ondansetron (ZOFRAN) 8 MG tablet Take 1 tablet (8 mg total) by mouth 2 (two) times daily as needed (Nausea or vomiting). 30 tablet 1   pravastatin (PRAVACHOL) 40 MG tablet Take 40 mg by mouth daily before supper.     prochlorperazine (COMPAZINE) 10 MG tablet Take 1 tablet (10 mg total) by mouth every 6 (six) hours as needed (Nausea or vomiting). 30 tablet 1   No current facility-administered medications for this visit.    REVIEW OF SYSTEMS:     PHYSICAL EXAMINATION: ECOG PERFORMANCE STATUS: 1 - Symptomatic but completely ambulatory  Vitals:   09/26/21 1339  BP: 137/67  Pulse: 63  Resp: 18  Temp: (!) 97.5 F (36.4 C)  SpO2: 100%    Filed Weights   09/26/21 1339  Weight: 186 lb 1.6 oz (84.4 kg)   NAD GENERAL:alert, in no acute distress and comfortable SKIN: no acute rashes, no significant lesions EYES: conjunctiva are pink and non-injected, sclera anicteric OROPHARYNX: MMM, no exudates, no oropharyngeal erythema or ulceration NECK: supple, no JVD LYMPH:  no palpable lymphadenopathy in the cervical, axillary or inguinal regions LUNGS: clear to auscultation b/l with normal respiratory effort HEART: regular rate & rhythm ABDOMEN:  normoactive bowel sounds , non tender, not distended. Extremity: no pedal edema PSYCH: alert & oriented x 3 with fluent speech NEURO: no focal motor/sensory  deficits   LABORATORY DATA:   CBC Latest Ref Rng & Units 09/26/2021 08/28/2021 06/25/2021  WBC 4.0 - 10.5 K/uL 2.3(L) 2.2(L) 2.4(L)  Hemoglobin 13.0 - 17.0 g/dL 12.1(L) 12.0(L) 10.9(L)  Hematocrit 39.0 - 52.0 % 35.1(L) 34.0(L) 32.8(L)  Platelets 150 - 400 K/uL 92(L) 87(L) 94(L)  ANC of 1100  CMP Latest Ref Rng & Units 09/26/2021 08/28/2021 06/25/2021  Glucose 70 - 99 mg/dL 105(H) 111(H) 112(H)  BUN 8 - 23 mg/dL '10 10 9  ' Creatinine 0.61 - 1.24 mg/dL 1.38(H) 1.29(H) 1.18  Sodium 135 - 145 mmol/L 139 138 141  Potassium 3.5 - 5.1 mmol/L 3.9 3.7 4.0  Chloride 98 - 111 mmol/L 106 104 108  CO2 22 - 32 mmol/L '26 26 25  ' Calcium 8.9 - 10.3 mg/dL 9.4  9.1 8.7(L)  Total Protein 6.5 - 8.1 g/dL 6.9 6.8 6.1(L)  Total Bilirubin 0.3 - 1.2 mg/dL 1.2 1.1 0.9  Alkaline Phos 38 - 126 U/L 58 50 57  AST 15 - 41 U/L '20 18 15  ' ALT 0 - 44 U/L '19 16 12    ' Myeloma labs on 09/26/2021 within normal limits  ,not observed M protein   Component     Latest Ref Rng & Units 08/28/2021  IgG (Immunoglobin G), Serum     603 - 1,613 mg/dL 906  IgA     61 - 437 mg/dL 218  IgM (Immunoglobulin M), Srm     15 - 143 mg/dL 28  Total Protein ELP     6.0 - 8.5 g/dL 6.5  Albumin SerPl Elph-Mcnc     2.9 - 4.4 g/dL 3.8  Alpha 1     0.0 - 0.4 g/dL 0.2  Alpha2 Glob SerPl Elph-Mcnc     0.4 - 1.0 g/dL 0.7  B-Globulin SerPl Elph-Mcnc     0.7 - 1.3 g/dL 0.8  Gamma Glob SerPl Elph-Mcnc     0.4 - 1.8 g/dL 0.9  M Protein SerPl Elph-Mcnc     Not Observed g/dL Not Observed  Globulin, Total     2.2 - 3.9 g/dL 2.7  Albumin/Glob SerPl     0.7 - 1.7 1.5  IFE 1      Comment  Please Note (HCV):      Comment  Kappa free light chain     3.3 - 19.4 mg/L 39.2 (H)  Lambda free light chains     5.7 - 26.3 mg/L 28.7 (H)  Kappa, lambda light chain ratio     0.26 - 1.65 1.37   05/18/2021: PET SCAN Interval resolution of the hypermetabolism seen previously in the posterior left iliac bone. 2. Tiny hypermetabolic focus in the region  of the left adrenal gland without nodule or mass lesion evident on CT imaging. Close follow-up recommended. 3. Otherwise no new or suspicious hypermetabolism on today's study. 4.  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 Prostatomegaly  06/01/2021: Bone Marrow Biopsy: Surgical Pathology  CASE: [id number]  PATIENT: Brian Levine  Bone Marrow Report      Clinical History: History of multiple myeloma , (BH)   DIAGNOSIS:   BONE MARROW, ASPIRATE, CLOT, CORE:  -Hypercellular bone marrow for age with trilineage hematopoiesis and 1%  plasma cells  -See comment   PERIPHERAL BLOOD:  -Pancytopenia   COMMENT:   The bone marrow is hypercellular with trilineage hematopoiesis and nonspecific myeloid changes likely related to therapy.  In this  background, the plasma cells represent 1% of all cells in the aspirate associated with scattering of interstitial cells in the clot and biopsy sections with lack of large aggregates or sheets. The plasma cells display polyclonal staining pattern for kappa and lambda light chains and hence, there is no definitive or diagnostic evidence of residual plasma cell neoplasm.  Correlation with cytogenetic and FISH studies is recommended.   CLOT AND BIOPSY: The sections show cellularity ranging from 20 to 70%  with a mixture of myeloid cell types.  Large clusters or sheets of plasma cells are not identified.  Significant lymphoid aggregates are  not seen.  Immunohistochemical stain for CD138 and in situ hybridization  for kappa and lambda were performed on blocks B1 and C1 with appropriate  controls.  CD138 highlights an extremely minor plasma cell component mostly consisting of scattering of interstitial cells and displays  polyclonal staining pattern  for kappa and lambda light chains.  Bone Marrow count performed on 500 cells shows:  Blasts:   0%   Myeloid:  39%  Promyelocytes: 0%   Erythroid:     52%  Myelocytes:    6%   Lymphocytes:   6%  Metamyelocytes:      3%   Plasma cells:  1%  Bands:    11%  Neutrophils:   14%  M:E ratio:     0.75  Eosinophils:   5%  Basophils:     0%  Monocytes:     2%   Lab Data: CBC performed on 06/01/21 shows:  WBC: 2.2 k/uL  Neutrophils:   56%  Hgb: 10.4 g/dL Lymphocytes:   26%  HCT: 31.0 %    Monocytes:     12%  MCV: 94.8 fL   Eosinophils:   4%  RDW: 14.2 %    Basophils:     2%  PLT: 135 k/uL     ASSESSMENT & PLAN:   76 yo with    1) Recurrent/persistent hematuria bleeding after prostate surgery. - resolved No fhx or previous personal h/o bleeding issues.  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 -CBC shows hemoglobin of 12.1, leukopenia of 2.3k with an ANC of 1100 and platelets of 92k CMP stable creatinine of 1.38 otherwise unremarkable -Kappa lambda free light chains show normal ratio Myeloma panel shows no M spike -No other prohibitive toxicities other than grade 1 fatigue related to Revlimid -We will continue Revlimid 10 mg 3 weeks on 1 week off with the plan to reduce dose further if the patient continues to have significant neutropenia or thrombocytopenia. -Patient has no lab or clinical symptoms suggestive of myeloma progression at this time. -Continue vitamin D 2000 units daily -Continue aspirin 81 mg p.o. daily -Continue Zometa every 4 weeks   FOLLOW UP:  Please schedule next 3 doses of Zometa every 4 weeks with labs MD visit in 8 weeks.   The total time spent in the appointment was 23 minutes*.  All of the patient's questions were answered with apparent satisfaction. The patient knows to call the clinic with any problems, questions or concerns.   Sullivan Lone MD MS AAHIVMS [facility] Hematology/Oncology Physician [facility]  .*Total Encounter  Time as defined by the Centers for Medicare and Medicaid Services includes, in addition to the face-to-face time of a patient visit (documented in the note above) non-face-to-face time: obtaining and reviewing outside history, ordering and reviewing medications, tests or procedures, care coordination (communications with other health care professionals or caregivers) and documentation in the medical record.

## 2021-10-03 ENCOUNTER — Other Ambulatory Visit: Payer: Self-pay

## 2021-10-03 ENCOUNTER — Encounter: Payer: Self-pay | Admitting: Hematology

## 2021-10-07 ENCOUNTER — Other Ambulatory Visit: Payer: Self-pay | Admitting: Hematology

## 2021-10-07 DIAGNOSIS — C9000 Multiple myeloma not having achieved remission: Secondary | ICD-10-CM

## 2021-10-07 DIAGNOSIS — Z7189 Other specified counseling: Secondary | ICD-10-CM

## 2021-10-08 ENCOUNTER — Encounter: Payer: Self-pay | Admitting: Hematology

## 2021-10-23 ENCOUNTER — Inpatient Hospital Stay: Payer: Medicare HMO

## 2021-10-23 ENCOUNTER — Ambulatory Visit: Payer: Medicare HMO

## 2021-10-23 ENCOUNTER — Other Ambulatory Visit: Payer: Self-pay

## 2021-10-23 VITALS — BP 138/68 | HR 57 | Temp 98.3°F | Resp 18

## 2021-10-23 DIAGNOSIS — D61818 Other pancytopenia: Secondary | ICD-10-CM | POA: Diagnosis not present

## 2021-10-23 DIAGNOSIS — N400 Enlarged prostate without lower urinary tract symptoms: Secondary | ICD-10-CM | POA: Diagnosis not present

## 2021-10-23 DIAGNOSIS — Z7952 Long term (current) use of systemic steroids: Secondary | ICD-10-CM | POA: Diagnosis not present

## 2021-10-23 DIAGNOSIS — Z9079 Acquired absence of other genital organ(s): Secondary | ICD-10-CM | POA: Diagnosis not present

## 2021-10-23 DIAGNOSIS — C9000 Multiple myeloma not having achieved remission: Secondary | ICD-10-CM | POA: Diagnosis not present

## 2021-10-23 DIAGNOSIS — Z79899 Other long term (current) drug therapy: Secondary | ICD-10-CM | POA: Diagnosis not present

## 2021-10-23 DIAGNOSIS — Z7982 Long term (current) use of aspirin: Secondary | ICD-10-CM | POA: Diagnosis not present

## 2021-10-23 LAB — CBC WITH DIFFERENTIAL/PLATELET
Abs Immature Granulocytes: 0 10*3/uL (ref 0.00–0.07)
Basophils Absolute: 0 10*3/uL (ref 0.0–0.1)
Basophils Relative: 1 %
Eosinophils Absolute: 0.1 10*3/uL (ref 0.0–0.5)
Eosinophils Relative: 5 %
HCT: 33.8 % — ABNORMAL LOW (ref 39.0–52.0)
Hemoglobin: 11.6 g/dL — ABNORMAL LOW (ref 13.0–17.0)
Immature Granulocytes: 0 %
Lymphocytes Relative: 47 %
Lymphs Abs: 1 10*3/uL (ref 0.7–4.0)
MCH: 29.1 pg (ref 26.0–34.0)
MCHC: 34.3 g/dL (ref 30.0–36.0)
MCV: 84.9 fL (ref 80.0–100.0)
Monocytes Absolute: 0.2 10*3/uL (ref 0.1–1.0)
Monocytes Relative: 7 %
Neutro Abs: 0.9 10*3/uL — ABNORMAL LOW (ref 1.7–7.7)
Neutrophils Relative %: 40 %
Platelets: 86 10*3/uL — ABNORMAL LOW (ref 150–400)
RBC: 3.98 MIL/uL — ABNORMAL LOW (ref 4.22–5.81)
RDW: 15 % (ref 11.5–15.5)
WBC: 2.2 10*3/uL — ABNORMAL LOW (ref 4.0–10.5)
nRBC: 0 % (ref 0.0–0.2)

## 2021-10-23 LAB — CMP (CANCER CENTER ONLY)
ALT: 15 U/L (ref 0–44)
AST: 17 U/L (ref 15–41)
Albumin: 3.9 g/dL (ref 3.5–5.0)
Alkaline Phosphatase: 53 U/L (ref 38–126)
Anion gap: 7 (ref 5–15)
BUN: 10 mg/dL (ref 8–23)
CO2: 26 mmol/L (ref 22–32)
Calcium: 9 mg/dL (ref 8.9–10.3)
Chloride: 106 mmol/L (ref 98–111)
Creatinine: 1.38 mg/dL — ABNORMAL HIGH (ref 0.61–1.24)
GFR, Estimated: 53 mL/min — ABNORMAL LOW (ref >60)
Glucose, Bld: 108 mg/dL — ABNORMAL HIGH (ref 70–99)
Potassium: 3.9 mmol/L (ref 3.5–5.1)
Sodium: 139 mmol/L (ref 135–145)
Total Bilirubin: 1.1 mg/dL (ref 0.3–1.2)
Total Protein: 6.7 g/dL (ref 6.5–8.1)

## 2021-10-23 MED ORDER — ZOLEDRONIC ACID 4 MG/100ML IV SOLN
4.0000 mg | Freq: Once | INTRAVENOUS | Status: AC
  Administered 2021-10-23: 4 mg via INTRAVENOUS
  Filled 2021-10-23: qty 100

## 2021-10-23 NOTE — Patient Instructions (Signed)

## 2021-10-25 ENCOUNTER — Other Ambulatory Visit: Payer: Self-pay | Admitting: Hematology

## 2021-10-25 DIAGNOSIS — C9000 Multiple myeloma not having achieved remission: Secondary | ICD-10-CM

## 2021-10-29 ENCOUNTER — Encounter: Payer: Self-pay | Admitting: Hematology

## 2021-10-29 ENCOUNTER — Other Ambulatory Visit: Payer: Self-pay

## 2021-11-17 ENCOUNTER — Other Ambulatory Visit: Payer: Self-pay | Admitting: Hematology

## 2021-11-17 DIAGNOSIS — C9000 Multiple myeloma not having achieved remission: Secondary | ICD-10-CM

## 2021-11-19 ENCOUNTER — Other Ambulatory Visit: Payer: Self-pay

## 2021-11-19 DIAGNOSIS — C9000 Multiple myeloma not having achieved remission: Secondary | ICD-10-CM

## 2021-11-20 ENCOUNTER — Inpatient Hospital Stay: Payer: Medicare HMO

## 2021-11-20 ENCOUNTER — Ambulatory Visit: Payer: Medicare HMO | Admitting: Hematology

## 2021-11-20 ENCOUNTER — Other Ambulatory Visit: Payer: Medicare HMO

## 2021-11-20 ENCOUNTER — Inpatient Hospital Stay: Payer: Medicare HMO | Admitting: Hematology

## 2021-11-20 ENCOUNTER — Other Ambulatory Visit: Payer: Self-pay

## 2021-11-20 ENCOUNTER — Inpatient Hospital Stay: Payer: Medicare HMO | Attending: Hematology

## 2021-11-20 ENCOUNTER — Ambulatory Visit: Payer: Medicare HMO

## 2021-11-20 VITALS — BP 122/49 | HR 53 | Temp 97.9°F | Resp 20 | Wt 186.5 lb

## 2021-11-20 DIAGNOSIS — D696 Thrombocytopenia, unspecified: Secondary | ICD-10-CM | POA: Diagnosis not present

## 2021-11-20 DIAGNOSIS — N182 Chronic kidney disease, stage 2 (mild): Secondary | ICD-10-CM | POA: Diagnosis not present

## 2021-11-20 DIAGNOSIS — C9000 Multiple myeloma not having achieved remission: Secondary | ICD-10-CM | POA: Insufficient documentation

## 2021-11-20 DIAGNOSIS — Z79899 Other long term (current) drug therapy: Secondary | ICD-10-CM | POA: Insufficient documentation

## 2021-11-20 DIAGNOSIS — Z87891 Personal history of nicotine dependence: Secondary | ICD-10-CM | POA: Insufficient documentation

## 2021-11-20 DIAGNOSIS — Z7982 Long term (current) use of aspirin: Secondary | ICD-10-CM | POA: Insufficient documentation

## 2021-11-20 DIAGNOSIS — D709 Neutropenia, unspecified: Secondary | ICD-10-CM | POA: Insufficient documentation

## 2021-11-20 LAB — CMP (CANCER CENTER ONLY)
ALT: 16 U/L (ref 0–44)
AST: 20 U/L (ref 15–41)
Albumin: 4 g/dL (ref 3.5–5.0)
Alkaline Phosphatase: 53 U/L (ref 38–126)
Anion gap: 6 (ref 5–15)
BUN: 8 mg/dL (ref 8–23)
CO2: 26 mmol/L (ref 22–32)
Calcium: 8.9 mg/dL (ref 8.9–10.3)
Chloride: 107 mmol/L (ref 98–111)
Creatinine: 1.39 mg/dL — ABNORMAL HIGH (ref 0.61–1.24)
GFR, Estimated: 53 mL/min — ABNORMAL LOW (ref >60)
Glucose, Bld: 94 mg/dL (ref 70–99)
Potassium: 4 mmol/L (ref 3.5–5.1)
Sodium: 139 mmol/L (ref 135–145)
Total Bilirubin: 1.1 mg/dL (ref 0.3–1.2)
Total Protein: 6.9 g/dL (ref 6.5–8.1)

## 2021-11-20 LAB — CBC WITH DIFFERENTIAL/PLATELET
Abs Immature Granulocytes: 0 10*3/uL (ref 0.00–0.07)
Basophils Absolute: 0 10*3/uL (ref 0.0–0.1)
Basophils Relative: 1 %
Eosinophils Absolute: 0.1 10*3/uL (ref 0.0–0.5)
Eosinophils Relative: 6 %
HCT: 33.5 % — ABNORMAL LOW (ref 39.0–52.0)
Hemoglobin: 11.7 g/dL — ABNORMAL LOW (ref 13.0–17.0)
Immature Granulocytes: 0 %
Lymphocytes Relative: 42 %
Lymphs Abs: 0.8 10*3/uL (ref 0.7–4.0)
MCH: 29.9 pg (ref 26.0–34.0)
MCHC: 34.9 g/dL (ref 30.0–36.0)
MCV: 85.7 fL (ref 80.0–100.0)
Monocytes Absolute: 0.1 10*3/uL (ref 0.1–1.0)
Monocytes Relative: 6 %
Neutro Abs: 0.9 10*3/uL — ABNORMAL LOW (ref 1.7–7.7)
Neutrophils Relative %: 45 %
Platelets: 78 10*3/uL — ABNORMAL LOW (ref 150–400)
RBC: 3.91 MIL/uL — ABNORMAL LOW (ref 4.22–5.81)
RDW: 14.8 % (ref 11.5–15.5)
WBC: 1.9 10*3/uL — ABNORMAL LOW (ref 4.0–10.5)
nRBC: 0 % (ref 0.0–0.2)

## 2021-11-20 MED ORDER — ZOLEDRONIC ACID 4 MG/100ML IV SOLN
4.0000 mg | Freq: Once | INTRAVENOUS | Status: AC
  Administered 2021-11-20: 4 mg via INTRAVENOUS
  Filled 2021-11-20: qty 100

## 2021-11-20 MED ORDER — SODIUM CHLORIDE 0.9 % IV SOLN: Freq: Once | INTRAVENOUS | Status: AC

## 2021-11-20 MED ORDER — B COMPLEX VITAMINS PO CAPS: 1.0000 | ORAL_CAPSULE | Freq: Every day | ORAL | 11 refills | Status: DC

## 2021-11-20 NOTE — Patient Instructions (Signed)

## 2021-11-20 NOTE — Progress Notes (Signed)
?HEMATOLOGY/ONCOLOGY CLINIC NOTE ? ?DOS 11/20/2021 ? ? ?Patient Care Team: ?Lavone Orn, MD as PCP - General (Internal Medicine) ? ?CHIEF COMPLAINTS/PURPOSE OF VISIT:  ?Follow-up for continued evaluation and management of multiple myeloma ? ?HISTORY OF PRESENTING ILLNESS:  ?Please see previous notes for details on initial presentation. ? ?INTERVAL HISTORY ? ?Brian Levine is a 76 y.o. male here for continued evaluation and management of multiple myeloma. He reports He is doing well with no new symptoms or concerns. ? ?He reports he continues to take 5000 units vitamin D daily. ? ?We discussed starting a vitamin B complex. If despite taking the vitamin B complex, his neutropenia and thrombocytopenia worsen/ do not improve then we will reduce Revlimid dose to 5 mg which he was agreeable to. ? ?No dental issues. ?No fevers no chills no night sweats. ?No diarrhea. ?No new lumps, bumps, or lesions/rashes. ?No abdominal pain or change in bowel habits. ?No new or unexpected weight loss. ?No SOB or chest pain. ?No other new or acute focal symptoms. ? ?Tolerating maintenance Revlimid at 10 mg p.o. 3 weeks on 1 week off. ?Continues to take aspirin and acyclovir for supportive medications. ? ?We discussed labs done today in detail. ?CBC shows persistent neutropenia with WBC of 1.9, ANC 900, consistent thrombocytopenia with platelet count of 78K/uL. RBC count of 3.91, Hgb of 11.7g/dL. HCT 33.5%. ?CMP WNL except for elevated creatinine of 1.39 and decreased GFR est of 53 consistent with mild chronic kidney disease. ? ?MEDICAL HISTORY:  ?Past Medical History:  ?Diagnosis Date  ? BPH (benign prostatic hyperplasia) 2004  ? Constipation   ? Other pancytopenia ([place]) 05/04/2013  ? Thrombocytopenia, unspecified ([place]) 08/17/2013  ? Weight loss 05/04/2013  ? ? ?SURGICAL HISTORY: ?Past Surgical History:  ?Procedure Laterality Date  ? APPENDECTOMY    ? COLONOSCOPY WITH PROPOFOL N/A 07/17/2015  ? Procedure: COLONOSCOPY WITH PROPOFOL;  Surgeon:  Garlan Fair, MD;  [facility];  Service: Endoscopy;  Laterality: N/A;  ? CYSTOSCOPY WITH FULGERATION N/A 11/08/2020  ? Procedure: CYSTOSCOPY WITH FULGERATION OF BLEEDERS;  Surgeon: Irine Seal, MD;  [facility];  Service: Urology;  Laterality: N/A;  ? [place] N/A 12/01/2020  ? Procedure: CYSTOSCOPY WITH FULGERATION OF  PROSTATE;  Surgeon: Irine Seal, MD;  Location[facility];  Service: Urology;  Laterality: N/A;  ? CYSTOSCOPY WITH INSERTION OF UROLIFT N/A 08/22/2015  ? Procedure: CYSTOSCOPY WITH INSERTION OF UROLIFT;  Surgeon: Irine Seal, MD;  [facility];  Service: Urology;  Laterality: N/A;  ? CYSTOSCOPY WITH LITHOLAPAXY N/A 10/24/2020  ? Procedure: CYSTOSCOPY WITH LITHOLAPAXY;  Surgeon: Irine Seal, MD;  [facility];  Service: Urology;  Laterality: N/A;  REQUESTING 1 HR FOR CASE  ? EYE SURGERY    ? right cataract surgery with lens implant  ? TESTICLE SURGERY Right 2011  ? hydrocele  ? TRANSURETHRAL RESECTION OF PROSTATE N/A 10/24/2020  ? Procedure: TRANSURETHRAL RESECTION OF THE PROSTATE (TURP);  Surgeon: Irine Seal, MD;  [facility];  Service: Urology;  Laterality: N/A;  ? ? ?SOCIAL HISTORY: ?Social History  ? ?Socioeconomic History  ? Marital status: Married  ?  Spouse name: Not on file  ? Number of children: Not on file  ? Years of education: Not on file  ? Highest education level: Not on file  ?Occupational History  ? Not on file  ?Tobacco Use  ? Smoking status: Former  ?  Packs/day: 1.50  ?  Years: 30.00  ?  Pack years: 45.00  ?  Types: Cigarettes  ?  Quit date: 07/30/1995  ?  Years since quitting: 26.3  ? Smokeless tobacco: Never  ?Vaping Use  ? Vaping Use: Never used  ?Substance and Sexual Activity  ? Alcohol use: No  ? Drug use: No  ? Sexual activity: Not on file  ?Other Topics Concern  ? Not on file  ?Social History Narrative  ? Not on file  ? ?Social Determinants of Health  ? ?Financial Resource Strain: Not on file  ?Food Insecurity: Not on file   ?Transportation Needs: Not on file  ?Physical Activity: Not on file  ?Stress: Not on file  ?Social Connections: Not on file  ?Intimate Partner Violence: Not on file  ? ? ?FAMILY HISTORY: ?No family history on file. ? ?ALLERGIES:  has No Known Allergies. ? ?MEDICATIONS:  ?Current Outpatient Medications  ?Medication Sig Dispense Refill  ? acetaminophen (TYLENOL) 325 MG tablet Take 650 mg by mouth every 6 (six) hours as needed (for pain.).    ? acyclovir (ZOVIRAX) 400 MG tablet TAKE 1 TABLET(400 MG) BY MOUTH DAILY 90 tablet 1  ? aspirin EC 81 MG tablet Take 1 tablet (81 mg total) by mouth daily. 100 tablet 3  ? calcium carbonate (TUMS - DOSED IN MG ELEMENTAL CALCIUM) 500 MG chewable tablet Chew 1 tablet by mouth daily.    ? cholecalciferol (VITAMIN D3) 25 MCG (1000 UNIT) tablet Take 5,000 Units by mouth daily.    ? doxazosin (CARDURA) 8 MG tablet Take 8 mg by mouth daily in the afternoon.    ? finasteride (PROSCAR) 5 MG tablet Take 5 mg by mouth daily in the afternoon.    ? lenalidomide (REVLIMID) 10 MG capsule TAKE 1 CAPSULE BY MOUTH EVERY DAY ON DAYS 1-21 THEN TAKE 7 DAYS OFF 21 capsule 0  ? pravastatin (PRAVACHOL) 40 MG tablet Take 40 mg by mouth daily before supper.    ? dexamethasone (DECADRON) 4 MG tablet Take 10 tablets (66m) once on day 22. Repeat every 28 days for the first 4 cycles. Take with breakfast. (Patient not taking: Reported on 11/20/2021) 40 tablet 4  ? lidocaine-prilocaine (EMLA) cream Apply to affected area once (Patient not taking: Reported on 11/20/2021) 30 g 3  ? ondansetron (ZOFRAN) 8 MG tablet Take 1 tablet (8 mg total) by mouth 2 (two) times daily as needed (Nausea or vomiting). (Patient not taking: Reported on 11/20/2021) 30 tablet 1  ? prochlorperazine (COMPAZINE) 10 MG tablet Take 1 tablet (10 mg total) by mouth every 6 (six) hours as needed (Nausea or vomiting). (Patient not taking: Reported on 11/20/2021) 30 tablet 1  ? ?No current facility-administered medications for this visit.   ? ? ?REVIEW OF SYSTEMS:   ? ? ?PHYSICAL EXAMINATION: ?ECOG PERFORMANCE STATUS: 1 - Symptomatic but completely ambulatory ? ?Vitals:  ? 11/20/21 1105  ?BP: (!) 122/49  ?Pulse: (!) 53  ?Resp: 20  ?Temp: 97.9 ?F (36.6 ?C)  ?SpO2: 100%  ? ? ?Filed Weights  ? 11/20/21 1105  ?Weight: 186 lb 8 oz (84.6 kg)  ?NAD ?GENERAL:alert, in no acute distress and comfortable ?SKIN: no acute rashes, no significant lesions ?EYES: conjunctiva are pink and non-injected, sclera anicteric ?NECK: supple, no JVD ?LYMPH:  no palpable lymphadenopathy in the cervical, axillary or inguinal regions ?LUNGS: clear to auscultation b/l with normal respiratory effort ?HEART: regular rate & rhythm ?ABDOMEN:  normoactive bowel sounds , non tender, not distended. ?Extremity: no pedal edema ?PSYCH: alert & oriented x 3 with fluent speech ?NEURO: no focal  motor/sensory deficits ? ?LABORATORY DATA:  ? ? ?  Latest Ref Rng & Units 11/20/2021  ?  9:57 AM 10/23/2021  ?  2:31 PM 09/26/2021  ? 12:59 PM  ?CBC  ?WBC 4.0 - 10.5 K/uL 1.9   2.2   2.3    ?Hemoglobin 13.0 - 17.0 g/dL 11.7   11.6   12.1    ?Hematocrit 39.0 - 52.0 % 33.5   33.8   35.1    ?Platelets 150 - 400 K/uL 78   86   92    ?ANC of 1100 ? ? ?  Latest Ref Rng & Units 11/20/2021  ?  9:57 AM 10/23/2021  ?  2:31 PM 09/26/2021  ? 12:59 PM  ?CMP  ?Glucose 70 - 99 mg/dL 94   108   105    ?BUN 8 - 23 mg/dL '8   10   10    ' ?Creatinine 0.61 - 1.24 mg/dL 1.39   1.38   1.38    ?Sodium 135 - 145 mmol/L 139   139   139    ?Potassium 3.5 - 5.1 mmol/L 4.0   3.9   3.9    ?Chloride 98 - 111 mmol/L 107   106   106    ?CO2 22 - 32 mmol/L '26   26   26    ' ?Calcium 8.9 - 10.3 mg/dL 8.9   9.0   9.4    ?Total Protein 6.5 - 8.1 g/dL 6.9   6.7   6.9    ?Total Bilirubin 0.3 - 1.2 mg/dL 1.1   1.1   1.2    ?Alkaline Phos 38 - 126 U/L 53   53   58    ?AST 15 - 41 U/L '20   17   20    ' ?ALT 0 - 44 U/L '16   15   19    ' ?  ?Myeloma labs on 09/26/2021 within normal limits  ,not observed M protein ? ? ?Component ?    Latest Ref Rng & Units  08/28/2021  ?IgG (Immunoglobin G), Serum ?    603 - 1,613 mg/dL 906  ?IgA ?    61 - 437 mg/dL 218  ?IgM (Immunoglobulin M), Srm ?    15 - 143 mg/dL 28  ?Total Protein ELP ?    6.0 - 8.5 g/dL 6.5  ?Albumin SerPl Elph-Mcnc ?    2.9

## 2021-11-21 LAB — KAPPA/LAMBDA LIGHT CHAINS
Kappa free light chain: 41.7 mg/L — ABNORMAL HIGH (ref 3.3–19.4)
Kappa, lambda light chain ratio: 1.43 (ref 0.26–1.65)
Lambda free light chains: 29.2 mg/L — ABNORMAL HIGH (ref 5.7–26.3)

## 2021-11-22 ENCOUNTER — Other Ambulatory Visit: Payer: Self-pay

## 2021-11-22 ENCOUNTER — Encounter: Payer: Self-pay | Admitting: Hematology

## 2021-11-22 LAB — MULTIPLE MYELOMA PANEL, SERUM
Albumin SerPl Elph-Mcnc: 3.6 g/dL (ref 2.9–4.4)
Albumin/Glob SerPl: 1.3 (ref 0.7–1.7)
Alpha 1: 0.3 g/dL (ref 0.0–0.4)
Alpha2 Glob SerPl Elph-Mcnc: 0.7 g/dL (ref 0.4–1.0)
B-Globulin SerPl Elph-Mcnc: 0.8 g/dL (ref 0.7–1.3)
Gamma Glob SerPl Elph-Mcnc: 1.1 g/dL (ref 0.4–1.8)
Globulin, Total: 2.8 g/dL (ref 2.2–3.9)
IgA: 277 mg/dL (ref 61–437)
IgG (Immunoglobin G), Serum: 989 mg/dL (ref 603–1613)
IgM (Immunoglobulin M), Srm: 26 mg/dL (ref 15–143)
Total Protein ELP: 6.4 g/dL (ref 6.0–8.5)

## 2021-11-23 ENCOUNTER — Encounter: Payer: Self-pay | Admitting: Hematology

## 2021-11-26 ENCOUNTER — Telehealth: Payer: Self-pay | Admitting: Hematology

## 2021-11-26 NOTE — Telephone Encounter (Signed)
Scheduled follow-up appointments per 4/25 los. Patient is aware. 

## 2021-11-28 IMAGING — CT NM PET TUM IMG INITIAL (PI) WHOLE BODY
1 of 7 series · 4 of 25 positions shown · non-contrast
Comparison: 05/12/2013 CT chest, abdomen and pelvis.

CLINICAL DATA: Initial treatment strategy for multiple myeloma.

EXAM:
NUCLEAR MEDICINE PET WHOLE BODY
TECHNIQUE: 9.1 mCi F-18 FDG was injected intravenously. Full-ring PET imaging
was performed from the head to foot after the radiotracer. CT data
was obtained and used for attenuation correction and anatomic
localization.
Fasting blood glucose: 104 mg/dl

[Series 5: ct wb 5.0 bf37 · axial · 5.0mm · 0.98mm/px · z∈[+72,+1288]mm · 4 of 500 slices shown]
[im 100/500  soft-tissue]
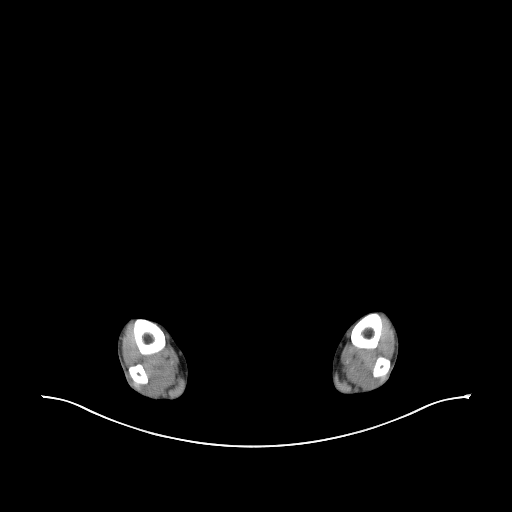
[im 200/500  soft-tissue]
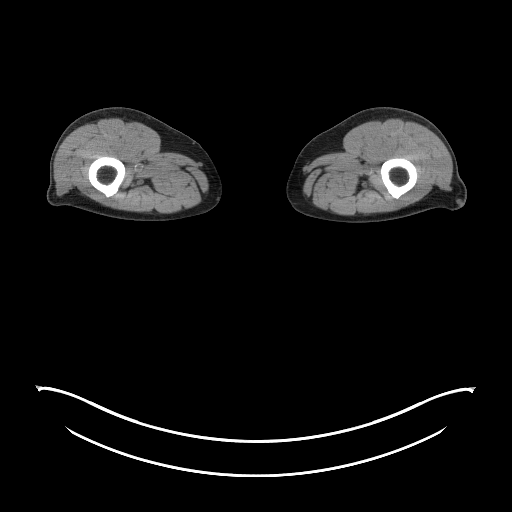
[im 300/500  soft-tissue]
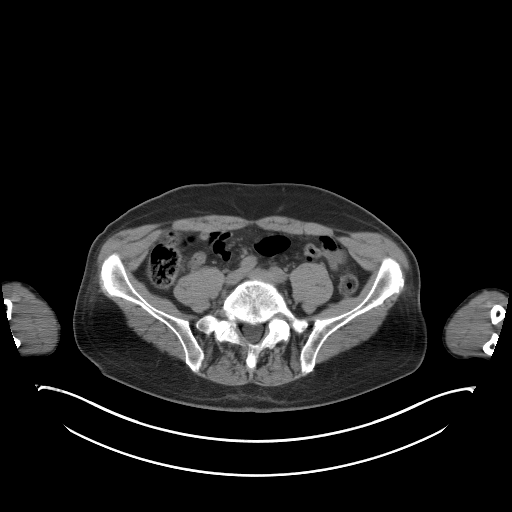
[im 400/500  soft-tissue]
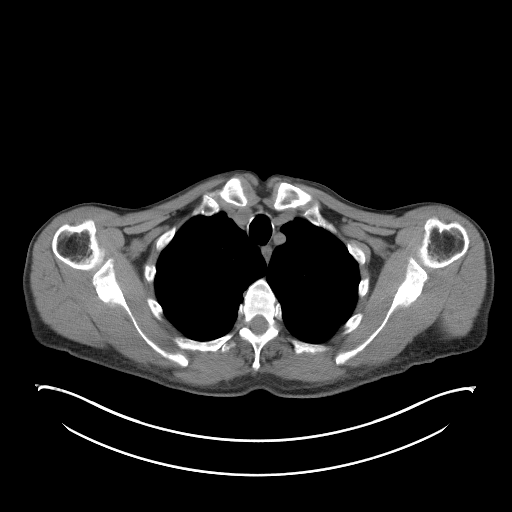

[4 of 25 positions shown; findings below may reference images not displayed]

FINDINGS: Mediastinal blood pool activity: SUV max

HEAD/NECK: No hypermetabolic activity in the scalp. No
hypermetabolic cervical lymph nodes.

Incidental CT findings: none

CHEST: No enlarged or hypermetabolic axillary, mediastinal or hilar
lymph nodes. No hypermetabolic pulmonary findings.

Incidental CT findings: Coronary atherosclerosis. Mildly
atherosclerotic nonaneurysmal thoracic aorta. Moderate centrilobular
and paraseptal emphysema with diffuse bronchial wall thickening. A
few scattered small pulmonary nodules measuring up to 4 mm in the
medial right upper lobe (series 9/image 22), below PET resolution,
all stable since 05/12/2013 chest CT and considered benign.

ABDOMEN/PELVIS: No abnormal hypermetabolic activity within the
liver, pancreas, adrenal glands, or spleen. No hypermetabolic lymph
nodes in the abdomen or pelvis.

Incidental CT findings: Moderate sigmoid diverticulosis.
Atherosclerotic nonaneurysmal abdominal aorta. Moderate
prostatomegaly with post-TURP changes. Apparent small to moderate
right hydrocele versus right epididymal spermatoceles.

SKELETON: Focus of indistinct mild hypermetabolism in the medial
left iliac bone with max SUV 3.7 without associated discrete osseous
lesion on the CT images. Otherwise no foci of skeletal
hypermetabolism.

Incidental CT findings: No suspicious focal osseous lesions.

EXTREMITIES: No abnormal hypermetabolic activity in the lower
extremities.

Incidental CT findings: none
IMPRESSION: 1. Indistinct mild focus of hypermetabolism in the medial left iliac
bone without associated discrete osseous lesion on the CT images.
Metabolically active myeloma cannot be excluded in this location.
2. Otherwise no additional potential sites of hypermetabolic
skeletal or extraosseous multiple myeloma.
3. Chronic findings include: Aortic Atherosclerosis (XUSPJ-BWL.L)
and Emphysema (XUSPJ-JGV.L). Coronary atherosclerosis. Moderate
prostatomegaly. Moderate sigmoid diverticulosis.

## 2021-12-16 ENCOUNTER — Other Ambulatory Visit: Payer: Self-pay | Admitting: Hematology

## 2021-12-16 DIAGNOSIS — C9000 Multiple myeloma not having achieved remission: Secondary | ICD-10-CM

## 2021-12-18 ENCOUNTER — Ambulatory Visit: Payer: Medicare HMO

## 2021-12-18 ENCOUNTER — Inpatient Hospital Stay: Payer: Medicare HMO | Attending: Hematology

## 2021-12-18 ENCOUNTER — Telehealth: Payer: Self-pay

## 2021-12-18 ENCOUNTER — Inpatient Hospital Stay: Payer: Medicare HMO

## 2021-12-18 VITALS — BP 137/64 | HR 55 | Temp 98.0°F | Resp 18

## 2021-12-18 DIAGNOSIS — C9000 Multiple myeloma not having achieved remission: Secondary | ICD-10-CM | POA: Insufficient documentation

## 2021-12-18 LAB — CMP (CANCER CENTER ONLY)
ALT: 18 U/L (ref 0–44)
AST: 20 U/L (ref 15–41)
Albumin: 3.8 g/dL (ref 3.5–5.0)
Alkaline Phosphatase: 51 U/L (ref 38–126)
Anion gap: 6 (ref 5–15)
BUN: 12 mg/dL (ref 8–23)
CO2: 26 mmol/L (ref 22–32)
Calcium: 9 mg/dL (ref 8.9–10.3)
Chloride: 106 mmol/L (ref 98–111)
Creatinine: 1.36 mg/dL — ABNORMAL HIGH (ref 0.61–1.24)
GFR, Estimated: 54 mL/min — ABNORMAL LOW (ref >60)
Glucose, Bld: 121 mg/dL — ABNORMAL HIGH (ref 70–99)
Potassium: 3.8 mmol/L (ref 3.5–5.1)
Sodium: 138 mmol/L (ref 135–145)
Total Bilirubin: 1.1 mg/dL (ref 0.3–1.2)
Total Protein: 6.7 g/dL (ref 6.5–8.1)

## 2021-12-18 LAB — CBC WITH DIFFERENTIAL/PLATELET
Abs Immature Granulocytes: 0 10*3/uL (ref 0.00–0.07)
Basophils Absolute: 0 10*3/uL (ref 0.0–0.1)
Basophils Relative: 2 %
Eosinophils Absolute: 0.1 10*3/uL (ref 0.0–0.5)
Eosinophils Relative: 6 %
HCT: 32.7 % — ABNORMAL LOW (ref 39.0–52.0)
Hemoglobin: 11.4 g/dL — ABNORMAL LOW (ref 13.0–17.0)
Immature Granulocytes: 0 %
Lymphocytes Relative: 46 %
Lymphs Abs: 1.1 10*3/uL (ref 0.7–4.0)
MCH: 29.7 pg (ref 26.0–34.0)
MCHC: 34.9 g/dL (ref 30.0–36.0)
MCV: 85.2 fL (ref 80.0–100.0)
Monocytes Absolute: 0.1 10*3/uL (ref 0.1–1.0)
Monocytes Relative: 6 %
Neutro Abs: 0.9 10*3/uL — ABNORMAL LOW (ref 1.7–7.7)
Neutrophils Relative %: 40 %
Platelets: 80 10*3/uL — ABNORMAL LOW (ref 150–400)
RBC: 3.84 MIL/uL — ABNORMAL LOW (ref 4.22–5.81)
RDW: 14.6 % (ref 11.5–15.5)
WBC: 2.3 10*3/uL — ABNORMAL LOW (ref 4.0–10.5)
nRBC: 0 % (ref 0.0–0.2)

## 2021-12-18 MED ORDER — B COMPLEX VITAMINS PO CAPS: 1.0000 | ORAL_CAPSULE | Freq: Every day | ORAL | 11 refills | Status: AC

## 2021-12-18 MED ORDER — ZOLEDRONIC ACID 4 MG/100ML IV SOLN
4.0000 mg | Freq: Once | INTRAVENOUS | Status: AC
  Administered 2021-12-18: 4 mg via INTRAVENOUS
  Filled 2021-12-18: qty 100

## 2021-12-18 MED ORDER — SODIUM CHLORIDE 0.9 % IV SOLN: Freq: Once | INTRAVENOUS | Status: AC

## 2021-12-18 NOTE — Patient Instructions (Signed)

## 2021-12-18 NOTE — Telephone Encounter (Signed)
Returned call to pt. Pt states he is experiencing numb/ting in bilateral feet. It has been happening over the last 4 weeks. Pt states  it is not causing any issue when he is walking, feet are not swollen or painful. Informed pt I would discuss with MD and give pt an update.

## 2021-12-18 NOTE — Telephone Encounter (Signed)
Contacted pt per Dr Irene Limbo about pt's issues with numb/ting in bilateral feet. Dr Irene Limbo suggested pt take B complex vitamin and increase calcium rich foods in his diet. Pt verbalized understanding. Dr Irene Limbo to re-evaluate at next visit. Pt acknowledged and will cal if s/s worsen.

## 2021-12-20 ENCOUNTER — Other Ambulatory Visit: Payer: Self-pay

## 2021-12-20 ENCOUNTER — Encounter: Payer: Self-pay | Admitting: Hematology

## 2021-12-31 ENCOUNTER — Telehealth: Payer: Self-pay

## 2021-12-31 NOTE — Telephone Encounter (Signed)
Returned call to pt. Pt stated he has been having numbness in his bilateral feet. Pt states over the las t week he has been having low back pain. Dr Irene Limbo suggested pt have an MRI to evaluate. Pt acknowledged and agreed. Will let Dr Irene Limbo know so orders can be placed. Pt aware.

## 2022-01-07 ENCOUNTER — Other Ambulatory Visit: Payer: Self-pay | Admitting: Hematology

## 2022-01-07 DIAGNOSIS — M545 Low back pain, unspecified: Secondary | ICD-10-CM

## 2022-01-07 DIAGNOSIS — C9000 Multiple myeloma not having achieved remission: Secondary | ICD-10-CM

## 2022-01-12 ENCOUNTER — Other Ambulatory Visit: Payer: Self-pay | Admitting: Hematology

## 2022-01-12 DIAGNOSIS — C9000 Multiple myeloma not having achieved remission: Secondary | ICD-10-CM

## 2022-01-13 ENCOUNTER — Ambulatory Visit (HOSPITAL_COMMUNITY)
Admission: RE | Admit: 2022-01-13 | Discharge: 2022-01-13 | Disposition: A | Discharge status: Home / Self Care | Payer: Medicare HMO | Source: Ambulatory Visit | Attending: Hematology | Admitting: Hematology

## 2022-01-13 DIAGNOSIS — M545 Low back pain, unspecified: Secondary | ICD-10-CM

## 2022-01-13 DIAGNOSIS — M5127 Other intervertebral disc displacement, lumbosacral region: Secondary | ICD-10-CM | POA: Diagnosis not present

## 2022-01-13 DIAGNOSIS — C9000 Multiple myeloma not having achieved remission: Secondary | ICD-10-CM | POA: Diagnosis not present

## 2022-01-13 DIAGNOSIS — M5126 Other intervertebral disc displacement, lumbar region: Secondary | ICD-10-CM | POA: Diagnosis not present

## 2022-01-13 DIAGNOSIS — M4319 Spondylolisthesis, multiple sites in spine: Secondary | ICD-10-CM | POA: Diagnosis not present

## 2022-01-13 DIAGNOSIS — M5136 Other intervertebral disc degeneration, lumbar region: Secondary | ICD-10-CM | POA: Diagnosis not present

## 2022-01-13 MED ORDER — GADOBUTROL 1 MMOL/ML IV SOLN
8.0000 mL | Freq: Once | INTRAVENOUS | Status: AC | PRN
  Administered 2022-01-13: 8 mL via INTRAVENOUS

## 2022-01-14 ENCOUNTER — Other Ambulatory Visit: Payer: Self-pay

## 2022-01-14 DIAGNOSIS — C9000 Multiple myeloma not having achieved remission: Secondary | ICD-10-CM

## 2022-01-15 ENCOUNTER — Inpatient Hospital Stay: Payer: Medicare HMO | Admitting: Hematology

## 2022-01-15 ENCOUNTER — Inpatient Hospital Stay: Payer: Medicare HMO

## 2022-01-15 ENCOUNTER — Other Ambulatory Visit: Payer: Self-pay

## 2022-01-15 ENCOUNTER — Inpatient Hospital Stay: Payer: Medicare HMO | Attending: Hematology

## 2022-01-15 VITALS — BP 133/64 | HR 61 | Temp 97.3°F | Resp 20 | Wt 188.0 lb

## 2022-01-15 DIAGNOSIS — C9000 Multiple myeloma not having achieved remission: Secondary | ICD-10-CM | POA: Insufficient documentation

## 2022-01-15 LAB — CBC WITH DIFFERENTIAL (CANCER CENTER ONLY)
Abs Immature Granulocytes: 0 10*3/uL (ref 0.00–0.07)
Basophils Absolute: 0 10*3/uL (ref 0.0–0.1)
Basophils Relative: 1 %
Eosinophils Absolute: 0.1 10*3/uL (ref 0.0–0.5)
Eosinophils Relative: 5 %
HCT: 33.4 % — ABNORMAL LOW (ref 39.0–52.0)
Hemoglobin: 11.4 g/dL — ABNORMAL LOW (ref 13.0–17.0)
Immature Granulocytes: 0 %
Lymphocytes Relative: 42 %
Lymphs Abs: 1 10*3/uL (ref 0.7–4.0)
MCH: 29.8 pg (ref 26.0–34.0)
MCHC: 34.1 g/dL (ref 30.0–36.0)
MCV: 87.2 fL (ref 80.0–100.0)
Monocytes Absolute: 0.2 10*3/uL (ref 0.1–1.0)
Monocytes Relative: 7 %
Neutro Abs: 1.1 10*3/uL — ABNORMAL LOW (ref 1.7–7.7)
Neutrophils Relative %: 45 %
Platelet Count: 89 10*3/uL — ABNORMAL LOW (ref 150–400)
RBC: 3.83 MIL/uL — ABNORMAL LOW (ref 4.22–5.81)
RDW: 14.6 % (ref 11.5–15.5)
WBC Count: 2.4 10*3/uL — ABNORMAL LOW (ref 4.0–10.5)
nRBC: 0 % (ref 0.0–0.2)

## 2022-01-15 LAB — CMP (CANCER CENTER ONLY)
ALT: 26 U/L (ref 0–44)
AST: 26 U/L (ref 15–41)
Albumin: 3.9 g/dL (ref 3.5–5.0)
Alkaline Phosphatase: 60 U/L (ref 38–126)
Anion gap: 5 (ref 5–15)
BUN: 10 mg/dL (ref 8–23)
CO2: 28 mmol/L (ref 22–32)
Calcium: 8.9 mg/dL (ref 8.9–10.3)
Chloride: 105 mmol/L (ref 98–111)
Creatinine: 1.36 mg/dL — ABNORMAL HIGH (ref 0.61–1.24)
GFR, Estimated: 54 mL/min — ABNORMAL LOW (ref >60)
Glucose, Bld: 106 mg/dL — ABNORMAL HIGH (ref 70–99)
Potassium: 3.8 mmol/L (ref 3.5–5.1)
Sodium: 138 mmol/L (ref 135–145)
Total Bilirubin: 0.8 mg/dL (ref 0.3–1.2)
Total Protein: 6.9 g/dL (ref 6.5–8.1)

## 2022-01-15 MED ORDER — SODIUM CHLORIDE 0.9 % IV SOLN: INTRAVENOUS | Status: DC

## 2022-01-15 MED ORDER — ZOLEDRONIC ACID 4 MG/100ML IV SOLN
4.0000 mg | Freq: Once | INTRAVENOUS | Status: AC
  Administered 2022-01-15: 4 mg via INTRAVENOUS
  Filled 2022-01-15: qty 100

## 2022-01-15 NOTE — Patient Instructions (Signed)

## 2022-01-16 LAB — KAPPA/LAMBDA LIGHT CHAINS
Kappa free light chain: 47.5 mg/L — ABNORMAL HIGH (ref 3.3–19.4)
Kappa, lambda light chain ratio: 1.44 (ref 0.26–1.65)
Lambda free light chains: 33 mg/L — ABNORMAL HIGH (ref 5.7–26.3)

## 2022-01-17 ENCOUNTER — Telehealth: Payer: Self-pay | Admitting: Hematology

## 2022-01-17 ENCOUNTER — Encounter: Payer: Self-pay | Admitting: Hematology

## 2022-01-17 ENCOUNTER — Other Ambulatory Visit: Payer: Self-pay

## 2022-01-17 NOTE — Telephone Encounter (Signed)
Scheduled follow-up appointments per 6/20 los. Patient is aware.

## 2022-01-21 LAB — MULTIPLE MYELOMA PANEL, SERUM
Albumin SerPl Elph-Mcnc: 3.5 g/dL (ref 2.9–4.4)
Albumin/Glob SerPl: 1.3 (ref 0.7–1.7)
Alpha 1: 0.2 g/dL (ref 0.0–0.4)
Alpha2 Glob SerPl Elph-Mcnc: 0.6 g/dL (ref 0.4–1.0)
B-Globulin SerPl Elph-Mcnc: 0.8 g/dL (ref 0.7–1.3)
Gamma Glob SerPl Elph-Mcnc: 1 g/dL (ref 0.4–1.8)
Globulin, Total: 2.7 g/dL (ref 2.2–3.9)
IgA: 322 mg/dL (ref 61–437)
IgG (Immunoglobin G), Serum: 1090 mg/dL (ref 603–1613)
IgM (Immunoglobulin M), Srm: 34 mg/dL (ref 15–143)
Total Protein ELP: 6.2 g/dL (ref 6.0–8.5)

## 2022-01-22 ENCOUNTER — Encounter: Payer: Self-pay | Admitting: Hematology

## 2022-01-22 ENCOUNTER — Other Ambulatory Visit: Payer: Self-pay

## 2022-01-22 DIAGNOSIS — C9000 Multiple myeloma not having achieved remission: Secondary | ICD-10-CM

## 2022-01-22 NOTE — Progress Notes (Signed)
HEMATOLOGY/ONCOLOGY CLINIC NOTE  DOS.01/15/2022   Patient Care Team: [name], MD as PCP - General (Internal Medicine)  CHIEF COMPLAINTS/PURPOSE OF VISIT:  Follow-up for continued evaluation and management of multiple myeloma  HISTORY OF PRESENTING ILLNESS:  Please see previous notes for details on initial presentation.  INTERVAL HISTORY  [name] is a 76 y.o. male for continued valuation and management of multiple myeloma . Patient notes no acute new symptoms since his last clinic visit. No fevers no chills no night sweats no new bone pains no unexpected weight loss. Patient notes grade 1 diarrhea from his Revlimid but no other acute new symptoms. Labs done today were reviewed in detail with the patient.  MEDICAL HISTORY:  Past Medical History:  Diagnosis Date   BPH (benign prostatic hyperplasia) 2004   Constipation    Other pancytopenia (HCC) 05/04/2013   Thrombocytopenia, unspecified (HCC) 08/17/2013   Weight loss 05/04/2013    SURGICAL HISTORY: Past Surgical History:  Procedure Laterality Date   APPENDECTOMY     COLONOSCOPY WITH PROPOFOL N/A 07/17/2015   Procedure: COLONOSCOPY WITH PROPOFOL;  Surgeon: Charolett Bumpers, MD;  Location: [facility];  Service: Endoscopy;  Laterality: N/A;   CYSTOSCOPY WITH FULGERATION N/A 11/08/2020   Procedure: CYSTOSCOPY WITH FULGERATION OF BLEEDERS;  Surgeon: Bjorn Pippin, MD;  Location: [facility];  Service: Urology;  Laterality: N/A;   CYSTOSCOPY WITH FULGERATION N/A 12/01/2020   Procedure: CYSTOSCOPY WITH FULGERATION OF  PROSTATE;  Surgeon: Bjorn Pippin, MD;  Location: [facility];  Service: Urology;  Laterality: N/A;   CYSTOSCOPY WITH INSERTION OF UROLIFT N/A 08/22/2015   Procedure: CYSTOSCOPY WITH INSERTION OF UROLIFT;  Surgeon: Bjorn Pippin, MD;  Location: [facility];  Service: Urology;  Laterality: N/A;   CYSTOSCOPY WITH LITHOLAPAXY N/A 10/24/2020   Procedure: CYSTOSCOPY WITH LITHOLAPAXY;  Surgeon: Bjorn Pippin, MD;  Location: [facility];  Service:  Urology;  Laterality: N/A;  REQUESTING 1 HR FOR CASE   EYE SURGERY     right cataract surgery with lens implant   TESTICLE SURGERY Right 2011   hydrocele   TRANSURETHRAL RESECTION OF PROSTATE N/A 10/24/2020   Procedure: TRANSURETHRAL RESECTION OF THE PROSTATE (TURP);  Surgeon: Bjorn Pippin, MD;  Location: [facility];  Service: Urology;  Laterality: N/A;    SOCIAL HISTORY: Social History   Socioeconomic History   Marital status: Married    Spouse name: Not on file   Number of children: Not on file   Years of education: Not on file   Highest education level: Not on file  Occupational History   Not on file  Tobacco Use   Smoking status: Former    Packs/day: 1.50    Years: 30.00    Total pack years: 45.00    Types: Cigarettes    Quit date: 07/30/1995    Years since quitting: 26.5   Smokeless tobacco: Never  Vaping Use   Vaping Use: Never used  Substance and Sexual Activity   Alcohol use: No   Drug use: No   Sexual activity: Not on file  Other Topics Concern   Not on file  Social History Narrative   Not on file   Social Determinants of Health   Financial Resource Strain: Not on file  Food Insecurity: Not on file  Transportation Needs: Not on file  Physical Activity: Not on file  Stress: Not on file  Social Connections: Not on file  Intimate Partner Violence: Not on file    FAMILY HISTORY: No family history on file.  ALLERGIES:  has No Known Allergies.  MEDICATIONS:  Current Outpatient Medications  Medication Sig Dispense Refill   acetaminophen (TYLENOL) 325 MG tablet Take 650 mg by mouth every 6 (six) hours as needed (for pain.).     b complex vitamins capsule Take 1 capsule by mouth daily. 30 capsule 11   calcium carbonate (TUMS - DOSED IN MG ELEMENTAL CALCIUM) 500 MG chewable tablet Chew 1 tablet by mouth daily.     cholecalciferol (VITAMIN D3) 25 MCG (1000 UNIT) tablet Take 5,000 Units by mouth daily.     doxazosin (CARDURA) 8 MG tablet Take 8 mg by mouth daily in  the afternoon.     finasteride (PROSCAR) 5 MG tablet Take 5 mg by mouth daily in the afternoon.     pravastatin (PRAVACHOL) 40 MG tablet Take 40 mg by mouth daily before supper.     lenalidomide (REVLIMID) 10 MG capsule TAKE 1 CAPSULE BY MOUTH EVERY DAY ON DAYS 1-21 THEN TAKE 7 DAYS OFF 21 capsule 0   No current facility-administered medications for this visit.    REVIEW OF SYSTEMS:   10 Point review of Systems was done is negative except as noted above.  PHYSICAL EXAMINATION: ECOG PERFORMANCE STATUS: 1 - Symptomatic but completely ambulatory  Vitals:   01/15/22 1400  BP: 133/64  Pulse: 61  Resp: 20  Temp: (!) 97.3 F (36.3 C)  SpO2: 100%    Filed Weights   01/15/22 1400  Weight: 188 lb (85.3 kg)  NAD GENERAL:alert, in no acute distress and comfortable SKIN: no acute rashes, no significant lesions EYES: conjunctiva are pink and non-injected, sclera anicteric OROPHARYNX: MMM, no exudates, no oropharyngeal erythema or ulceration NECK: supple, no JVD LYMPH:  no palpable lymphadenopathy in the cervical, axillary or inguinal regions LUNGS: clear to auscultation b/l with normal respiratory effort HEART: regular rate & rhythm ABDOMEN:  normoactive bowel sounds , non tender, not distended. Extremity: no pedal edema PSYCH: alert & oriented x 3 with fluent speech NEURO: no focal motor/sensory deficits   LABORATORY DATA:      Latest Ref Rng & Units 01/15/2022    1:36 PM 12/18/2021    2:26 PM 11/20/2021    9:57 AM  CBC  WBC 4.0 - 10.5 K/uL 2.4  2.3  1.9   Hemoglobin 13.0 - 17.0 g/dL 45.4  09.8  11.9   Hematocrit 39.0 - 52.0 % 33.4  32.7  33.5   Platelets 150 - 400 K/uL 89  80  78   ANC of 1100     Latest Ref Rng & Units 01/15/2022    1:36 PM 12/18/2021    2:26 PM 11/20/2021    9:57 AM  CMP  Glucose 70 - 99 mg/dL 147  829  94   BUN 8 - 23 mg/dL 10  12  8    Creatinine 0.61 - 1.24 mg/dL 5.62  1.30  8.65   Sodium 135 - 145 mmol/L 138  138  139   Potassium 3.5 - 5.1 mmol/L  3.8  3.8  4.0   Chloride 98 - 111 mmol/L 105  106  107   CO2 22 - 32 mmol/L 28  26  26    Calcium 8.9 - 10.3 mg/dL 8.9  9.0  8.9   Total Protein 6.5 - 8.1 g/dL 6.9  6.7  6.9   Total Bilirubin 0.3 - 1.2 mg/dL 0.8  1.1  1.1   Alkaline Phos 38 - 126 U/L 60  51  53   AST  15 - 41 U/L 26  20  20    ALT 0 - 44 U/L 26  18  16      Myeloma labs on 09/26/2021 within normal limits  ,not observed M protein   Component     Latest Ref Rng & Units 08/28/2021  IgG (Immunoglobin G), Serum     603 - 1,613 mg/dL 638  IgA     61 - 756 mg/dL 433  IgM (Immunoglobulin M), Srm     15 - 143 mg/dL 28  Total Protein ELP     6.0 - 8.5 g/dL 6.5  Albumin SerPl Elph-Mcnc     2.9 - 4.4 g/dL 3.8  Alpha 1     0.0 - 0.4 g/dL 0.2  Alpha2 Glob SerPl Elph-Mcnc     0.4 - 1.0 g/dL 0.7  B-Globulin SerPl Elph-Mcnc     0.7 - 1.3 g/dL 0.8  Gamma Glob SerPl Elph-Mcnc     0.4 - 1.8 g/dL 0.9  M Protein SerPl Elph-Mcnc     Not Observed g/dL Not Observed  Globulin, Total     2.2 - 3.9 g/dL 2.7  Albumin/Glob SerPl     0.7 - 1.7 1.5  IFE 1      Comment  Please Note (HCV):      Comment  Kappa free light chain     3.3 - 19.4 mg/L 39.2 (H)  Lambda free light chains     5.7 - 26.3 mg/L 28.7 (H)  Kappa, lambda light chain ratio     0.26 - 1.65 1.37   05/18/2021: PET SCAN Interval resolution of the hypermetabolism seen previously in the posterior left iliac bone. 2. Tiny hypermetabolic focus in the region of the left adrenal gland without nodule or mass lesion evident on CT imaging. Close follow-up recommended. 3. Otherwise no new or suspicious hypermetabolism on today's study. 4.  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 Prostatomegaly  06/01/2021: Bone Marrow Biopsy: Surgical Pathology  CASE: [id number]  PATIENT: Brian Levine  Bone Marrow Report      Clinical History: History of multiple myeloma , (BH)   DIAGNOSIS:   BONE MARROW, ASPIRATE, CLOT, CORE:  -Hypercellular bone marrow for age with trilineage  hematopoiesis and 1%  plasma cells  -See comment   PERIPHERAL BLOOD:  -Pancytopenia   COMMENT:   The bone marrow is hypercellular with trilineage hematopoiesis and nonspecific myeloid changes likely related to therapy.  In this  background, the plasma cells represent 1% of all cells in the aspirate associated with scattering of interstitial cells in the clot and biopsy sections with lack of large aggregates or sheets. The plasma cells display polyclonal staining pattern for kappa and lambda light chains and hence, there is no definitive or diagnostic evidence of residual plasma cell neoplasm.  Correlation with cytogenetic and FISH studies is recommended.   CLOT AND BIOPSY: The sections show cellularity ranging from 20 to 70%  with a mixture of myeloid cell types.  Large clusters or sheets of plasma cells are not identified.  Significant lymphoid aggregates are  not seen.  Immunohistochemical stain for CD138 and in situ hybridization  for kappa and lambda were performed on blocks B1 and C1 with appropriate  controls.  CD138 highlights an extremely minor plasma cell component mostly consisting of scattering of interstitial cells and displays  polyclonal staining pattern for kappa and lambda light chains.  Bone Marrow count performed on 500 cells shows:  Blasts:   0%   Myeloid:  39%  Promyelocytes: 0%  Erythroid:     52%  Myelocytes:    6%   Lymphocytes:   6%  Metamyelocytes:     3%   Plasma cells:  1%  Bands:    11%  Neutrophils:   14%  M:E ratio:     0.75  Eosinophils:   5%  Basophils:     0%  Monocytes:     2%   Lab Data: CBC performed on 06/01/21 shows:  WBC: 2.2 k/uL  Neutrophils:   56%  Hgb: 10.4 g/dL Lymphocytes:   40%  HCT: 31.0 %    Monocytes:     12%  MCV: 94.8 fL   Eosinophils:   4%  RDW: 14.2 %    Basophils:     2%  PLT: 135 k/uL     ASSESSMENT & PLAN:   76 yo with    1) Recurrent/persistent hematuria bleeding after prostate surgery. - resolved No fhx or  previous personal h/o bleeding issues.  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 Multiple myeloma panel shows no M spike. Serum kappa lambda free light chain shows normal ratio Patient has no overt evidence of progression of multiple myeloma at this time. Note significant toxicities from Revlimid other than some cytopenias grade 1 fatigue and grade 1 diarrhea. I discussed recommendations of close reduction of the patient's Revlimid to 5 mg but the patient prefers to continue Revlimid at 10 mg at this time with close monitoring. -Continue vitamin D 5000 units daily -Continue aspirin 81 mg p.o. daily -Continue Zometa every 4 weeks -RTC in 8 weeks  5) lower extremity tingling numbness and intermittent purple discoloration rule out peripheral arterial disease -We will get ultrasound with ABIs to evaluate for peripheral arterial disease We will refer to neurology to rule out radiculopathy due to tingling and numbness and burning discomfort  FOLLOW UP:  US arterial with ABI in 1 week Referral to neurology for evaluation of radiculopathy vs neuropathy Please schedule next 3 doses of Zometa every 4 weeks with labs MD visit in 8 weeks.   The total time spent in the appointment was 30 minutes*.  All of the patient's questions were answered with apparent satisfaction. The  patient knows to call the clinic with any problems, questions or concerns.   Wyvonnia Lora MD MS AAHIVMS [facility] Hematology/Oncology Physician [facility]  .*Total Encounter Time as defined by the Centers for Medicare and Medicaid Services includes, in addition to the face-to-face time of a patient visit (documented in the note above) non-face-to-face time: obtaining and reviewing outside history, ordering and reviewing medications, tests or procedures, care coordination (communications with other health care professionals or caregivers) and documentation in the medical record.

## 2022-01-24 ENCOUNTER — Other Ambulatory Visit: Payer: Self-pay

## 2022-01-24 ENCOUNTER — Ambulatory Visit (HOSPITAL_COMMUNITY)
Admission: RE | Admit: 2022-01-24 | Discharge: 2022-01-24 | Disposition: A | Discharge status: Home / Self Care | Payer: Medicare HMO | Source: Ambulatory Visit | Attending: Hematology | Admitting: Hematology

## 2022-01-24 ENCOUNTER — Encounter (HOSPITAL_COMMUNITY): Payer: Self-pay

## 2022-01-24 DIAGNOSIS — C9000 Multiple myeloma not having achieved remission: Secondary | ICD-10-CM

## 2022-02-11 ENCOUNTER — Encounter: Payer: Self-pay | Admitting: Hematology

## 2022-02-11 NOTE — Progress Notes (Signed)
Pt was approved w/ the [employer] for $12,000 from 01/06/22 through 01/06/23 for Zometa and Revlimid.

## 2022-02-12 ENCOUNTER — Inpatient Hospital Stay: Payer: Medicare HMO

## 2022-02-12 ENCOUNTER — Inpatient Hospital Stay: Payer: Medicare HMO | Attending: Hematology

## 2022-02-12 ENCOUNTER — Other Ambulatory Visit: Payer: Self-pay

## 2022-02-12 VITALS — BP 127/60 | HR 97 | Temp 98.4°F | Resp 18

## 2022-02-12 DIAGNOSIS — C9000 Multiple myeloma not having achieved remission: Secondary | ICD-10-CM

## 2022-02-12 LAB — CBC WITH DIFFERENTIAL/PLATELET
Abs Immature Granulocytes: 0 10*3/uL (ref 0.00–0.07)
Basophils Absolute: 0 10*3/uL (ref 0.0–0.1)
Basophils Relative: 1 %
Eosinophils Absolute: 0.1 10*3/uL (ref 0.0–0.5)
Eosinophils Relative: 5 %
HCT: 31.6 % — ABNORMAL LOW (ref 39.0–52.0)
Hemoglobin: 11 g/dL — ABNORMAL LOW (ref 13.0–17.0)
Immature Granulocytes: 0 %
Lymphocytes Relative: 41 %
Lymphs Abs: 1 10*3/uL (ref 0.7–4.0)
MCH: 29.5 pg (ref 26.0–34.0)
MCHC: 34.8 g/dL (ref 30.0–36.0)
MCV: 84.7 fL (ref 80.0–100.0)
Monocytes Absolute: 0.2 10*3/uL (ref 0.1–1.0)
Monocytes Relative: 7 %
Neutro Abs: 1.1 10*3/uL — ABNORMAL LOW (ref 1.7–7.7)
Neutrophils Relative %: 46 %
Platelets: 94 10*3/uL — ABNORMAL LOW (ref 150–400)
RBC: 3.73 MIL/uL — ABNORMAL LOW (ref 4.22–5.81)
RDW: 14.5 % (ref 11.5–15.5)
WBC: 2.4 10*3/uL — ABNORMAL LOW (ref 4.0–10.5)
nRBC: 0 % (ref 0.0–0.2)

## 2022-02-12 LAB — CMP (CANCER CENTER ONLY)
ALT: 31 U/L (ref 0–44)
AST: 69 U/L — ABNORMAL HIGH (ref 15–41)
Albumin: 3.9 g/dL (ref 3.5–5.0)
Alkaline Phosphatase: 58 U/L (ref 38–126)
Anion gap: 8 (ref 5–15)
BUN: 9 mg/dL (ref 8–23)
CO2: 25 mmol/L (ref 22–32)
Calcium: 8.8 mg/dL — ABNORMAL LOW (ref 8.9–10.3)
Chloride: 105 mmol/L (ref 98–111)
Creatinine: 1.32 mg/dL — ABNORMAL HIGH (ref 0.61–1.24)
GFR, Estimated: 56 mL/min — ABNORMAL LOW (ref >60)
Glucose, Bld: 125 mg/dL — ABNORMAL HIGH (ref 70–99)
Potassium: 3.6 mmol/L (ref 3.5–5.1)
Sodium: 138 mmol/L (ref 135–145)
Total Bilirubin: 1.2 mg/dL (ref 0.3–1.2)
Total Protein: 6.6 g/dL (ref 6.5–8.1)

## 2022-02-12 MED ORDER — SODIUM CHLORIDE 0.9 % IV SOLN: Freq: Once | INTRAVENOUS | Status: AC

## 2022-02-12 MED ORDER — ZOLEDRONIC ACID 4 MG/100ML IV SOLN
4.0000 mg | Freq: Once | INTRAVENOUS | Status: AC
  Administered 2022-02-12: 4 mg via INTRAVENOUS
  Filled 2022-02-12: qty 100

## 2022-02-12 NOTE — Patient Instructions (Signed)

## 2022-02-12 NOTE — Progress Notes (Signed)
Pt is ok for Zometa tx today with corrected CA of 8.8 per Dede Query PAC.

## 2022-02-16 ENCOUNTER — Other Ambulatory Visit: Payer: Self-pay | Admitting: Hematology

## 2022-02-16 DIAGNOSIS — C9000 Multiple myeloma not having achieved remission: Secondary | ICD-10-CM

## 2022-02-20 ENCOUNTER — Other Ambulatory Visit: Payer: Self-pay

## 2022-02-20 ENCOUNTER — Encounter: Payer: Self-pay | Admitting: Hematology

## 2022-03-08 ENCOUNTER — Other Ambulatory Visit: Payer: Self-pay | Admitting: *Deleted

## 2022-03-08 DIAGNOSIS — C9000 Multiple myeloma not having achieved remission: Secondary | ICD-10-CM

## 2022-03-12 ENCOUNTER — Inpatient Hospital Stay: Payer: Medicare HMO | Admitting: Hematology

## 2022-03-12 ENCOUNTER — Other Ambulatory Visit: Payer: Self-pay

## 2022-03-12 ENCOUNTER — Inpatient Hospital Stay: Payer: Medicare HMO

## 2022-03-12 ENCOUNTER — Other Ambulatory Visit: Payer: Self-pay | Admitting: Hematology

## 2022-03-12 ENCOUNTER — Inpatient Hospital Stay: Payer: Medicare HMO | Attending: Hematology

## 2022-03-12 VITALS — BP 128/72 | HR 50 | Resp 16

## 2022-03-12 DIAGNOSIS — D709 Neutropenia, unspecified: Secondary | ICD-10-CM | POA: Diagnosis not present

## 2022-03-12 DIAGNOSIS — C9000 Multiple myeloma not having achieved remission: Secondary | ICD-10-CM | POA: Diagnosis not present

## 2022-03-12 DIAGNOSIS — N182 Chronic kidney disease, stage 2 (mild): Secondary | ICD-10-CM | POA: Insufficient documentation

## 2022-03-12 DIAGNOSIS — Z79899 Other long term (current) drug therapy: Secondary | ICD-10-CM | POA: Diagnosis not present

## 2022-03-12 DIAGNOSIS — Z7982 Long term (current) use of aspirin: Secondary | ICD-10-CM | POA: Insufficient documentation

## 2022-03-12 DIAGNOSIS — Z87891 Personal history of nicotine dependence: Secondary | ICD-10-CM | POA: Diagnosis not present

## 2022-03-12 LAB — CMP (CANCER CENTER ONLY)
ALT: 22 U/L (ref 0–44)
AST: 23 U/L (ref 15–41)
Albumin: 3.8 g/dL (ref 3.5–5.0)
Alkaline Phosphatase: 53 U/L (ref 38–126)
Anion gap: 7 (ref 5–15)
BUN: 11 mg/dL (ref 8–23)
CO2: 23 mmol/L (ref 22–32)
Calcium: 9.6 mg/dL (ref 8.9–10.3)
Chloride: 109 mmol/L (ref 98–111)
Creatinine: 1.55 mg/dL — ABNORMAL HIGH (ref 0.61–1.24)
GFR, Estimated: 46 mL/min — ABNORMAL LOW (ref >60)
Glucose, Bld: 99 mg/dL (ref 70–99)
Potassium: 3.7 mmol/L (ref 3.5–5.1)
Sodium: 139 mmol/L (ref 135–145)
Total Bilirubin: 1.3 mg/dL — ABNORMAL HIGH (ref 0.3–1.2)
Total Protein: 7 g/dL (ref 6.5–8.1)

## 2022-03-12 LAB — CBC WITH DIFFERENTIAL/PLATELET
Abs Immature Granulocytes: 0 10*3/uL (ref 0.00–0.07)
Basophils Absolute: 0 10*3/uL (ref 0.0–0.1)
Basophils Relative: 1 %
Eosinophils Absolute: 0.1 10*3/uL (ref 0.0–0.5)
Eosinophils Relative: 6 %
HCT: 32.8 % — ABNORMAL LOW (ref 39.0–52.0)
Hemoglobin: 11.5 g/dL — ABNORMAL LOW (ref 13.0–17.0)
Immature Granulocytes: 0 %
Lymphocytes Relative: 45 %
Lymphs Abs: 0.9 10*3/uL (ref 0.7–4.0)
MCH: 29.9 pg (ref 26.0–34.0)
MCHC: 35.1 g/dL (ref 30.0–36.0)
MCV: 85.2 fL (ref 80.0–100.0)
Monocytes Absolute: 0.2 10*3/uL (ref 0.1–1.0)
Monocytes Relative: 7 %
Neutro Abs: 0.8 10*3/uL — ABNORMAL LOW (ref 1.7–7.7)
Neutrophils Relative %: 41 %
Platelets: 90 10*3/uL — ABNORMAL LOW (ref 150–400)
RBC: 3.85 MIL/uL — ABNORMAL LOW (ref 4.22–5.81)
RDW: 14.8 % (ref 11.5–15.5)
WBC: 2.1 10*3/uL — ABNORMAL LOW (ref 4.0–10.5)
nRBC: 0 % (ref 0.0–0.2)

## 2022-03-12 MED ORDER — LENALIDOMIDE 5 MG PO CAPS: 5.0000 mg | ORAL_CAPSULE | Freq: Every day | ORAL | 0 refills | Status: DC

## 2022-03-12 MED ORDER — ZOLEDRONIC ACID 4 MG/100ML IV SOLN
4.0000 mg | Freq: Once | INTRAVENOUS | Status: AC
  Administered 2022-03-12: 4 mg via INTRAVENOUS

## 2022-03-12 MED ORDER — SODIUM CHLORIDE 0.9 % IV SOLN: Freq: Once | INTRAVENOUS | Status: AC

## 2022-03-12 MED ORDER — ASPIRIN 81 MG PO TBEC: 81.0000 mg | DELAYED_RELEASE_TABLET | Freq: Every day | ORAL | 12 refills | Status: AC

## 2022-03-12 NOTE — Patient Instructions (Signed)

## 2022-03-13 LAB — KAPPA/LAMBDA LIGHT CHAINS
Kappa free light chain: 48.4 mg/L — ABNORMAL HIGH (ref 3.3–19.4)
Kappa, lambda light chain ratio: 1.24 (ref 0.26–1.65)
Lambda free light chains: 39 mg/L — ABNORMAL HIGH (ref 5.7–26.3)

## 2022-03-14 ENCOUNTER — Telehealth: Payer: Self-pay | Admitting: Hematology

## 2022-03-14 NOTE — Telephone Encounter (Signed)
Scheduled follow-up appointment per 8/15 los. Patient is aware.

## 2022-03-15 LAB — MULTIPLE MYELOMA PANEL, SERUM
Albumin SerPl Elph-Mcnc: 3.5 g/dL (ref 2.9–4.4)
Albumin/Glob SerPl: 1.3 (ref 0.7–1.7)
Alpha 1: 0.2 g/dL (ref 0.0–0.4)
Alpha2 Glob SerPl Elph-Mcnc: 0.7 g/dL (ref 0.4–1.0)
B-Globulin SerPl Elph-Mcnc: 0.9 g/dL (ref 0.7–1.3)
Gamma Glob SerPl Elph-Mcnc: 1.1 g/dL (ref 0.4–1.8)
Globulin, Total: 2.8 g/dL (ref 2.2–3.9)
IgA: 341 mg/dL (ref 61–437)
IgG (Immunoglobin G), Serum: 1095 mg/dL (ref 603–1613)
IgM (Immunoglobulin M), Srm: 26 mg/dL (ref 15–143)
Total Protein ELP: 6.3 g/dL (ref 6.0–8.5)

## 2022-03-18 ENCOUNTER — Other Ambulatory Visit: Payer: Self-pay | Admitting: Hematology

## 2022-03-18 DIAGNOSIS — C9000 Multiple myeloma not having achieved remission: Secondary | ICD-10-CM

## 2022-03-19 ENCOUNTER — Encounter: Payer: Self-pay | Admitting: Hematology

## 2022-03-19 NOTE — Progress Notes (Signed)
HEMATOLOGY/ONCOLOGY CLINIC NOTE  DOS.03/12/2022   Patient Care Team: Lavone Orn, MD as PCP - General (Internal Medicine)  CHIEF COMPLAINTS/PURPOSE OF VISIT:  Follow-up for continuation and management of multiple myeloma  HISTORY OF PRESENTING ILLNESS:  Please see previous notes for details on initial presentation.  INTERVAL HISTORY  Brian Levine is a 76 y.o. male for continued evaluation and management of multiple myeloma.  He notes no acute new symptoms since her last clinic visit. No new focal bone pains.  No new urinary symptoms.  No bleeding issues.  No new fatigue. Tolerating current dose of Revlimid well but with concerns for significant cytopenias and some grade 1-2 fatigue.  We discussed cutting down the Revlimid maintenance dose over the next cycle down to 5 mg. Labs done today were reviewed with the patient in detail.  MEDICAL HISTORY:  Past Medical History:  Diagnosis Date   BPH (benign prostatic hyperplasia) 2004   Constipation    Other pancytopenia ([place]) 05/04/2013   Thrombocytopenia, unspecified ([place]) 08/17/2013   Weight loss 05/04/2013    SURGICAL HISTORY: Past Surgical History:  Procedure Laterality Date   APPENDECTOMY     COLONOSCOPY WITH PROPOFOL N/A 07/17/2015   Procedure: COLONOSCOPY WITH PROPOFOL;  Surgeon: Garlan Fair, MD;  [facility];  Service: Endoscopy;  Laterality: N/A;   CYSTOSCOPY WITH FULGERATION N/A 11/08/2020   Procedure: CYSTOSCOPY WITH FULGERATION OF BLEEDERS;  Surgeon: Irine Seal, MD;  [facility];  Service: Urology;  Laterality: N/A;   CYSTOSCOPY WITH FULGERATION N/A 12/01/2020   Procedure: CYSTOSCOPY WITH FULGERATION OF  PROSTATE;  Surgeon: Irine Seal, MD;  [facility];  Service: Urology;  Laterality: N/A;   CYSTOSCOPY WITH INSERTION OF UROLIFT N/A 08/22/2015   Procedure: CYSTOSCOPY WITH INSERTION OF UROLIFT;  Surgeon: Irine Seal, MD;  [facility];  Service: Urology;  Laterality: N/A;   CYSTOSCOPY WITH  LITHOLAPAXY N/A 10/24/2020   Procedure: CYSTOSCOPY WITH LITHOLAPAXY;  Surgeon: Irine Seal, MD;  [facility];  Service: Urology;  Laterality: N/A;  REQUESTING 1 HR FOR CASE   EYE SURGERY     right cataract surgery with lens implant   TESTICLE SURGERY Right 2011   hydrocele   TRANSURETHRAL RESECTION OF PROSTATE N/A 10/24/2020   Procedure: TRANSURETHRAL RESECTION OF THE PROSTATE (TURP);  Surgeon: Irine Seal, MD;  [facility];  Service: Urology;  Laterality: N/A;    SOCIAL HISTORY: Social History   Socioeconomic History   Marital status: Married    Spouse name: Not on file   Number of children: Not on file   Years of education: Not on file   Highest education level: Not on file  Occupational History   Not on file  Tobacco Use   Smoking status: Former    Packs/day: 1.50    Years: 30.00    Total pack years: 45.00    Types: Cigarettes    Quit date: 07/30/1995    Years since quitting: 26.6   Smokeless tobacco: Never  Vaping Use   Vaping Use: Never used  Substance and Sexual Activity   Alcohol use: No   Drug use: No   Sexual activity: Not on file  Other Topics Concern   Not on file  Social History Narrative   Not on file   Social Determinants of Health   Financial Resource Strain: Not on file  Food Insecurity: Not on file  Transportation Needs: Not on file  Physical Activity: Not on file  Stress: Not on file  Social  Connections: Not on file  Intimate Partner Violence: Not on file    FAMILY HISTORY: No family history on file.  ALLERGIES:  has No Known Allergies.  MEDICATIONS:  Current Outpatient Medications  Medication Sig Dispense Refill   aspirin EC 81 MG tablet Take 1 tablet (81 mg total) by mouth daily. Swallow whole. 30 tablet 12   acetaminophen (TYLENOL) 325 MG tablet Take 650 mg by mouth every 6 (six) hours as needed (for pain.).     b complex vitamins capsule Take 1 capsule by mouth daily. 30 capsule 11   calcium carbonate (TUMS - DOSED IN MG  ELEMENTAL CALCIUM) 500 MG chewable tablet Chew 1 tablet by mouth daily.     cholecalciferol (VITAMIN D3) 25 MCG (1000 UNIT) tablet Take 5,000 Units by mouth daily.     doxazosin (CARDURA) 8 MG tablet Take 8 mg by mouth daily in the afternoon.     finasteride (PROSCAR) 5 MG tablet Take 5 mg by mouth daily in the afternoon.     lenalidomide (REVLIMID) 5 MG capsule Take 1 capsule (5 mg total) by mouth daily. 21 capsule 0   pravastatin (PRAVACHOL) 40 MG tablet Take 40 mg by mouth daily before supper.     No current facility-administered medications for this visit.    REVIEW OF SYSTEMS:   10 Point review of Systems was done is negative except as noted above.  PHYSICAL EXAMINATION: ECOG PERFORMANCE STATUS: 1 - Symptomatic but completely ambulatory  Vitals:   03/12/22 1006  BP: 139/71  Pulse: (!) 56  Resp: 15  Temp: 98.1 F (36.7 C)  SpO2: 100%    Filed Weights   03/12/22 1006  Weight: 186 lb 9.6 oz (84.6 kg)  NAD NAD GENERAL:alert, in no acute distress and comfortable SKIN: no acute rashes, no significant lesions EYES: conjunctiva are pink and non-injected, sclera anicteric OROPHARYNX: MMM, no exudates, no oropharyngeal erythema or ulceration NECK: supple, no JVD LYMPH:  no palpable lymphadenopathy in the cervical, axillary or inguinal regions LUNGS: clear to auscultation b/l with normal respiratory effort HEART: regular rate & rhythm ABDOMEN:  normoactive bowel sounds , non tender, not distended. Extremity: no pedal edema PSYCH: alert & oriented x 3 with fluent speech NEURO: no focal motor/sensory deficits   LABORATORY DATA:      Latest Ref Rng & Units 03/12/2022    9:45 AM 02/12/2022    2:33 PM 01/15/2022    1:36 PM  CBC  WBC 4.0 - 10.5 K/uL 2.1  2.4  2.4   Hemoglobin 13.0 - 17.0 g/dL 11.5  11.0  11.4   Hematocrit 39.0 - 52.0 % 32.8  31.6  33.4   Platelets 150 - 400 K/uL 90  94  89   ANC of 1100     Latest Ref Rng & Units 03/12/2022    9:45 AM 02/12/2022    2:33  PM 01/15/2022    1:36 PM  CMP  Glucose 70 - 99 mg/dL 99  125  106   BUN 8 - 23 mg/dL _0 Creatinine 0.61 - 1.24 mg/dL 1.55  1.32  1.36   Sodium 135 - 145 mmol/L 139  138  138   Potassium 3.5 - 5.1 mmol/L 3.7  3.6  3.8   Chloride 98 - 111 mmol/L 109  105  105   CO2 22 - 32 mmol/L _1 Calcium 8.9 - 10.3 mg/dL 9.6  8.8  8.9  Total Protein 6.5 - 8.1 g/dL 7.0  6.6  6.9   Total Bilirubin 0.3 - 1.2 mg/dL 1.3  1.2  0.8   Alkaline Phos 38 - 126 U/L 53  58  60   AST 15 - 41 U/L 23  69  26   ALT 0 - 44 U/L _0 Myeloma labs on 09/26/2021 within normal limits  ,not observed M protein   Component     Latest Ref Rng & Units 08/28/2021  IgG (Immunoglobin G), Serum     603 - 1,613 mg/dL 906  IgA     61 - 437 mg/dL 218  IgM (Immunoglobulin M), Srm     15 - 143 mg/dL 28  Total Protein ELP     6.0 - 8.5 g/dL 6.5  Albumin SerPl Elph-Mcnc     2.9 - 4.4 g/dL 3.8  Alpha 1     0.0 - 0.4 g/dL 0.2  Alpha2 Glob SerPl Elph-Mcnc     0.4 - 1.0 g/dL 0.7  B-Globulin SerPl Elph-Mcnc     0.7 - 1.3 g/dL 0.8  Gamma Glob SerPl Elph-Mcnc     0.4 - 1.8 g/dL 0.9  M Protein SerPl Elph-Mcnc     Not Observed g/dL Not Observed  Globulin, Total     2.2 - 3.9 g/dL 2.7  Albumin/Glob SerPl     0.7 - 1.7 1.5  IFE 1      Comment  Please Note (HCV):      Comment  Kappa free light chain     3.3 - 19.4 mg/L 39.2 (H)  Lambda free light chains     5.7 - 26.3 mg/L 28.7 (H)  Kappa, lambda light chain ratio     0.26 - 1.65 1.37   05/18/2021: PET SCAN Interval resolution of the hypermetabolism seen previously in the posterior left iliac bone. 2. Tiny hypermetabolic focus in the region of the left adrenal gland without nodule or mass lesion evident on CT imaging. Close follow-up recommended. 3. Otherwise no new or suspicious hypermetabolism on today's study. 4.  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 Prostatomegaly  06/01/2021: Bone Marrow Biopsy: Surgical Pathology  CASE:  [id number]  PATIENT: Brian Levine  Bone Marrow Report      Clinical History: History of multiple myeloma , (BH)   DIAGNOSIS:   BONE MARROW, ASPIRATE, CLOT, CORE:  -Hypercellular bone marrow for age with trilineage hematopoiesis and 1%  plasma cells  -See comment   PERIPHERAL BLOOD:  -Pancytopenia   COMMENT:   The bone marrow is hypercellular with trilineage hematopoiesis and nonspecific myeloid changes likely related to therapy.  In this  background, the plasma cells represent 1% of all cells in the aspirate associated with scattering of interstitial cells in the clot and biopsy sections with lack of large aggregates or sheets. The plasma cells display polyclonal staining pattern for kappa and lambda light chains and hence, there is no definitive or diagnostic evidence of residual plasma cell neoplasm.  Correlation with cytogenetic and FISH studies is recommended.   CLOT AND BIOPSY: The sections show cellularity ranging from 20 to 70%  with a mixture of myeloid cell types.  Large clusters or sheets of plasma cells are not identified.  Significant lymphoid aggregates are  not seen.  Immunohistochemical stain for CD138 and in situ hybridization  for kappa and lambda were performed on blocks B1 and C1 with appropriate  controls.  CD138 highlights an extremely minor plasma cell component mostly  consisting of scattering of interstitial cells and displays  polyclonal staining pattern for kappa and lambda light chains.  Bone Marrow count performed on 500 cells shows:  Blasts:   0%   Myeloid:  39%  Promyelocytes: 0%   Erythroid:     52%  Myelocytes:    6%   Lymphocytes:   6%  Metamyelocytes:     3%   Plasma cells:  1%  Bands:    11%  Neutrophils:   14%  M:E ratio:     0.75  Eosinophils:   5%  Basophils:     0%  Monocytes:     2%   Lab Data: CBC performed on 06/01/21 shows:  WBC: 2.2 k/uL  Neutrophils:   56%  Hgb: 10.4 g/dL Lymphocytes:   26%  HCT: 31.0 %    Monocytes:     12%   MCV: 94.8 fL   Eosinophils:   4%  RDW: 14.2 %    Basophils:     2%  PLT: 135 k/uL     ASSESSMENT & PLAN:   76 yo with    1) Recurrent/persistent hematuria bleeding after prostate surgery. - resolved No fhx or previous personal h/o bleeding issues.  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 Patient having lower extremity and other muscle cramps.  Ultrasound with ABIs showed no significant arterial obstruction. We discussed cutting down the Revlimid to 5 mg p.o. 3 weeks on 1 week off to address the muscle cramps and pancytopenia. -Patient will also continue vitamin D 5000 units p.o. daily -Continue aspirin 81 mg p.o. daily -Continue Zometa every 8 weeks  5) lower extremity tingling numbness and intermittent purple discoloration rule out peripheral arterial disease Ultrasound with ABIs show no overt vascular obstruction.  We will refer to neurology to rule out radiculopathy due to tingling and numbness and burning discomfort  FOLLOW UP:  Switching Zometa to every 8 weeks. Plz cancel appointment for labs/zometa in 4 weeks RTC with Dr Irene Limbo with labs and appointment for zometa in 8 weeks  The total time spent in the appointment was 25 minutes*.  All of the patient's questions were answered with apparent satisfaction. The patient knows to call the clinic with any problems, questions or concerns.   Sullivan Lone MD MS AAHIVMS [facility] Hematology/Oncology  Physician [facility]  .*Total Encounter Time as defined by the Centers for Medicare and Medicaid Services includes, in addition to the face-to-face time of a patient visit (documented in the note above) non-face-to-face time: obtaining and reviewing outside history, ordering and reviewing medications, tests or procedures, care coordination (communications with other health care professionals or caregivers) and documentation in the medical record.

## 2022-03-21 ENCOUNTER — Other Ambulatory Visit: Payer: Self-pay

## 2022-03-21 ENCOUNTER — Encounter: Payer: Self-pay | Admitting: Hematology

## 2022-03-21 DIAGNOSIS — C9000 Multiple myeloma not having achieved remission: Secondary | ICD-10-CM

## 2022-03-21 MED ORDER — LENALIDOMIDE 5 MG PO CAPS: 5.0000 mg | ORAL_CAPSULE | Freq: Every day | ORAL | 0 refills | Status: DC

## 2022-03-25 ENCOUNTER — Encounter: Payer: Self-pay | Admitting: Neurology

## 2022-03-25 ENCOUNTER — Ambulatory Visit: Payer: Medicare HMO | Admitting: Neurology

## 2022-03-25 VITALS — BP 139/63 | HR 53 | Ht 71.0 in | Wt 185.0 lb

## 2022-03-25 DIAGNOSIS — R202 Paresthesia of skin: Secondary | ICD-10-CM | POA: Diagnosis not present

## 2022-03-25 NOTE — Progress Notes (Signed)
Chief Complaint  Patient presents with   New Patient (Initial Visit)    Rm 12. Alone. NP/Internal referral for neuropathy vs radiculopathy - LE numbness and tingling for the past four months. Denies any difficulty walking.    ASSESSMENT AND PLAN  Brian Levine is a 76 y.o. 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   MEDICAL HISTORY:  Brian Levine is a 76 year old male, seen in request by his oncologist Dr. Irene Limbo, [place], for evaluation of bilateral lower extremity paresthesia, his primary care physician is Dr. Laurann Montana, Jenny Reichmann, initial evaluation was March 25, 2022  I reviewed and summarized the referring note. PMHX. Multiple myeloma. BPH HLD  Patient had a persistent hematuria and bleeding following his prostate surgery in March 2022,  Peripheral blood evaluation showed leukopenia and thrombocytopenia, bone marrow biopsy confirmed the diagnosis of multiple myeloma in July 2022, began to receive Carfilzomib treatment, with good response, now taking Revlimid.  Patient has been active, including now, he works out at [employer] regularly, walking without any difficulties  He does have intermittent low back, neck pain, especially his neck, he also have a short flareup of neck pain radiating to his scalp, shoulder, but not long-lasting, he feels a cracking sound when turning his neck  Around April 2023, he began to notice bottom of his feet numbness, quickly extending to mid shin level, he denies gait abnormality, denies upper extremity involvement, denies bowel and bladder  incontinence Personally reviewed MRI of lumbar spine in June 2023, multilevel degenerative changes, most prominent L5-S1, L4-5, no significant canal stenosis  PHYSICAL EXAM:   Vitals:   03/25/22 1620  BP: 139/63  Pulse: (!) 53  Weight: 185 lb (83.9 kg)  Height: _0  (1.803 m)   Not recorded     Body mass index is 25.8 kg/m.  PHYSICAL EXAMNIATION:  Gen: NAD, conversant, well nourised, well groomed                     Cardiovascular: Regular rate rhythm, no peripheral edema, warm, nontender. Eyes: Conjunctivae clear without exudates or hemorrhage Neck: Supple, no carotid bruits. Pulmonary: Clear to auscultation bilaterally   NEUROLOGICAL EXAM:  MENTAL STATUS: Speech/cognition: Awake, alert, oriented to history taking and casual conversation CRANIAL NERVES: CN II: Visual fields are full to confrontation. Pupils are round equal and briskly reactive to light. CN III, IV, VI: extraocular movement are normal. No ptosis. CN V: Facial sensation is intact to light touch CN VII: Face is symmetric with normal eye closure  CN VIII: Hearing is normal to causal conversation. CN IX, X: Phonation is normal. CN XI: Head turning and shoulder shrug are intact  MOTOR: There is no pronator drift of out-stretched arms. Muscle bulk and tone are normal. Muscle strength is normal.  REFLEXES: Reflexes are 2+ and symmetric at the biceps, triceps, 3/3 knees, and absent at ankles. Plantar responses are flexor.  SENSORY: Decreased vibratory sensation at toes, preserved proprioception, mildly length dependent decreased light touch, pin prick to ankle level  COORDINATION: There is no trunk or limb dysmetria noted.  GAIT/STANCE: Posture is normal. Gait is steady with normal steps, base, arm  swing, and turning. Heel and toe walking are normal. Tandem gait is normal.  Romberg is absent.  REVIEW OF SYSTEMS:  Full 14 system review of systems performed and notable only for as above All other  review of systems were negative.   ALLERGIES: No Known Allergies  HOME MEDICATIONS: Current Outpatient Medications  Medication Sig Dispense Refill   acetaminophen (TYLENOL) 325 MG tablet Take 650 mg by mouth every 6 (six) hours as needed (for pain.).     aspirin EC 81 MG tablet Take 1 tablet (81 mg total) by mouth daily. Swallow whole. 30 tablet 12   b complex vitamins capsule Take 1 capsule by mouth daily. 30 capsule 11   calcium carbonate (TUMS - DOSED IN MG ELEMENTAL CALCIUM) 500 MG chewable tablet Chew 1 tablet by mouth daily.     cholecalciferol (VITAMIN D3) 25 MCG (1000 UNIT) tablet Take 5,000 Units by mouth daily.     doxazosin (CARDURA) 8 MG tablet Take 8 mg by mouth daily in the afternoon.     finasteride (PROSCAR) 5 MG tablet Take 5 mg by mouth daily in the afternoon.     lenalidomide (REVLIMID) 5 MG capsule Take 1 capsule (5 mg total) by mouth daily. 21 capsule 0   pravastatin (PRAVACHOL) 40 MG tablet Take 40 mg by mouth daily before supper.     No current facility-administered medications for this visit.    PAST MEDICAL HISTORY: Past Medical History:  Diagnosis Date   BPH (benign prostatic hyperplasia) 2004   Constipation    Other pancytopenia ([place]) 05/04/2013   Thrombocytopenia, unspecified ([place]) 08/17/2013   Weight loss 05/04/2013    PAST SURGICAL HISTORY: Past Surgical History:  Procedure Laterality Date   APPENDECTOMY     COLONOSCOPY WITH PROPOFOL N/A 07/17/2015   Procedure: COLONOSCOPY WITH PROPOFOL;  Surgeon: Garlan Fair, MD;  [facility];  Service: Endoscopy;  Laterality: N/A;   CYSTOSCOPY WITH FULGERATION N/A 11/08/2020   Procedure: CYSTOSCOPY WITH FULGERATION OF BLEEDERS;  Surgeon: Irine Seal, MD;  [facility];  Service: Urology;  Laterality: N/A;   CYSTOSCOPY WITH FULGERATION N/A 12/01/2020   Procedure: CYSTOSCOPY WITH FULGERATION OF  PROSTATE;  Surgeon: Irine Seal, MD;  [facility];  Service: Urology;  Laterality: N/A;    CYSTOSCOPY WITH INSERTION OF UROLIFT N/A 08/22/2015   Procedure: CYSTOSCOPY WITH INSERTION OF UROLIFT;  Surgeon: Irine Seal, MD;  [facility];  Service: Urology;  Laterality: N/A;   CYSTOSCOPY WITH LITHOLAPAXY N/A 10/24/2020   Procedure: CYSTOSCOPY WITH LITHOLAPAXY;  Surgeon: Irine Seal, MD;  [facility];  Service: Urology;  Laterality: N/A;  REQUESTING 1 HR FOR CASE   EYE SURGERY     right cataract surgery with lens implant   TESTICLE SURGERY Right 2011   hydrocele   TRANSURETHRAL RESECTION OF PROSTATE N/A 10/24/2020   Procedure: TRANSURETHRAL RESECTION OF THE PROSTATE (TURP);  Surgeon: Irine Seal, MD;  [facility];  Service: Urology;  Laterality: N/A;    FAMILY HISTORY: History reviewed. No pertinent family history.  SOCIAL HISTORY: Social History   Socioeconomic History   Marital status: Married    Spouse name: Not on file   Number of children: Not on file   Years of education: Not on file   Highest education level: Not on file  Occupational History   Not on file  Tobacco Use   Smoking status: Former    Packs/day: 1.50    Years: 30.00    Total pack years: 45.00  Types: Cigarettes    Quit date: 07/30/1995    Years since quitting: 26.6   Smokeless tobacco: Never  Vaping Use   Vaping Use: Never used  Substance and Sexual Activity   Alcohol use: No   Drug use: No   Sexual activity: Not on file  Other Topics Concern   Not on file  Social History Narrative   Not on file   Social Determinants of Health   Financial Resource Strain: Not on file  Food Insecurity: Not on file  Transportation Needs: Not on file  Physical Activity: Not on file  Stress: Not on file  Social Connections: Not on file  Intimate Partner Violence: Not on file      Marcial Pacas, M.D. Ph.D.  [facility] [address] [place], [place] [address] Ph: [phone] Fax: [phone]  CC:  Brunetta Genera, MD [place],    [id number]  Lavone Orn, MD

## 2022-03-28 ENCOUNTER — Telehealth: Payer: Self-pay | Admitting: *Deleted

## 2022-03-28 NOTE — Telephone Encounter (Signed)
Patient called - stated he needed refill of Lenalidomide as he needs to restart taking on Tuesday 04/02/22. He had contacted [facility] for refill and was informed his provider needed to send in Rx. Per Chart, Rx ordered on 03/21/22 with  [facility] [id number].   Contacted [facility] - RX Not received by [facility] on 03/21/22 per Maudie Mercury N, Rph. Verbal Rx w/auth dated 03/21/22 given to [place] w/ request to mark urgent/expedite as patient needs by next Tuesday. She verbalized understanding of refill requerst with dose change to '5mg'$  and need to expedite.  Contacted patient with this information. He verbalized understanding of all information and expressed appreciation for call.

## 2022-04-05 ENCOUNTER — Telehealth: Payer: Self-pay | Admitting: Neurology

## 2022-04-05 NOTE — Telephone Encounter (Signed)
Mcarthur Rossetti Josem Kaufmann: [id number] exp. 04/05/22-05/05/22 sent to GI

## 2022-04-07 ENCOUNTER — Encounter (HOSPITAL_COMMUNITY): Payer: Self-pay | Admitting: Emergency Medicine

## 2022-04-07 ENCOUNTER — Emergency Department (HOSPITAL_COMMUNITY)
Admission: EM | Admit: 2022-04-07 | Discharge: 2022-04-07 | Disposition: A | Discharge status: Home / Self Care | Payer: Medicare HMO | Attending: Emergency Medicine | Admitting: Emergency Medicine

## 2022-04-07 ENCOUNTER — Emergency Department (HOSPITAL_COMMUNITY): Payer: Medicare HMO

## 2022-04-07 DIAGNOSIS — M791 Myalgia, unspecified site: Secondary | ICD-10-CM | POA: Diagnosis present

## 2022-04-07 DIAGNOSIS — D61818 Other pancytopenia: Secondary | ICD-10-CM | POA: Diagnosis not present

## 2022-04-07 DIAGNOSIS — U071 COVID-19: Secondary | ICD-10-CM | POA: Diagnosis not present

## 2022-04-07 DIAGNOSIS — R059 Cough, unspecified: Secondary | ICD-10-CM | POA: Insufficient documentation

## 2022-04-07 DIAGNOSIS — Z5321 Procedure and treatment not carried out due to patient leaving prior to being seen by health care provider: Secondary | ICD-10-CM | POA: Insufficient documentation

## 2022-04-07 DIAGNOSIS — R6883 Chills (without fever): Secondary | ICD-10-CM | POA: Diagnosis not present

## 2022-04-07 LAB — COMPREHENSIVE METABOLIC PANEL
ALT: 42 U/L (ref 0–44)
AST: 49 U/L — ABNORMAL HIGH (ref 15–41)
Albumin: 3.6 g/dL (ref 3.5–5.0)
Alkaline Phosphatase: 56 U/L (ref 38–126)
Anion gap: 8 (ref 5–15)
BUN: 12 mg/dL (ref 8–23)
CO2: 23 mmol/L (ref 22–32)
Calcium: 8.6 mg/dL — ABNORMAL LOW (ref 8.9–10.3)
Chloride: 105 mmol/L (ref 98–111)
Creatinine, Ser: 1.49 mg/dL — ABNORMAL HIGH (ref 0.61–1.24)
GFR, Estimated: 48 mL/min — ABNORMAL LOW (ref >60)
Glucose, Bld: 114 mg/dL — ABNORMAL HIGH (ref 70–99)
Potassium: 3.5 mmol/L (ref 3.5–5.1)
Sodium: 136 mmol/L (ref 135–145)
Total Bilirubin: 1.5 mg/dL — ABNORMAL HIGH (ref 0.3–1.2)
Total Protein: 7 g/dL (ref 6.5–8.1)

## 2022-04-07 LAB — CBC WITH DIFFERENTIAL/PLATELET
Abs Immature Granulocytes: 0.01 10*3/uL (ref 0.00–0.07)
Basophils Absolute: 0 10*3/uL (ref 0.0–0.1)
Basophils Relative: 2 %
Eosinophils Absolute: 0 10*3/uL (ref 0.0–0.5)
Eosinophils Relative: 1 %
HCT: 32.6 % — ABNORMAL LOW (ref 39.0–52.0)
Hemoglobin: 11 g/dL — ABNORMAL LOW (ref 13.0–17.0)
Immature Granulocytes: 1 %
Lymphocytes Relative: 25 %
Lymphs Abs: 0.5 10*3/uL — ABNORMAL LOW (ref 0.7–4.0)
MCH: 30.5 pg (ref 26.0–34.0)
MCHC: 33.7 g/dL (ref 30.0–36.0)
MCV: 90.3 fL (ref 80.0–100.0)
Monocytes Absolute: 0.2 10*3/uL (ref 0.1–1.0)
Monocytes Relative: 12 %
Neutro Abs: 1.1 10*3/uL — ABNORMAL LOW (ref 1.7–7.7)
Neutrophils Relative %: 59 %
Platelets: 78 10*3/uL — ABNORMAL LOW (ref 150–400)
RBC: 3.61 MIL/uL — ABNORMAL LOW (ref 4.22–5.81)
RDW: 14.7 % (ref 11.5–15.5)
WBC: 1.9 10*3/uL — ABNORMAL LOW (ref 4.0–10.5)
nRBC: 0 % (ref 0.0–0.2)

## 2022-04-07 LAB — RESP PANEL BY RT-PCR (FLU A&B, COVID) ARPGX2
Influenza A by PCR: NEGATIVE
Influenza B by PCR: NEGATIVE
SARS Coronavirus 2 by RT PCR: POSITIVE — AB

## 2022-04-07 NOTE — ED Triage Notes (Signed)
Patient reports Covid positive today at home. States body aches, chills, cough x2 days. Hx multiple myeloma. Denies chest pain, SOB, N/V/D.

## 2022-04-07 NOTE — ED Notes (Signed)
Registration informed triage staff that pt did not wish to stay and wait for provider and left emergency department.

## 2022-04-07 NOTE — ED Provider Triage Note (Signed)
Emergency Medicine Provider Triage Evaluation Note  Brian Levine , a 76 y.o. male  was evaluated in triage.  Pt complains of nasal congestion, sore throat, cough since last night.  Patient states he took an at home COVID test earlier this morning which was positive.  He presents emergency department for further evaluation given history of multiple myeloma on immunosuppressive medication.  He notes intermittent fever that has been responding at home to Tylenol.  Review of Systems  Positive: See above Negative:   Physical Exam  BP 126/66 (BP Location: Left Arm)   Pulse 68   Temp 98.7 F (37.1 C) (Oral)   Resp 20   SpO2 100%  Gen:   Awake, no distress   Resp:  Normal effort MSK:   Moves extremities without difficulty  Other:  Lungs clear to auscultation.  Medical Decision Making  Medically screening exam initiated at 3:04 PM.  Appropriate orders placed.  Brian Levine was informed that the remainder of the evaluation will be completed by another provider, this initial triage assessment does not replace that evaluation, and the importance of remaining in the ED until their evaluation is complete.     Brian Levine, [place] 04/07/22 [phone]

## 2022-04-08 DIAGNOSIS — U071 COVID-19: Secondary | ICD-10-CM | POA: Diagnosis not present

## 2022-04-08 DIAGNOSIS — C9000 Multiple myeloma not having achieved remission: Secondary | ICD-10-CM | POA: Diagnosis not present

## 2022-04-09 ENCOUNTER — Other Ambulatory Visit: Payer: Medicare HMO

## 2022-04-09 ENCOUNTER — Ambulatory Visit: Payer: Medicare HMO

## 2022-04-09 NOTE — Progress Notes (Signed)
Returned call to pt's wife. Pt's wife reported pt has COVID. PCP prescribed paxlovid. Per Dr Irene Limbo paxlovid ok to take . Pt instructed to hold Revlimid for 2 weeks /until feeling better. Pt's wife verbalized understanding.

## 2022-04-15 ENCOUNTER — Other Ambulatory Visit: Payer: Self-pay | Admitting: Hematology

## 2022-04-15 DIAGNOSIS — C9000 Multiple myeloma not having achieved remission: Secondary | ICD-10-CM

## 2022-04-19 ENCOUNTER — Ambulatory Visit
Admission: RE | Admit: 2022-04-19 | Discharge: 2022-04-19 | Disposition: A | Discharge status: Home / Self Care | Payer: Medicare HMO | Source: Ambulatory Visit | Attending: Neurology | Admitting: Neurology

## 2022-04-19 DIAGNOSIS — R202 Paresthesia of skin: Secondary | ICD-10-CM

## 2022-04-22 ENCOUNTER — Other Ambulatory Visit: Payer: Self-pay

## 2022-04-22 ENCOUNTER — Encounter: Payer: Self-pay | Admitting: Hematology

## 2022-05-06 ENCOUNTER — Other Ambulatory Visit: Payer: Self-pay

## 2022-05-06 DIAGNOSIS — C9000 Multiple myeloma not having achieved remission: Secondary | ICD-10-CM

## 2022-05-07 ENCOUNTER — Other Ambulatory Visit: Payer: Self-pay

## 2022-05-07 ENCOUNTER — Inpatient Hospital Stay: Payer: Medicare HMO

## 2022-05-07 ENCOUNTER — Inpatient Hospital Stay: Payer: Medicare HMO | Attending: Hematology

## 2022-05-07 ENCOUNTER — Inpatient Hospital Stay: Payer: Medicare HMO | Admitting: Hematology

## 2022-05-07 VITALS — BP 141/71 | HR 60 | Temp 97.9°F | Resp 18 | Ht 71.0 in | Wt 185.3 lb

## 2022-05-07 DIAGNOSIS — Z87891 Personal history of nicotine dependence: Secondary | ICD-10-CM | POA: Diagnosis not present

## 2022-05-07 DIAGNOSIS — N182 Chronic kidney disease, stage 2 (mild): Secondary | ICD-10-CM | POA: Diagnosis not present

## 2022-05-07 DIAGNOSIS — C9000 Multiple myeloma not having achieved remission: Secondary | ICD-10-CM | POA: Insufficient documentation

## 2022-05-07 DIAGNOSIS — D709 Neutropenia, unspecified: Secondary | ICD-10-CM | POA: Insufficient documentation

## 2022-05-07 DIAGNOSIS — Z7982 Long term (current) use of aspirin: Secondary | ICD-10-CM | POA: Diagnosis not present

## 2022-05-07 DIAGNOSIS — Z79899 Other long term (current) drug therapy: Secondary | ICD-10-CM | POA: Diagnosis not present

## 2022-05-07 LAB — CBC WITH DIFFERENTIAL (CANCER CENTER ONLY)
Abs Immature Granulocytes: 0.01 10*3/uL (ref 0.00–0.07)
Basophils Absolute: 0 10*3/uL (ref 0.0–0.1)
Basophils Relative: 0 %
Eosinophils Absolute: 0.1 10*3/uL (ref 0.0–0.5)
Eosinophils Relative: 4 %
HCT: 33 % — ABNORMAL LOW (ref 39.0–52.0)
Hemoglobin: 11.6 g/dL — ABNORMAL LOW (ref 13.0–17.0)
Immature Granulocytes: 0 %
Lymphocytes Relative: 40 %
Lymphs Abs: 1 10*3/uL (ref 0.7–4.0)
MCH: 30.4 pg (ref 26.0–34.0)
MCHC: 35.2 g/dL (ref 30.0–36.0)
MCV: 86.4 fL (ref 80.0–100.0)
Monocytes Absolute: 0.2 10*3/uL (ref 0.1–1.0)
Monocytes Relative: 9 %
Neutro Abs: 1.1 10*3/uL — ABNORMAL LOW (ref 1.7–7.7)
Neutrophils Relative %: 47 %
Platelet Count: 77 10*3/uL — ABNORMAL LOW (ref 150–400)
RBC: 3.82 MIL/uL — ABNORMAL LOW (ref 4.22–5.81)
RDW: 14.7 % (ref 11.5–15.5)
WBC Count: 2.4 10*3/uL — ABNORMAL LOW (ref 4.0–10.5)
nRBC: 0 % (ref 0.0–0.2)

## 2022-05-07 LAB — CMP (CANCER CENTER ONLY)
ALT: 20 U/L (ref 0–44)
AST: 21 U/L (ref 15–41)
Albumin: 3.9 g/dL (ref 3.5–5.0)
Alkaline Phosphatase: 52 U/L (ref 38–126)
Anion gap: 5 (ref 5–15)
BUN: 9 mg/dL (ref 8–23)
CO2: 28 mmol/L (ref 22–32)
Calcium: 9.2 mg/dL (ref 8.9–10.3)
Chloride: 105 mmol/L (ref 98–111)
Creatinine: 1.59 mg/dL — ABNORMAL HIGH (ref 0.61–1.24)
GFR, Estimated: 45 mL/min — ABNORMAL LOW (ref >60)
Glucose, Bld: 110 mg/dL — ABNORMAL HIGH (ref 70–99)
Potassium: 3.7 mmol/L (ref 3.5–5.1)
Sodium: 138 mmol/L (ref 135–145)
Total Bilirubin: 0.9 mg/dL (ref 0.3–1.2)
Total Protein: 7 g/dL (ref 6.5–8.1)

## 2022-05-07 MED ORDER — ZOLEDRONIC ACID 4 MG/100ML IV SOLN
4.0000 mg | Freq: Once | INTRAVENOUS | Status: AC
  Administered 2022-05-07: 4 mg via INTRAVENOUS
  Filled 2022-05-07: qty 100

## 2022-05-07 MED ORDER — SODIUM CHLORIDE 0.9 % IV SOLN: INTRAVENOUS | Status: DC

## 2022-05-07 NOTE — Patient Instructions (Signed)

## 2022-05-08 LAB — KAPPA/LAMBDA LIGHT CHAINS
Kappa free light chain: 48.9 mg/L — ABNORMAL HIGH (ref 3.3–19.4)
Kappa, lambda light chain ratio: 1.49 (ref 0.26–1.65)
Lambda free light chains: 32.9 mg/L — ABNORMAL HIGH (ref 5.7–26.3)

## 2022-05-09 ENCOUNTER — Telehealth: Payer: Self-pay

## 2022-05-09 NOTE — Patient Outreach (Signed)
Care Coordination

## 2022-05-10 ENCOUNTER — Other Ambulatory Visit: Payer: Self-pay | Admitting: Hematology

## 2022-05-10 DIAGNOSIS — C9000 Multiple myeloma not having achieved remission: Secondary | ICD-10-CM

## 2022-05-13 ENCOUNTER — Encounter: Payer: Self-pay | Admitting: Hematology

## 2022-05-13 LAB — MULTIPLE MYELOMA PANEL, SERUM
Albumin SerPl Elph-Mcnc: 3.7 g/dL (ref 2.9–4.4)
Albumin/Glob SerPl: 1.3 (ref 0.7–1.7)
Alpha 1: 0.3 g/dL (ref 0.0–0.4)
Alpha2 Glob SerPl Elph-Mcnc: 0.6 g/dL (ref 0.4–1.0)
B-Globulin SerPl Elph-Mcnc: 0.9 g/dL (ref 0.7–1.3)
Gamma Glob SerPl Elph-Mcnc: 1.2 g/dL (ref 0.4–1.8)
Globulin, Total: 2.9 g/dL (ref 2.2–3.9)
IgA: 364 mg/dL (ref 61–437)
IgG (Immunoglobin G), Serum: 1069 mg/dL (ref 603–1613)
IgM (Immunoglobulin M), Srm: 27 mg/dL (ref 15–143)
Total Protein ELP: 6.6 g/dL (ref 6.0–8.5)

## 2022-05-13 NOTE — Progress Notes (Signed)
HEMATOLOGY/ONCOLOGY CLINIC NOTE  DOS.05/07/2022   Patient Care Team: Lavone Orn, MD as PCP - General (Internal Medicine)  CHIEF COMPLAINTS/PURPOSE OF VISIT:  Follow-up for continued evaluation and management of multiple myeloma  HISTORY OF PRESENTING ILLNESS:  Please see previous notes for details on initial presentation.  INTERVAL HISTORY  Patient is here for continued evaluation and management of his multiple myeloma and neck cycle of daratumumab carfilzomib dexamethasone chemotherapy.  Patient notes no acute new symptoms since his last clinic visit.  No acute new focal symptoms. No new infections.  No focal bone pains.   MEDICAL HISTORY:  Past Medical History:  Diagnosis Date   BPH (benign prostatic hyperplasia) 2004   Constipation    Other pancytopenia ([place]) 05/04/2013   Thrombocytopenia, unspecified ([place]) 08/17/2013   Weight loss 05/04/2013    SURGICAL HISTORY: Past Surgical History:  Procedure Laterality Date   APPENDECTOMY     COLONOSCOPY WITH PROPOFOL N/A 07/17/2015   Procedure: COLONOSCOPY WITH PROPOFOL;  Surgeon: Garlan Fair, MD;  [facility];  Service: Endoscopy;  Laterality: N/A;   CYSTOSCOPY WITH FULGERATION N/A 11/08/2020   Procedure: CYSTOSCOPY WITH FULGERATION OF BLEEDERS;  Surgeon: Irine Seal, MD;  [facility];  Service: Urology;  Laterality: N/A;   CYSTOSCOPY WITH FULGERATION N/A 12/01/2020   Procedure: CYSTOSCOPY WITH FULGERATION OF  PROSTATE;  Surgeon: Irine Seal, MD;  [facility];  Service: Urology;  Laterality: N/A;   CYSTOSCOPY WITH INSERTION OF UROLIFT N/A 08/22/2015   Procedure: CYSTOSCOPY WITH INSERTION OF UROLIFT;  Surgeon: Irine Seal, MD;  [facility];  Service: Urology;  Laterality: N/A;   CYSTOSCOPY WITH LITHOLAPAXY N/A 10/24/2020   Procedure: CYSTOSCOPY WITH LITHOLAPAXY;  Surgeon: Irine Seal, MD;  [facility];  Service: Urology;  Laterality: N/A;  REQUESTING 1 HR FOR CASE   EYE SURGERY     right  cataract surgery with lens implant   TESTICLE SURGERY Right 2011   hydrocele   TRANSURETHRAL RESECTION OF PROSTATE N/A 10/24/2020   Procedure: TRANSURETHRAL RESECTION OF THE PROSTATE (TURP);  Surgeon: Irine Seal, MD;  [facility];  Service: Urology;  Laterality: N/A;    SOCIAL HISTORY: Social History   Socioeconomic History   Marital status: Married    Spouse name: Not on file   Number of children: Not on file   Years of education: Not on file   Highest education level: Not on file  Occupational History   Not on file  Tobacco Use   Smoking status: Former    Packs/day: 1.50    Years: 30.00    Total pack years: 45.00    Types: Cigarettes    Quit date: 07/30/1995    Years since quitting: 26.8   Smokeless tobacco: Never  Vaping Use   Vaping Use: Never used  Substance and Sexual Activity   Alcohol use: No   Drug use: No   Sexual activity: Not on file  Other Topics Concern   Not on file  Social History Narrative   Not on file   Social Determinants of Health   Financial Resource Strain: Not on file  Food Insecurity: No Food Insecurity (05/09/2022)   Hunger Vital Sign    Worried About Running Out of Food in the Last Year: Never true    [place] in the Last Year: Never true  Transportation Needs: No Transportation Needs (05/09/2022)   PRAPARE - Hydrologist (Medical): No    Lack of Transportation (  Non-Medical): No  Physical Activity: Not on file  Stress: Not on file  Social Connections: Not on file  Intimate Partner Violence: Not on file    FAMILY HISTORY: No family history on file.  ALLERGIES:  has No Known Allergies.  MEDICATIONS:  Current Outpatient Medications  Medication Sig Dispense Refill   acetaminophen (TYLENOL) 325 MG tablet Take 650 mg by mouth every 6 (six) hours as needed (for pain.).     aspirin EC 81 MG tablet Take 1 tablet (81 mg total) by mouth daily. Swallow whole. 30 tablet 12   b complex vitamins capsule  Take 1 capsule by mouth daily. 30 capsule 11   calcium carbonate (TUMS - DOSED IN MG ELEMENTAL CALCIUM) 500 MG chewable tablet Chew 1 tablet by mouth daily.     cholecalciferol (VITAMIN D3) 25 MCG (1000 UNIT) tablet Take 5,000 Units by mouth daily.     doxazosin (CARDURA) 8 MG tablet Take 8 mg by mouth daily in the afternoon.     finasteride (PROSCAR) 5 MG tablet Take 5 mg by mouth daily in the afternoon.     lenalidomide (REVLIMID) 5 MG capsule TAKE 1 CAPSULE (5MG) BY MOUTH EVERY DAY ON DAYS 1-21 THEN TAKE 7 DAYS OFF 21 capsule 0   pravastatin (PRAVACHOL) 40 MG tablet Take 40 mg by mouth daily before supper.     No current facility-administered medications for this visit.    REVIEW OF SYSTEMS:   10 Point review of Systems was done is negative except as noted above.  PHYSICAL EXAMINATION: ECOG PERFORMANCE STATUS: 1 - Symptomatic but completely ambulatory  Vitals:   05/07/22 1344  BP: (!) 141/71  Pulse: 60  Resp: 18  Temp: 97.9 F (36.6 C)  SpO2: 100%    Filed Weights   05/07/22 1344  Weight: 185 lb 4.8 oz (84.1 kg)  NAD GENERAL:alert, in no acute distress and comfortable SKIN: no acute rashes, no significant lesions EYES: conjunctiva are pink and non-injected, sclera anicteric OROPHARYNX: MMM, no exudates, no oropharyngeal erythema or ulceration NECK: supple, no JVD LYMPH:  no palpable lymphadenopathy in the cervical, axillary or inguinal regions LUNGS: clear to auscultation b/l with normal respiratory effort HEART: regular rate & rhythm ABDOMEN:  normoactive bowel sounds , non tender, not distended. Extremity: no pedal edema PSYCH: alert & oriented x 3 with fluent speech NEURO: no focal motor/sensory deficits    LABORATORY DATA:      Latest Ref Rng & Units 05/07/2022    1:17 PM 04/07/2022    3:45 PM 03/12/2022    9:45 AM  CBC  WBC 4.0 - 10.5 K/uL 2.4  1.9  2.1   Hemoglobin 13.0 - 17.0 g/dL 11.6  11.0  11.5   Hematocrit 39.0 - 52.0 % 33.0  32.6  32.8   Platelets  150 - 400 K/uL 77  78  90   ANC of 1100     Latest Ref Rng & Units 05/07/2022    1:17 PM 04/07/2022    3:45 PM 03/12/2022    9:45 AM  CMP  Glucose 70 - 99 mg/dL 110  114  99   BUN 8 - 23 mg/dL _0 Creatinine 0.61 - 1.24 mg/dL 1.59  1.49  1.55   Sodium 135 - 145 mmol/L 138  136  139   Potassium 3.5 - 5.1 mmol/L 3.7  3.5  3.7   Chloride 98 - 111 mmol/L 105  105  109   CO2 22 -  32 mmol/L _0 Calcium 8.9 - 10.3 mg/dL 9.2  8.6  9.6   Total Protein 6.5 - 8.1 g/dL 7.0  7.0  7.0   Total Bilirubin 0.3 - 1.2 mg/dL 0.9  1.5  1.3   Alkaline Phos 38 - 126 U/L 52  56  53   AST 15 - 41 U/L 21  49  23   ALT 0 - 44 U/L 20  42  22     Myeloma labs on 09/26/2021 within normal limits  ,not observed M protein   Component     Latest Ref Rng & Units 08/28/2021  IgG (Immunoglobin G), Serum     603 - 1,613 mg/dL 906  IgA     61 - 437 mg/dL 218  IgM (Immunoglobulin M), Srm     15 - 143 mg/dL 28  Total Protein ELP     6.0 - 8.5 g/dL 6.5  Albumin SerPl Elph-Mcnc     2.9 - 4.4 g/dL 3.8  Alpha 1     0.0 - 0.4 g/dL 0.2  Alpha2 Glob SerPl Elph-Mcnc     0.4 - 1.0 g/dL 0.7  B-Globulin SerPl Elph-Mcnc     0.7 - 1.3 g/dL 0.8  Gamma Glob SerPl Elph-Mcnc     0.4 - 1.8 g/dL 0.9  M Protein SerPl Elph-Mcnc     Not Observed g/dL Not Observed  Globulin, Total     2.2 - 3.9 g/dL 2.7  Albumin/Glob SerPl     0.7 - 1.7 1.5  IFE 1      Comment  Please Note (HCV):      Comment  Kappa free light chain     3.3 - 19.4 mg/L 39.2 (H)  Lambda free light chains     5.7 - 26.3 mg/L 28.7 (H)  Kappa, lambda light chain ratio     0.26 - 1.65 1.37   05/18/2021: PET SCAN Interval resolution of the hypermetabolism seen previously in the posterior left iliac bone. 2. Tiny hypermetabolic focus in the region of the left adrenal gland without nodule or mass lesion evident on CT imaging. Close follow-up recommended. 3. Otherwise no new or suspicious hypermetabolism on today's study. 4.  Aortic  Atherosclerois (ICD10-170.0) 5. Prostatomegaly  06/01/2021: Bone Marrow Biopsy: Surgical Pathology  CASE: [id number]  PATIENT: Brian Levine  Bone Marrow Report      Clinical History: History of multiple myeloma , (BH)   DIAGNOSIS:   BONE MARROW, ASPIRATE, CLOT, CORE:  -Hypercellular bone marrow for age with trilineage hematopoiesis and 1%  plasma cells  -See comment   PERIPHERAL BLOOD:  -Pancytopenia   COMMENT:   The bone marrow is hypercellular with trilineage hematopoiesis and nonspecific myeloid changes likely related to therapy.  In this  background, the plasma cells represent 1% of all cells in the aspirate associated with scattering of interstitial cells in the clot and biopsy sections with lack of large aggregates or sheets. The plasma cells display polyclonal staining pattern for kappa and lambda light chains and hence, there is no definitive or diagnostic evidence of residual plasma cell neoplasm.  Correlation with cytogenetic and FISH studies is recommended.   CLOT AND BIOPSY: The sections show cellularity ranging from 20 to 70%  with a mixture of myeloid cell types.  Large clusters or sheets of plasma cells are not identified.  Significant lymphoid aggregates are  not seen.  Immunohistochemical stain for CD138 and in situ hybridization  for kappa and lambda  were performed on blocks B1 and C1 with appropriate  controls.  CD138 highlights an extremely minor plasma cell component mostly consisting of scattering of interstitial cells and displays  polyclonal staining pattern for kappa and lambda light chains.  Bone Marrow count performed on 500 cells shows:  Blasts:   0%   Myeloid:  39%  Promyelocytes: 0%   Erythroid:     52%  Myelocytes:    6%   Lymphocytes:   6%  Metamyelocytes:     3%   Plasma cells:  1%  Bands:    11%  Neutrophils:   14%  M:E ratio:     0.75  Eosinophils:   5%  Basophils:     0%  Monocytes:     2%   Lab Data: CBC performed on 06/01/21  shows:  WBC: 2.2 k/uL  Neutrophils:   56%  Hgb: 10.4 g/dL Lymphocytes:   26%  HCT: 31.0 %    Monocytes:     12%  MCV: 94.8 fL   Eosinophils:   4%  RDW: 14.2 %    Basophils:     2%  PLT: 135 k/uL     ASSESSMENT & PLAN:   76 yo with    1) Recurrent/persistent hematuria bleeding after prostate surgery. - resolved No fhx or previous personal h/o bleeding issues.  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 No other acute new symptoms since his last clinic visit.. No new focal bone pains or adverse localized signs on clinical examination. Labs done on 05/07/2022 were discussed in detail with the patient and show mild leukopenia of 2.4k  We discussed cutting down the Revlimid to 5 mg p.o. 3 weeks on 1 week off to address the muscle cramps and pancytopenia. -Patient will also continue vitamin D 5000 units p.o. daily -Continue aspirin 81 mg p.o. daily -Continue Zometa every 8 weeks  5) lower extremity tingling numbness and intermittent purple discoloration rule out peripheral arterial disease Ultrasound with ABIs show no overt vascular obstruction.  We will refer to neurology to rule out radiculopathy due to tingling and numbness and burning discomfort  FOLLOW UP:  Switching Zometa to every 8 weeks. Plz cancel appointment for labs/zometa in 4 weeks RTC with Dr Irene Limbo with labs and appointment for zometa in 8 weeks  The total time spent in the appointment was 30 minutes*.  All of the patient's questions were answered with  apparent satisfaction. The patient knows to call the clinic with any problems, questions or concerns.   Sullivan Lone MD MS AAHIVMS [facility] Hematology/Oncology Physician [facility]  .*Total Encounter Time as defined by the Centers for Medicare and Medicaid Services includes, in addition to the face-to-face time of a patient visit (documented in the note above) non-face-to-face time: obtaining and reviewing outside history, ordering and reviewing medications, tests or procedures, care coordination (communications with other health care professionals or caregivers) and documentation in the medical record.

## 2022-05-14 ENCOUNTER — Other Ambulatory Visit: Payer: Self-pay

## 2022-05-14 DIAGNOSIS — C9000 Multiple myeloma not having achieved remission: Secondary | ICD-10-CM

## 2022-05-14 MED ORDER — LENALIDOMIDE 5 MG PO CAPS: ORAL_CAPSULE | ORAL | 0 refills | Status: DC

## 2022-05-18 DIAGNOSIS — Z23 Encounter for immunization: Secondary | ICD-10-CM | POA: Diagnosis not present

## 2022-05-29 ENCOUNTER — Ambulatory Visit: Payer: Medicare HMO | Admitting: Neurology

## 2022-05-29 ENCOUNTER — Ambulatory Visit (INDEPENDENT_AMBULATORY_CARE_PROVIDER_SITE_OTHER): Payer: Medicare HMO | Admitting: Neurology

## 2022-05-29 ENCOUNTER — Encounter: Payer: Self-pay | Admitting: Neurology

## 2022-05-29 VITALS — BP 150/70 | HR 67 | Ht 71.0 in | Wt 187.5 lb

## 2022-05-29 DIAGNOSIS — E559 Vitamin D deficiency, unspecified: Secondary | ICD-10-CM | POA: Diagnosis not present

## 2022-05-29 DIAGNOSIS — R7989 Other specified abnormal findings of blood chemistry: Secondary | ICD-10-CM | POA: Diagnosis not present

## 2022-05-29 DIAGNOSIS — R202 Paresthesia of skin: Secondary | ICD-10-CM | POA: Diagnosis not present

## 2022-05-29 DIAGNOSIS — E538 Deficiency of other specified B group vitamins: Secondary | ICD-10-CM | POA: Diagnosis not present

## 2022-05-29 DIAGNOSIS — Z01812 Encounter for preprocedural laboratory examination: Secondary | ICD-10-CM | POA: Diagnosis not present

## 2022-05-29 DIAGNOSIS — R768 Other specified abnormal immunological findings in serum: Secondary | ICD-10-CM | POA: Diagnosis not present

## 2022-05-29 DIAGNOSIS — R7309 Other abnormal glucose: Secondary | ICD-10-CM | POA: Diagnosis not present

## 2022-05-29 NOTE — Progress Notes (Signed)
ASSESSMENT AND PLAN  Brian Levine is a 76 y.o. 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  He denies significant pain.  Lab evaluation for etiology.  DIAGNOSTIC DATA (LABS, IMAGING, TESTING) - I reviewed patient records, labs, notes, testing and imaging myself where available.   MEDICAL HISTORY:  Brian Levine is a 76 year old male, seen in request by his oncologist Dr. Irene Limbo, [place], for evaluation of bilateral lower extremity paresthesia, his primary care physician is Dr. Laurann Montana, [facility], initial evaluation was March 25, 2022  I reviewed and summarized the referring note. PMHX. Multiple myeloma. BPH HLD  Patient had a persistent hematuria and bleeding following his prostate surgery in March 2022,  Peripheral blood evaluation showed leukopenia and thrombocytopenia, bone marrow biopsy confirmed the diagnosis of multiple myeloma in July 2022, began to receive Carfilzomib treatment, with good response, now taking Revlimid.  Patient has been active, including now, he works out at [employer] regularly, walking without any difficulties  He does have intermittent low back, neck pain, especially his neck, he also have a short flareup of neck pain radiating to his scalp, shoulder, but not long-lasting, he feels a cracking sound when turning his neck  Around April 2023, he began to notice bottom of his feet numbness, quickly extending to mid shin level, he denies gait abnormality, denies upper extremity involvement, denies bowel and bladder incontinence Personally reviewed MRI of lumbar spine in June 2023, multilevel degenerative changes,  most prominent L5-S1, L4-5, no significant canal stenosis  UPDATE May 29 2022: Patient complains of symmetric asending paresthesia paresthesia to mid shin level, no hands involvement  Personally reviewed MRI of cervical spine April 19, 2021, mild multilevel degenerative changes no significant canal foraminal narrowing  Laboratory evaluation in October 2023, decreased WBC 2.4, mild anemia hemoglobin of 11.6, positive free kappa and lambda light chains, protein electrophoresis showed negative M protein, CMP showed elevated creatinine 1.59 GFR of 45,   Reviewed hematology Dr. Irene Limbo evaluation March 12, 2022, lambda light chain  Note multiple myeloma, symptoms are stable, but abnormal CT, plasma cell display polyclonal staining pattern, Kappa and lambda light chain, there is no definite or diagnostic evidence of residual plasma cell neoplasm, [place] in the biopsy consists of scattering of interstitial cells and there polyclonal staining pattern for kappa and lambda light chain, CBC continues to show significant leukopenia and some thrombocytopenia, Arterial Doppler study of bilateral lower extremity showed no significant arterial obstruction  The decision was to cutting down  Revlimid to 97m   Personally reviewed MRI of the cervical spine in Sept 2023 The spinal cord appears normal. At C5-C6 and C6-C7, there are degenerative changes causing minimal spinal stenosis and mild foraminal narrowing but no nerve root compression. Degenerative changes at C3-C4, and C4-C5 do not lead to spinal stenosis or nerve root compression.  MRI lumbar in June 2023,: 1. Negative for bone lesion or fracture. 2. Marrow edema and enhancement associated with a L1-2 left paracentral herniation that contacts but does not compress the left L2 nerve root. 3. L5-S1 left foraminal impingement. L4-5 moderate right foraminal narrowing.   EMG/NCS 05/29/2022 showed mild length dependent sensorimotor neuropathy  PHYSICAL  EXAM:   BP 150/70,  HR 67  PHYSICAL EXAMNIATION:  Gen: NAD, conversant, well nourised, well groomed                     Cardiovascular: Regular rate rhythm, no peripheral edema, warm, nontender. Eyes: Conjunctivae clear without exudates or hemorrhage Neck: Supple, no carotid bruits. Pulmonary: Clear to auscultation bilaterally   NEUROLOGICAL EXAM:  MENTAL STATUS: Speech/cognition: Awake, alert, oriented to history taking and casual conversation CRANIAL NERVES: CN II: Visual fields are full to confrontation. Pupils are round equal and briskly reactive to light. CN III, IV, VI: extraocular movement are normal. No ptosis. CN V: Facial sensation is intact to light touch CN VII: Face is symmetric with normal eye closure  CN VIII: Hearing is normal to causal conversation. CN IX, X: Phonation is normal. CN XI: Head turning and shoulder shrug are intact  MOTOR: There is no pronator drift of out-stretched arms. Muscle bulk and tone are normal. Muscle strength is normal.  REFLEXES: Reflexes are 2+ and symmetric at the biceps, triceps, 3/3 knees, and absent at ankles. Plantar responses are flexor.  SENSORY: Decreased vibratory sensation at toes, preserved proprioception, mildly length dependent decreased light touch, pin prick to ankle level  COORDINATION: There is no trunk or limb dysmetria noted.  GAIT/STANCE: Posture is normal. Gait is steady    REVIEW OF SYSTEMS:  Full 14 system review of systems performed and notable only for as above All other review of systems were negative.   ALLERGIES: No Known Allergies  HOME MEDICATIONS: Current Outpatient Medications  Medication Sig Dispense Refill   acetaminophen (TYLENOL) 325 MG tablet Take 650 mg by mouth every 6 (six) hours as needed (for pain.).     aspirin EC 81 MG tablet Take 1 tablet (81 mg total) by mouth daily. Swallow whole. 30 tablet 12   b complex vitamins capsule Take 1 capsule by mouth daily. 30 capsule 11    calcium carbonate (TUMS - DOSED IN MG ELEMENTAL CALCIUM) 500 MG chewable tablet Chew 1 tablet by mouth daily.     cholecalciferol (VITAMIN D3) 25 MCG (1000 UNIT) tablet Take 5,000 Units by mouth daily.     doxazosin (CARDURA) 8 MG tablet Take 8 mg by mouth daily in the afternoon.     finasteride (PROSCAR) 5 MG tablet Take 5 mg by mouth daily in the afternoon.     lenalidomide (REVLIMID) 5 MG capsule TAKE 1 CAPSULE (5MG) BY MOUTH EVERY DAY ON DAYS 1-21 THEN TAKE 7 DAYS OFF 21 capsule 0   pravastatin (PRAVACHOL) 40 MG tablet Take 40 mg by mouth daily before supper.     No current facility-administered medications for this visit.    PAST MEDICAL HISTORY: Past Medical History:  Diagnosis Date   BPH (benign prostatic hyperplasia) 2004   Constipation    Other pancytopenia ([place]) 05/04/2013   Thrombocytopenia, unspecified ([place]) 08/17/2013   Weight loss 05/04/2013    PAST SURGICAL HISTORY: Past Surgical History:  Procedure Laterality Date   APPENDECTOMY     COLONOSCOPY WITH PROPOFOL N/A 07/17/2015   Procedure: COLONOSCOPY WITH PROPOFOL;  Surgeon: Garlan Fair, MD;  [facility];  Service: Endoscopy;  Laterality: N/A;   CYSTOSCOPY WITH FULGERATION N/A 11/08/2020   Procedure: CYSTOSCOPY WITH FULGERATION OF BLEEDERS;  Surgeon: Irine Seal, MD;  [facility];  Service: Urology;  Laterality: N/A;   CYSTOSCOPY WITH FULGERATION N/A 12/01/2020   Procedure: CYSTOSCOPY WITH FULGERATION OF  PROSTATE;  Surgeon: Irine Seal, MD;  [facility];  Service: Urology;  Laterality: N/A;   CYSTOSCOPY WITH INSERTION OF UROLIFT N/A 08/22/2015   Procedure: CYSTOSCOPY WITH INSERTION OF UROLIFT;  Surgeon: Irine Seal, MD;  [facility];  Service: Urology;  Laterality: N/A;   CYSTOSCOPY WITH LITHOLAPAXY N/A 10/24/2020   Procedure: CYSTOSCOPY WITH LITHOLAPAXY;  Surgeon: Irine Seal, MD;  [facility];  Service: Urology;  Laterality: N/A;  REQUESTING 1 HR FOR CASE   EYE SURGERY     right cataract  surgery with lens implant   TESTICLE SURGERY Right 2011   hydrocele   TRANSURETHRAL RESECTION OF PROSTATE N/A 10/24/2020   Procedure: TRANSURETHRAL RESECTION OF THE PROSTATE (TURP);  Surgeon: Irine Seal, MD;  [facility];  Service: Urology;  Laterality: N/A;    FAMILY HISTORY: No family history on file.  SOCIAL HISTORY: Social History   Socioeconomic History   Marital status: Married    Spouse name: Not on file   Number of children: Not on file   Years of education: Not on file   Highest education level: Not on file  Occupational History   Not on file  Tobacco Use   Smoking status: Former    Packs/day: 1.50    Years: 30.00    Total pack years: 45.00    Types: Cigarettes    Quit date: 07/30/1995    Years since quitting: 26.8   Smokeless tobacco: Never  Vaping Use   Vaping Use: Never used  Substance and Sexual Activity   Alcohol use: No   Drug use: No   Sexual activity: Not on file  Other Topics Concern   Not on file  Social History Narrative   Not on file   Social Determinants of Health   Financial Resource Strain: Not on file  Food Insecurity: No Food Insecurity (05/09/2022)   Hunger Vital Sign    Worried About Running Out of Food in the Last Year: Never true    [place] in the Last Year: Never true  Transportation Needs: No Transportation Needs (05/09/2022)   PRAPARE - Hydrologist (Medical): No    Lack of Transportation (Non-Medical): No  Physical Activity: Not on file  Stress: Not on file  Social Connections: Not on file  Intimate Partner Violence: Not on file      Marcial Pacas, M.D. Ph.D.  [facility] [address], [place], [place] [address] Ph: [phone] Fax: [phone]  CC:  Lavone Orn, MD [address] [place],  [place] [address]  Lavone Orn, MD

## 2022-05-29 NOTE — Procedures (Signed)
Full Name: Brian Levine Gender: Male MRN #: [id number] Date of Birth: 12/09/1945    Visit Date: 05/29/2022 08:12 Age: 76 Years Examining Physician: Dr. Marcial Pacas Referring Physician: Dr. Marcial Pacas Height: 5 feet 11 inch History: 76yo male with multiple myeloma, presented with ascending bilateral lower extremity paresthesia over past few month, no hands involvment  Summary of test: NCS: Right sural, superficial peroneal sensory responses were absent. Right ulnar and radial sensory responses showed slight decreased SNAP amplitude.  Right tibial, ulnar motor responses were normal. Right peroneal to EDB showed mildly decreased CMP amplitude.  EMG: Selected needle examination of bilateral lower extremity muscles, bilateral paraspinal muscles showed no significant abnormality.  Mild decreased recruitment patterns in distal leg muscles, no spontaneous activity.     Conclusion: This is an abnormal study.  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    ------------------------------- Marcial Pacas M.D. Ph.D.  [facility] [address], [place], [place] [address] Tel: [phone] Fax: [phone]  Verbal informed consent was obtained from the patient, patient was informed of potential risk of procedure, including bruising, bleeding, hematoma formation, infection, muscle weakness, muscle pain, numbness, among others.        [place]    Nerve / Sites Muscle Latency Ref. Amplitude Ref. Rel Amp Segments Distance Velocity Ref. Area    ms ms mV mV %  cm m/s m/s mVms  R Ulnar - ADM     Wrist ADM 3.4 ?3.3 10.5 ?6.0 100 Wrist - ADM 7   32.8     B.Elbow ADM 6.2  10.0  95 B.Elbow - Wrist 16 57 ?49 31.6     A.Elbow ADM 9.9  9.1  91.3 A.Elbow - B.Elbow 17 46 ?49 32.1  R Peroneal - EDB     Ankle EDB 5.6 ?6.5 1.5 ?2.0 100 Ankle - EDB 9   3.8     Fib head EDB 13.3  0.8  50.2 Fib head - Ankle 29 38 ?44 1.8     Pop fossa EDB  15.4  2.0  256 Pop fossa - Fib head 8 37 ?44 8.7         Pop fossa - Ankle      R Tibial - AH     Ankle AH 5.6 ?5.8 8.2 ?4.0 100 Ankle - AH 9   16.0     Pop fossa AH 17.7  5.0  61.4 Pop fossa - Ankle 46.5 38 ?41 13.2           SNC    Nerve / Sites Rec. Site Peak Lat Ref.  Amp Ref. Segments Distance    ms ms V V  cm  R Radial - Anatomical snuff box (Forearm)     Forearm Wrist 2.9 ?2.9 11 ?15 Forearm - Wrist 10  R Sural - Ankle (Calf)     Calf Ankle NR ?4.4 NR ?6 Calf - Ankle 14  R Superficial peroneal - Ankle     Lat leg Ankle NR ?4.4 NR ?6 Lat leg - Ankle 14  R Ulnar - Orthodromic, (Dig V, Mid palm)     Dig V Wrist 3.1 ?3.1 3 ?5 Dig V - Wrist 30             F  Wave    Nerve F Lat Ref.   ms ms  R Ulnar - ADM 33.0 ?32.0  R Tibial - AH 27.4 ?56.0  R Median - APB 28.1 ?  31.0  R Tibial - AH NR ?56.0  R Tibial - AH 56.9 ?56.0             EMG Summary Table    Spontaneous MUAP Recruitment  Muscle IA Fib PSW Fasc Other Amp Dur. Poly Pattern  R. Tibialis anterior Normal None None None _______ Normal Normal Normal Reduced  R. Tibialis posterior Normal None None None _______ Normal Normal Normal Reduced  R. Peroneus longus Normal None None None _______ Normal Normal Normal Reduced  R. Gastrocnemius (Medial head) Normal None None None _______ Normal Normal Normal Normal  R. Vastus lateralis Normal None None None _______ Normal Normal Normal Normal  L. Tibialis anterior Normal None None None _______ Normal Normal Normal Normal  L. Tibialis posterior Normal None None None _______ Normal Normal Normal Reduced  L. Peroneus longus Normal None None None _______ Normal Normal Normal Reduced  L. Gastrocnemius (Medial head) Normal None None None _______ Normal Normal Normal Reduced  L. Vastus lateralis Normal None None None _______ Normal Normal Normal Normal  R. Lumbar paraspinals (low) Normal None None None _______ Normal Normal Normal Normal  R. Lumbar paraspinals (mid) Normal None None None  _______ Normal Normal Normal Normal  L. Lumbar paraspinals (low) Normal None None None _______ Normal Normal Normal Normal  L. Lumbar paraspinals (mid) Normal None None None _______ Normal Normal Normal Normal

## 2022-05-30 LAB — VITAMIN D 25 HYDROXY (VIT D DEFICIENCY, FRACTURES): Vit D, 25-Hydroxy: 57.8 ng/mL (ref 30.0–100.0)

## 2022-05-30 LAB — TSH: TSH: 2.72 u[IU]/mL (ref 0.450–4.500)

## 2022-05-30 LAB — RPR: RPR Ser Ql: NONREACTIVE

## 2022-05-30 LAB — HGB A1C W/O EAG: Hgb A1c MFr Bld: 5.1 % (ref 4.8–5.6)

## 2022-05-30 LAB — ANA W/REFLEX IF POSITIVE: Anti Nuclear Antibody (ANA): NEGATIVE

## 2022-05-30 LAB — VITAMIN B12: Vitamin B-12: 599 pg/mL (ref 232–1245)

## 2022-06-01 NOTE — Progress Notes (Signed)
EMG report is under procedure 

## 2022-06-04 ENCOUNTER — Ambulatory Visit: Payer: Medicare HMO

## 2022-06-06 ENCOUNTER — Other Ambulatory Visit: Payer: Self-pay | Admitting: Hematology

## 2022-06-06 DIAGNOSIS — C9000 Multiple myeloma not having achieved remission: Secondary | ICD-10-CM

## 2022-06-10 ENCOUNTER — Encounter: Payer: Self-pay | Admitting: Hematology

## 2022-06-10 ENCOUNTER — Other Ambulatory Visit: Payer: Self-pay

## 2022-07-02 ENCOUNTER — Inpatient Hospital Stay: Payer: Medicare HMO

## 2022-07-02 ENCOUNTER — Other Ambulatory Visit: Payer: Self-pay

## 2022-07-02 ENCOUNTER — Inpatient Hospital Stay: Payer: Medicare HMO | Attending: Hematology

## 2022-07-02 ENCOUNTER — Inpatient Hospital Stay: Payer: Medicare HMO | Admitting: Hematology

## 2022-07-02 ENCOUNTER — Other Ambulatory Visit: Payer: Self-pay | Admitting: Hematology

## 2022-07-02 VITALS — BP 122/68 | HR 60 | Temp 98.7°F | Resp 17 | Ht 71.0 in | Wt 187.8 lb

## 2022-07-02 DIAGNOSIS — Z87891 Personal history of nicotine dependence: Secondary | ICD-10-CM | POA: Insufficient documentation

## 2022-07-02 DIAGNOSIS — C9000 Multiple myeloma not having achieved remission: Secondary | ICD-10-CM | POA: Diagnosis not present

## 2022-07-02 DIAGNOSIS — D709 Neutropenia, unspecified: Secondary | ICD-10-CM | POA: Insufficient documentation

## 2022-07-02 DIAGNOSIS — G629 Polyneuropathy, unspecified: Secondary | ICD-10-CM | POA: Insufficient documentation

## 2022-07-02 DIAGNOSIS — N182 Chronic kidney disease, stage 2 (mild): Secondary | ICD-10-CM | POA: Diagnosis not present

## 2022-07-02 DIAGNOSIS — Z5111 Encounter for antineoplastic chemotherapy: Secondary | ICD-10-CM

## 2022-07-02 LAB — CMP (CANCER CENTER ONLY)
ALT: 19 U/L (ref 0–44)
AST: 21 U/L (ref 15–41)
Albumin: 3.9 g/dL (ref 3.5–5.0)
Alkaline Phosphatase: 55 U/L (ref 38–126)
Anion gap: 7 (ref 5–15)
BUN: 10 mg/dL (ref 8–23)
CO2: 26 mmol/L (ref 22–32)
Calcium: 9.7 mg/dL (ref 8.9–10.3)
Chloride: 105 mmol/L (ref 98–111)
Creatinine: 1.42 mg/dL — ABNORMAL HIGH (ref 0.61–1.24)
GFR, Estimated: 51 mL/min — ABNORMAL LOW (ref >60)
Glucose, Bld: 117 mg/dL — ABNORMAL HIGH (ref 70–99)
Potassium: 3.8 mmol/L (ref 3.5–5.1)
Sodium: 138 mmol/L (ref 135–145)
Total Bilirubin: 1.2 mg/dL (ref 0.3–1.2)
Total Protein: 6.8 g/dL (ref 6.5–8.1)

## 2022-07-02 LAB — CBC WITH DIFFERENTIAL (CANCER CENTER ONLY)
Abs Immature Granulocytes: 0 10*3/uL (ref 0.00–0.07)
Basophils Absolute: 0 10*3/uL (ref 0.0–0.1)
Basophils Relative: 1 %
Eosinophils Absolute: 0.1 10*3/uL (ref 0.0–0.5)
Eosinophils Relative: 6 %
HCT: 33.9 % — ABNORMAL LOW (ref 39.0–52.0)
Hemoglobin: 11.9 g/dL — ABNORMAL LOW (ref 13.0–17.0)
Immature Granulocytes: 0 %
Lymphocytes Relative: 45 %
Lymphs Abs: 1 10*3/uL (ref 0.7–4.0)
MCH: 30.3 pg (ref 26.0–34.0)
MCHC: 35.1 g/dL (ref 30.0–36.0)
MCV: 86.3 fL (ref 80.0–100.0)
Monocytes Absolute: 0.1 10*3/uL (ref 0.1–1.0)
Monocytes Relative: 5 %
Neutro Abs: 1 10*3/uL — ABNORMAL LOW (ref 1.7–7.7)
Neutrophils Relative %: 43 %
Platelet Count: 100 10*3/uL — ABNORMAL LOW (ref 150–400)
RBC: 3.93 MIL/uL — ABNORMAL LOW (ref 4.22–5.81)
RDW: 13.8 % (ref 11.5–15.5)
WBC Count: 2.3 10*3/uL — ABNORMAL LOW (ref 4.0–10.5)
nRBC: 0 % (ref 0.0–0.2)

## 2022-07-02 MED ORDER — ZOLEDRONIC ACID 4 MG/100ML IV SOLN
4.0000 mg | Freq: Once | INTRAVENOUS | Status: AC
  Administered 2022-07-02: 4 mg via INTRAVENOUS
  Filled 2022-07-02: qty 100

## 2022-07-02 MED ORDER — SODIUM CHLORIDE 0.9 % IV SOLN: Freq: Once | INTRAVENOUS | Status: AC

## 2022-07-02 NOTE — Patient Instructions (Signed)

## 2022-07-02 NOTE — Progress Notes (Signed)
HEMATOLOGY/ONCOLOGY CLINIC NOTE  DOS.07/02/2022   Patient Care Team: Lavone Orn, MD as PCP - General (Internal Medicine)  CHIEF COMPLAINTS/PURPOSE OF VISIT:  Follow-up for continued evaluation and management of multiple myeloma  HISTORY OF PRESENTING ILLNESS:  Please see previous notes for details on initial presentation.  INTERVAL HISTORY  Patient is here for continued evaluation and management of his multiple myeloma and neck cycle of daratumumab carfilzomib dexamethasone chemotherapy.  At his last visit with me on 05/07/2022, he was doing well overall with no medical concerns.  Patient reports he has been doing well without any new medical concerns since our last visit. He notes that the lower Revlimid dose is helping him with fatigue and is tolerating it well.   He still complains of bilateral leg numbness. He has been to the Neurologist who did not notice any abnormalities. He notes he had nerve conduction study, which showed pinched nerves near lower back and neuropathy. He is not currently taking any medication for the neuropathy. He describe the pain as uncomfortable. He reports he notices his neuropathy when he is seating and lying down, but denies it impacting his sleep or affecting his balance.   Patient complains of occasional back pain, especially when he is laying down. He notes that walking helps improve his back pain. Patient has been lifting weights, which could potentially cause his back pain.   He denies fever, chills, new infection issues, or leg swelling.   Patient has received the influenza vaccine, but denies COVID-19 booster and the RSV vaccine.   MEDICAL HISTORY:  Past Medical History:  Diagnosis Date   BPH (benign prostatic hyperplasia) 2004   Constipation    Other pancytopenia ([place]) 05/04/2013   Thrombocytopenia, unspecified ([place]) 08/17/2013   Weight loss 05/04/2013    SURGICAL HISTORY: Past Surgical History:  Procedure Laterality Date    APPENDECTOMY     COLONOSCOPY WITH PROPOFOL N/A 07/17/2015   Procedure: COLONOSCOPY WITH PROPOFOL;  Surgeon: Garlan Fair, MD;  Location: [facility];  Service: Endoscopy;  Laterality: N/A;   CYSTOSCOPY WITH FULGERATION N/A 11/08/2020   Procedure: CYSTOSCOPY WITH FULGERATION OF BLEEDERS;  Surgeon: Irine Seal, MD;  Location: [facility];  Service: Urology;  Laterality: N/A;   CYSTOSCOPY WITH FULGERATION N/A 12/01/2020   Procedure: CYSTOSCOPY WITH FULGERATION OF  PROSTATE;  Surgeon: Irine Seal, MD;  Location: [facility];  Service: Urology;  Laterality: N/A;   CYSTOSCOPY WITH INSERTION OF UROLIFT N/A 08/22/2015   Procedure: CYSTOSCOPY WITH INSERTION OF UROLIFT;  Surgeon: Irine Seal, MD;  Location: [facility];  Service: Urology;  Laterality: N/A;   CYSTOSCOPY WITH LITHOLAPAXY N/A 10/24/2020   Procedure: CYSTOSCOPY WITH LITHOLAPAXY;  Surgeon: Irine Seal, MD;  Location: [facility];  Service: Urology;  Laterality: N/A;  REQUESTING 1 HR FOR CASE   EYE SURGERY     right cataract surgery with lens implant   TESTICLE SURGERY Right 2011   hydrocele   TRANSURETHRAL RESECTION OF PROSTATE N/A 10/24/2020   Procedure: TRANSURETHRAL RESECTION OF THE PROSTATE (TURP);  Surgeon: Irine Seal, MD;  Location: [facility];  Service: Urology;  Laterality: N/A;    SOCIAL HISTORY: Social History   Socioeconomic History   Marital status: Married    Spouse name: Not on file   Number of children: Not on file   Years of education: Not on file   Highest education level: Not on file  Occupational History   Not on file  Tobacco Use   Smoking status: Former    Packs/day:  1.50    Years: 30.00    Total pack years: 45.00    Types: Cigarettes    Quit date: 07/30/1995    Years since quitting: 26.9   Smokeless tobacco: Never  Vaping Use   Vaping Use: Never used  Substance and Sexual Activity   Alcohol use: No   Drug use: No   Sexual activity: Not on file  Other Topics Concern   Not on file  Social History Narrative   Not on file    Social Determinants of Health   Financial Resource Strain: Not on file  Food Insecurity: No Food Insecurity (05/09/2022)   Hunger Vital Sign    Worried About Running Out of Food in the Last Year: Never true    Ran Out of Food in the Last Year: Never true  Transportation Needs: No Transportation Needs (05/09/2022)   PRAPARE - Hydrologist (Medical): No    Lack of Transportation (Non-Medical): No  Physical Activity: Not on file  Stress: Not on file  Social Connections: Not on file  Intimate Partner Violence: Not on file    FAMILY HISTORY: No family history on file.  ALLERGIES:  has No Known Allergies.  MEDICATIONS:  Current Outpatient Medications  Medication Sig Dispense Refill   acetaminophen (TYLENOL) 325 MG tablet Take 650 mg by mouth every 6 (six) hours as needed (for pain.).     aspirin EC 81 MG tablet Take 1 tablet (81 mg total) by mouth daily. Swallow whole. 30 tablet 12   b complex vitamins capsule Take 1 capsule by mouth daily. 30 capsule 11   calcium carbonate (TUMS - DOSED IN MG ELEMENTAL CALCIUM) 500 MG chewable tablet Chew 1 tablet by mouth daily.     cholecalciferol (VITAMIN D3) 25 MCG (1000 UNIT) tablet Take 5,000 Units by mouth daily.     doxazosin (CARDURA) 8 MG tablet Take 8 mg by mouth daily in the afternoon.     finasteride (PROSCAR) 5 MG tablet Take 5 mg by mouth daily in the afternoon.     lenalidomide (REVLIMID) 5 MG capsule TAKE 1 CAPSULE BY MOUTH EVERY DAY ON DAYS 1-21 THEN TAKE 7 DAYS OFF 21 capsule 0   pravastatin (PRAVACHOL) 40 MG tablet Take 40 mg by mouth daily before supper.     No current facility-administered medications for this visit.    REVIEW OF SYSTEMS:   10 Point review of Systems was done is negative except as noted above.  PHYSICAL EXAMINATION: ECOG PERFORMANCE STATUS: 1 - Symptomatic but completely ambulatory  Vitals:   07/02/22 1302  BP: 122/68  Pulse: 60  Resp: 17  Temp: 98.7 F (37.1 C)   SpO2: 99%     Filed Weights   07/02/22 1302  Weight: 187 lb 12.8 oz (85.2 kg)   NAD GENERAL:alert, in no acute distress and comfortable SKIN: no acute rashes, no significant lesions EYES: conjunctiva are pink and non-injected, sclera anicteric OROPHARYNX: MMM, no exudates, no oropharyngeal erythema or ulceration NECK: supple, no JVD LYMPH:  no palpable lymphadenopathy in the cervical, axillary or inguinal regions LUNGS: clear to auscultation b/l with normal respiratory effort HEART: regular rate & rhythm ABDOMEN:  normoactive bowel sounds , non tender, not distended. Extremity: no pedal edema PSYCH: alert & oriented x 3 with fluent speech NEURO: no focal motor/sensory deficits    LABORATORY DATA:      Latest Ref Rng & Units 07/02/2022   12:11 PM 05/07/2022    1:17 PM  04/07/2022    3:45 PM  CBC  WBC 4.0 - 10.5 K/uL 2.3  2.4  1.9   Hemoglobin 13.0 - 17.0 g/dL 11.9  11.6  11.0   Hematocrit 39.0 - 52.0 % 33.9  33.0  32.6   Platelets 150 - 400 K/uL 100  77  78   ANC of 1100     Latest Ref Rng & Units 07/02/2022   12:11 PM 05/07/2022    1:17 PM 04/07/2022    3:45 PM  CMP  Glucose 70 - 99 mg/dL 117  110  114   BUN 8 - 23 mg/dL _0 Creatinine 0.61 - 1.24 mg/dL 1.42  1.59  1.49   Sodium 135 - 145 mmol/L 138  138  136   Potassium 3.5 - 5.1 mmol/L 3.8  3.7  3.5   Chloride 98 - 111 mmol/L 105  105  105   CO2 22 - 32 mmol/L _1 Calcium 8.9 - 10.3 mg/dL 9.7  9.2  8.6   Total Protein 6.5 - 8.1 g/dL 6.8  7.0  7.0   Total Bilirubin 0.3 - 1.2 mg/dL 1.2  0.9  1.5   Alkaline Phos 38 - 126 U/L 55  52  56   AST 15 - 41 U/L 21  21  49   ALT 0 - 44 U/L 19  20  42     Myeloma labs on 09/26/2021 within normal limits  ,not observed M protein   Component     Latest Ref Rng & Units 08/28/2021  IgG (Immunoglobin G), Serum     603 - 1,613 mg/dL 906  IgA     61 - 437 mg/dL 218  IgM (Immunoglobulin M), Srm     15 - 143 mg/dL 28  Total Protein ELP     6.0 - 8.5 g/dL  6.5  Albumin SerPl Elph-Mcnc     2.9 - 4.4 g/dL 3.8  Alpha 1     0.0 - 0.4 g/dL 0.2  Alpha2 Glob SerPl Elph-Mcnc     0.4 - 1.0 g/dL 0.7  B-Globulin SerPl Elph-Mcnc     0.7 - 1.3 g/dL 0.8  Gamma Glob SerPl Elph-Mcnc     0.4 - 1.8 g/dL 0.9  M Protein SerPl Elph-Mcnc     Not Observed g/dL Not Observed  Globulin, Total     2.2 - 3.9 g/dL 2.7  Albumin/Glob SerPl     0.7 - 1.7 1.5  IFE 1      Comment  Please Note (HCV):      Comment  Kappa free light chain     3.3 - 19.4 mg/L 39.2 (H)  Lambda free light chains     5.7 - 26.3 mg/L 28.7 (H)  Kappa, lambda light chain ratio     0.26 - 1.65 1.37   05/18/2021: PET SCAN Interval resolution of the hypermetabolism seen previously in the posterior left iliac bone. 2. Tiny hypermetabolic focus in the region of the left adrenal gland without nodule or mass lesion evident on CT imaging. Close follow-up recommended. 3. Otherwise no new or suspicious hypermetabolism on today's study. 4.  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 Prostatomegaly  06/01/2021: Bone Marrow Biopsy: Surgical Pathology  CASE: [id number]  PATIENT: Brian Levine  Bone Marrow Report      Clinical History: History of multiple myeloma , (Alpha)   DIAGNOSIS:   BONE MARROW, ASPIRATE, CLOT, CORE:  -Hypercellular bone marrow for age  with trilineage hematopoiesis and 1%  plasma cells  -See comment   PERIPHERAL BLOOD:  -Pancytopenia   COMMENT:   The bone marrow is hypercellular with trilineage hematopoiesis and nonspecific myeloid changes likely related to therapy.  In this  background, the plasma cells represent 1% of all cells in the aspirate associated with scattering of interstitial cells in the clot and biopsy sections with lack of large aggregates or sheets. The plasma cells display polyclonal staining pattern for kappa and lambda light chains and hence, there is no definitive or diagnostic evidence of residual plasma cell neoplasm.  Correlation with  cytogenetic and FISH studies is recommended.   CLOT AND BIOPSY: The sections show cellularity ranging from 20 to 70%  with a mixture of myeloid cell types.  Large clusters or sheets of plasma cells are not identified.  Significant lymphoid aggregates are  not seen.  Immunohistochemical stain for CD138 and in situ hybridization  for kappa and lambda were performed on blocks B1 and C1 with appropriate  controls.  CD138 highlights an extremely minor plasma cell component mostly consisting of scattering of interstitial cells and displays  polyclonal staining pattern for kappa and lambda light chains.  Bone Marrow count performed on 500 cells shows:  Blasts:   0%   Myeloid:  39%  Promyelocytes: 0%   Erythroid:     52%  Myelocytes:    6%   Lymphocytes:   6%  Metamyelocytes:     3%   Plasma cells:  1%  Bands:    11%  Neutrophils:   14%  M:E ratio:     0.75  Eosinophils:   5%  Basophils:     0%  Monocytes:     2%   Lab Data: CBC performed on 06/01/21 shows:  WBC: 2.2 k/uL  Neutrophils:   56%  Hgb: 10.4 g/dL Lymphocytes:   26%  HCT: 31.0 %    Monocytes:     12%  MCV: 94.8 fL   Eosinophils:   4%  RDW: 14.2 %    Basophils:     2%  PLT: 135 k/uL     ASSESSMENT & PLAN:   76 yo with    1) Recurrent/persistent hematuria bleeding after prostate surgery. - resolved No fhx or previous personal h/o bleeding issues.  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  6) Neuropathy.     PLAN: -Discussed lab results from 07/02/2022 with the patient. CBC showed WBC of 2.3 K, hemoglobin of 11.9 K, hematocrit of 33.9 K, and platelets of 100. CMP shows creatine of 1.42.  -Continue the same Revelimid dosage. 21m 3w on 1w off. -Discussed the two medication option for neuropathy: 1. Cymbalta. 2. Gabapentin. Discussed the pros and cons of the two medications. -Patient does not want to start neuropathy medications right now.  -Recommended COVID-19 Booster and RSV vaccine.  -No other acute new symptoms since his last clinic visit.. -No new focal bone pains or adverse localized signs  on clinical examination. -RTC with Dr Irene Limbo with labs and appointment for zometa in 8 weeks   FOLLOW UP:  RTC with Dr Irene Limbo with labs and appointment for zometa in 8 weeks  The total time spent in the appointment was 20 minutes* .  All of the patient's questions were answered with apparent satisfaction. The patient knows to call the clinic with any problems, questions or concerns.   Sullivan Lone MD MS AAHIVMS [facility] Hematology/Oncology Physician [facility]  .*Total Encounter Time as defined by the Centers for Medicare and Medicaid Services includes, in addition to the face-to-face time of a patient visit (documented in the note above) non-face-to-face time: obtaining and reviewing outside history, ordering and reviewing medications, tests or procedures, care coordination (communications with other health care professionals or caregivers) and documentation in the medical record.   I, Cleda Mccreedy, am acting as a Education administrator for Sullivan Lone, MD. .I have reviewed the above documentation for accuracy and completeness, and I agree with the above.  Brunetta Genera MD

## 2022-07-02 NOTE — Progress Notes (Signed)
Patient seen by MD today  Vitals are within treatment parameters.  Labs reviewed: ,"and are not all within treatment parameters. Dr Irene Limbo aware of all CBC and CMP values  Per physician team, patient is ready for treatment and there are NO modifications to the treatment plan.

## 2022-07-03 LAB — KAPPA/LAMBDA LIGHT CHAINS
Kappa free light chain: 58.8 mg/L — ABNORMAL HIGH (ref 3.3–19.4)
Kappa, lambda light chain ratio: 1.62 (ref 0.26–1.65)
Lambda free light chains: 36.4 mg/L — ABNORMAL HIGH (ref 5.7–26.3)

## 2022-07-08 ENCOUNTER — Encounter: Payer: Self-pay | Admitting: Hematology

## 2022-07-08 ENCOUNTER — Other Ambulatory Visit: Payer: Self-pay

## 2022-07-08 DIAGNOSIS — C9000 Multiple myeloma not having achieved remission: Secondary | ICD-10-CM

## 2022-07-08 MED ORDER — LENALIDOMIDE 5 MG PO CAPS: ORAL_CAPSULE | ORAL | 0 refills | Status: DC

## 2022-07-09 LAB — MULTIPLE MYELOMA PANEL, SERUM
Albumin SerPl Elph-Mcnc: 3.8 g/dL (ref 2.9–4.4)
Albumin/Glob SerPl: 1.4 (ref 0.7–1.7)
Alpha 1: 0.2 g/dL (ref 0.0–0.4)
Alpha2 Glob SerPl Elph-Mcnc: 0.6 g/dL (ref 0.4–1.0)
B-Globulin SerPl Elph-Mcnc: 0.8 g/dL (ref 0.7–1.3)
Gamma Glob SerPl Elph-Mcnc: 1.2 g/dL (ref 0.4–1.8)
Globulin, Total: 2.9 g/dL (ref 2.2–3.9)
IgA: 338 mg/dL (ref 61–437)
IgG (Immunoglobin G), Serum: 1161 mg/dL (ref 603–1613)
IgM (Immunoglobulin M), Srm: 29 mg/dL (ref 15–143)
Total Protein ELP: 6.7 g/dL (ref 6.0–8.5)

## 2022-07-31 ENCOUNTER — Other Ambulatory Visit: Payer: Self-pay | Admitting: Hematology

## 2022-07-31 DIAGNOSIS — C9000 Multiple myeloma not having achieved remission: Secondary | ICD-10-CM

## 2022-08-01 ENCOUNTER — Encounter: Payer: Self-pay | Admitting: Hematology

## 2022-08-01 ENCOUNTER — Other Ambulatory Visit: Payer: Self-pay

## 2022-08-26 ENCOUNTER — Other Ambulatory Visit: Payer: Self-pay | Admitting: *Deleted

## 2022-08-26 DIAGNOSIS — C9000 Multiple myeloma not having achieved remission: Secondary | ICD-10-CM

## 2022-08-27 ENCOUNTER — Inpatient Hospital Stay: Payer: Medicare HMO

## 2022-08-27 ENCOUNTER — Other Ambulatory Visit: Payer: Self-pay

## 2022-08-27 ENCOUNTER — Inpatient Hospital Stay: Payer: Medicare HMO | Attending: Hematology

## 2022-08-27 ENCOUNTER — Inpatient Hospital Stay: Payer: Medicare HMO | Admitting: Hematology

## 2022-08-27 VITALS — BP 121/60 | HR 60 | Temp 97.6°F | Resp 16 | Wt 190.7 lb

## 2022-08-27 DIAGNOSIS — G629 Polyneuropathy, unspecified: Secondary | ICD-10-CM | POA: Insufficient documentation

## 2022-08-27 DIAGNOSIS — Z79899 Other long term (current) drug therapy: Secondary | ICD-10-CM | POA: Insufficient documentation

## 2022-08-27 DIAGNOSIS — D709 Neutropenia, unspecified: Secondary | ICD-10-CM | POA: Insufficient documentation

## 2022-08-27 DIAGNOSIS — C9000 Multiple myeloma not having achieved remission: Secondary | ICD-10-CM

## 2022-08-27 DIAGNOSIS — Z87891 Personal history of nicotine dependence: Secondary | ICD-10-CM | POA: Diagnosis not present

## 2022-08-27 LAB — CBC WITH DIFFERENTIAL (CANCER CENTER ONLY)
Abs Immature Granulocytes: 0 10*3/uL (ref 0.00–0.07)
Basophils Absolute: 0 10*3/uL (ref 0.0–0.1)
Basophils Relative: 1 %
Eosinophils Absolute: 0.1 10*3/uL (ref 0.0–0.5)
Eosinophils Relative: 5 %
HCT: 35 % — ABNORMAL LOW (ref 39.0–52.0)
Hemoglobin: 12.2 g/dL — ABNORMAL LOW (ref 13.0–17.0)
Immature Granulocytes: 0 %
Lymphocytes Relative: 50 %
Lymphs Abs: 1.2 10*3/uL (ref 0.7–4.0)
MCH: 29.6 pg (ref 26.0–34.0)
MCHC: 34.9 g/dL (ref 30.0–36.0)
MCV: 85 fL (ref 80.0–100.0)
Monocytes Absolute: 0.2 10*3/uL (ref 0.1–1.0)
Monocytes Relative: 7 %
Neutro Abs: 0.9 10*3/uL — ABNORMAL LOW (ref 1.7–7.7)
Neutrophils Relative %: 37 %
Platelet Count: 97 10*3/uL — ABNORMAL LOW (ref 150–400)
RBC: 4.12 MIL/uL — ABNORMAL LOW (ref 4.22–5.81)
RDW: 13.8 % (ref 11.5–15.5)
WBC Count: 2.4 10*3/uL — ABNORMAL LOW (ref 4.0–10.5)
nRBC: 0 % (ref 0.0–0.2)

## 2022-08-27 LAB — CMP (CANCER CENTER ONLY)
ALT: 19 U/L (ref 0–44)
AST: 20 U/L (ref 15–41)
Albumin: 3.8 g/dL (ref 3.5–5.0)
Alkaline Phosphatase: 49 U/L (ref 38–126)
Anion gap: 7 (ref 5–15)
BUN: 10 mg/dL (ref 8–23)
CO2: 27 mmol/L (ref 22–32)
Calcium: 9.2 mg/dL (ref 8.9–10.3)
Chloride: 105 mmol/L (ref 98–111)
Creatinine: 1.46 mg/dL — ABNORMAL HIGH (ref 0.61–1.24)
GFR, Estimated: 50 mL/min — ABNORMAL LOW (ref >60)
Glucose, Bld: 92 mg/dL (ref 70–99)
Potassium: 3.8 mmol/L (ref 3.5–5.1)
Sodium: 139 mmol/L (ref 135–145)
Total Bilirubin: 1 mg/dL (ref 0.3–1.2)
Total Protein: 6.9 g/dL (ref 6.5–8.1)

## 2022-08-27 MED ORDER — SODIUM CHLORIDE 0.9 % IV SOLN: INTRAVENOUS | Status: DC

## 2022-08-27 MED ORDER — ZOLEDRONIC ACID 4 MG/100ML IV SOLN
4.0000 mg | Freq: Once | INTRAVENOUS | Status: AC
  Administered 2022-08-27: 4 mg via INTRAVENOUS
  Filled 2022-08-27: qty 100

## 2022-08-27 NOTE — Patient Instructions (Signed)

## 2022-08-27 NOTE — Progress Notes (Signed)
Patient seen by MD today  Vitals are within treatment parameters.  Labs reviewed: ,"and are not all within treatment parameters. Dr Irene Limbo aware [place]: 0.9 , CR 1.46  Per physician team, patient is ready for treatment and there are NO modifications to the treatment plan. Zometa is to be given every 8 weeks, next due date is 09/02/22 per Dr Irene Limbo pt may get today.

## 2022-08-27 NOTE — Progress Notes (Signed)
HEMATOLOGY/ONCOLOGY CLINIC NOTE  DOS. 08/27/22    Patient Care Team: Lavone Orn, MD as PCP - General (Internal Medicine)  CHIEF COMPLAINTS/PURPOSE OF VISIT:  Follow-up for continued evaluation and management of multiple myeloma  HISTORY OF PRESENTING ILLNESS:  Please see previous notes for details on initial presentation.  INTERVAL HISTORY  Patient is here for continued evaluation and management of his multiple myeloma. Patient was last seen by me on 07/02/2022 and he was doing well overall.   Patient reports he has been doing well overall without any new medical concerns since our last visit. He does complain of occasional lower back pain.   He denies fever, chills, night sweats, new infection issues, unexpected weight loss, bone pain, chest pain, abdominal pain, or leg swelling.   Patient has been taking Revlimid 5 mg with no notable severe toxicities. He does report of occasional hand numbness.   He has received influenza vaccine, but not COVID-19 Booster or RSV vaccine.   MEDICAL HISTORY:  Past Medical History:  Diagnosis Date   BPH (benign prostatic hyperplasia) 2004   Constipation    Other pancytopenia ([place]) 05/04/2013   Thrombocytopenia, unspecified ([place]) 08/17/2013   Weight loss 05/04/2013    SURGICAL HISTORY: Past Surgical History:  Procedure Laterality Date   APPENDECTOMY     COLONOSCOPY WITH PROPOFOL N/A 07/17/2015   Procedure: COLONOSCOPY WITH PROPOFOL;  Surgeon: Garlan Fair, MD;  Location: [facility];  Service: Endoscopy;  Laterality: N/A;   CYSTOSCOPY WITH FULGERATION N/A 11/08/2020   Procedure: CYSTOSCOPY WITH FULGERATION OF BLEEDERS;  Surgeon: Irine Seal, MD;  Location: [facility];  Service: Urology;  Laterality: N/A;   CYSTOSCOPY WITH FULGERATION N/A 12/01/2020   Procedure: CYSTOSCOPY WITH FULGERATION OF  PROSTATE;  Surgeon: Irine Seal, MD;  Location: [facility];  Service: Urology;  Laterality: N/A;   CYSTOSCOPY WITH INSERTION OF UROLIFT N/A  08/22/2015   Procedure: CYSTOSCOPY WITH INSERTION OF UROLIFT;  Surgeon: Irine Seal, MD;  [facility];  Service: Urology;  Laterality: N/A;   CYSTOSCOPY WITH LITHOLAPAXY N/A 10/24/2020   Procedure: CYSTOSCOPY WITH LITHOLAPAXY;  Surgeon: Irine Seal, MD;  Location[facility];  Service: Urology;  Laterality: N/A;  REQUESTING 1 HR FOR CASE   EYE SURGERY     right cataract surgery with lens implant   TESTICLE SURGERY Right 2011   hydrocele   TRANSURETHRAL RESECTION OF PROSTATE N/A 10/24/2020   Procedure: TRANSURETHRAL RESECTION OF THE PROSTATE (TURP);  Surgeon: Irine Seal, MD;  [facility];  Service: Urology;  Laterality: N/A;    SOCIAL HISTORY: Social History   Socioeconomic History   Marital status: Married    Spouse name: Not on file   Number of children: Not on file   Years of education: Not on file   Highest education level: Not on file  Occupational History   Not on file  Tobacco Use   Smoking status: Former    Packs/day: 1.50    Years: 30.00    Total pack years: 45.00    Types: Cigarettes    Quit date: 07/30/1995    Years since quitting: 27.0   Smokeless tobacco: Never  Vaping Use   Vaping Use: Never used  Substance and Sexual Activity   Alcohol use: No   Drug use: No   Sexual activity: Not on file  Other Topics Concern   Not on file  Social History Narrative   Not on file   Social Determinants of Health   Financial Resource Strain: Not on  file  Food Insecurity: No Food Insecurity (05/09/2022)   Hunger Vital Sign    Worried About Running Out of Food in the Last Year: Never true    Ran Out of Food in the Last Year: Never true  Transportation Needs: No Transportation Needs (05/09/2022)   PRAPARE - Hydrologist (Medical): No    Lack of Transportation (Non-Medical): No  Physical Activity: Not on file  Stress: Not on file  Social Connections: Not on file  Intimate Partner Violence: Not on file    FAMILY HISTORY: No family  history on file.  ALLERGIES:  has No Known Allergies.  MEDICATIONS:  Current Outpatient Medications  Medication Sig Dispense Refill   acetaminophen (TYLENOL) 325 MG tablet Take 650 mg by mouth every 6 (six) hours as needed (for pain.).     aspirin EC 81 MG tablet Take 1 tablet (81 mg total) by mouth daily. Swallow whole. 30 tablet 12   b complex vitamins capsule Take 1 capsule by mouth daily. 30 capsule 11   calcium carbonate (TUMS - DOSED IN MG ELEMENTAL CALCIUM) 500 MG chewable tablet Chew 1 tablet by mouth daily.     cholecalciferol (VITAMIN D3) 25 MCG (1000 UNIT) tablet Take 5,000 Units by mouth daily.     doxazosin (CARDURA) 8 MG tablet Take 8 mg by mouth daily in the afternoon.     finasteride (PROSCAR) 5 MG tablet Take 5 mg by mouth daily in the afternoon.     lenalidomide (REVLIMID) 5 MG capsule TAKE 1 CAPSULE BY MOUTH EVERY DAY ON DAYS 1-21 THEN TAKE 7 DAYS OFF 21 capsule 0   pravastatin (PRAVACHOL) 40 MG tablet Take 40 mg by mouth daily before supper.     No current facility-administered medications for this visit.    REVIEW OF SYSTEMS:   10 Point review of Systems was done is negative except as noted above.  PHYSICAL EXAMINATION: ECOG PERFORMANCE STATUS: 1 - Symptomatic but completely ambulatory  Vitals:   08/27/22 1316  BP: 121/60  Pulse: 60  Resp: 16  Temp: 97.6 F (36.4 C)  SpO2: 100%   Filed Weights   08/27/22 1316  Weight: 190 lb 11.2 oz (86.5 kg)    NAD GENERAL:alert, in no acute distress and comfortable SKIN: no acute rashes, no significant lesions EYES: conjunctiva are pink and non-injected, sclera anicteric OROPHARYNX: MMM, no exudates, no oropharyngeal erythema or ulceration NECK: supple, no JVD LYMPH:  no palpable lymphadenopathy in the cervical, axillary or inguinal regions LUNGS: clear to auscultation b/l with normal respiratory effort HEART: regular rate & rhythm ABDOMEN:  normoactive bowel sounds , non tender, not distended. Extremity: no  pedal edema PSYCH: alert & oriented x 3 with fluent speech NEURO: no focal motor/sensory deficits    LABORATORY DATA:      Latest Ref Rng & Units 08/27/2022   12:57 PM 07/02/2022   12:11 PM 05/07/2022    1:17 PM  CBC  WBC 4.0 - 10.5 K/uL 2.4  2.3  2.4   Hemoglobin 13.0 - 17.0 g/dL 12.2  11.9  11.6   Hematocrit 39.0 - 52.0 % 35.0  33.9  33.0   Platelets 150 - 400 K/uL 97  100  77        Latest Ref Rng & Units 08/27/2022   12:57 PM 07/02/2022   12:11 PM 05/07/2022    1:17 PM  CMP  Glucose 70 - 99 mg/dL 92  117  110   BUN 8 -  23 mg/dL '10  10  9   '$ Creatinine 0.61 - 1.24 mg/dL 1.46  1.42  1.59   Sodium 135 - 145 mmol/L 139  138  138   Potassium 3.5 - 5.1 mmol/L 3.8  3.8  3.7   Chloride 98 - 111 mmol/L 105  105  105   CO2 22 - 32 mmol/L '27  26  28   '$ Calcium 8.9 - 10.3 mg/dL 9.2  9.7  9.2   Total Protein 6.5 - 8.1 g/dL 6.9  6.8  7.0   Total Bilirubin 0.3 - 1.2 mg/dL 1.0  1.2  0.9   Alkaline Phos 38 - 126 U/L 49  55  52   AST 15 - 41 U/L '20  21  21   '$ ALT 0 - 44 U/L '19  19  20     '$ Myeloma labs on 09/26/2021 within normal limits  ,not observed M protein   Component     Latest Ref Rng & Units 08/28/2021  IgG (Immunoglobin G), Serum     603 - 1,613 mg/dL 906  IgA     61 - 437 mg/dL 218  IgM (Immunoglobulin M), Srm     15 - 143 mg/dL 28  Total Protein ELP     6.0 - 8.5 g/dL 6.5  Albumin SerPl Elph-Mcnc     2.9 - 4.4 g/dL 3.8  Alpha 1     0.0 - 0.4 g/dL 0.2  Alpha2 Glob SerPl Elph-Mcnc     0.4 - 1.0 g/dL 0.7  B-Globulin SerPl Elph-Mcnc     0.7 - 1.3 g/dL 0.8  Gamma Glob SerPl Elph-Mcnc     0.4 - 1.8 g/dL 0.9  M Protein SerPl Elph-Mcnc     Not Observed g/dL Not Observed  Globulin, Total     2.2 - 3.9 g/dL 2.7  Albumin/Glob SerPl     0.7 - 1.7 1.5  IFE 1      Comment  Please Note (HCV):      Comment  Kappa free light chain     3.3 - 19.4 mg/L 39.2 (H)  Lambda free light chains     5.7 - 26.3 mg/L 28.7 (H)  Kappa, lambda light chain ratio     0.26 - 1.65 1.37    05/18/2021: PET SCAN Interval resolution of the hypermetabolism seen previously in the posterior left iliac bone. 2. Tiny hypermetabolic focus in the region of the left adrenal gland without nodule or mass lesion evident on CT imaging. Close follow-up recommended. 3. Otherwise no new or suspicious hypermetabolism on today's study. 4.  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 Prostatomegaly  06/01/2021: Bone Marrow Biopsy: Surgical Pathology  CASE: [id number]  PATIENT: Edith Hostler  Bone Marrow Report      Clinical History: History of multiple myeloma , (BH)   DIAGNOSIS:   BONE MARROW, ASPIRATE, CLOT, CORE:  -Hypercellular bone marrow for age with trilineage hematopoiesis and 1%  plasma cells  -See comment   PERIPHERAL BLOOD:  -Pancytopenia   COMMENT:   The bone marrow is hypercellular with trilineage hematopoiesis and nonspecific myeloid changes likely related to therapy.  In this  background, the plasma cells represent 1% of all cells in the aspirate associated with scattering of interstitial cells in the clot and biopsy sections with lack of large aggregates or sheets. The plasma cells display polyclonal staining pattern for kappa and lambda light chains and hence, there is no definitive or diagnostic evidence of residual plasma cell neoplasm.  Correlation with cytogenetic  and FISH studies is recommended.   CLOT AND BIOPSY: The sections show cellularity ranging from 20 to 70%  with a mixture of myeloid cell types.  Large clusters or sheets of plasma cells are not identified.  Significant lymphoid aggregates are  not seen.  Immunohistochemical stain for CD138 and in situ hybridization  for kappa and lambda were performed on blocks B1 and C1 with appropriate  controls.  CD138 highlights an extremely minor plasma cell component mostly consisting of scattering of interstitial cells and displays  polyclonal staining pattern for kappa and lambda light chains.  Bone Marrow  count performed on 500 cells shows:  Blasts:   0%   Myeloid:  39%  Promyelocytes: 0%   Erythroid:     52%  Myelocytes:    6%   Lymphocytes:   6%  Metamyelocytes:     3%   Plasma cells:  1%  Bands:    11%  Neutrophils:   14%  M:E ratio:     0.75  Eosinophils:   5%  Basophils:     0%  Monocytes:     2%   Lab Data: CBC performed on 06/01/21 shows:  WBC: 2.2 k/uL  Neutrophils:   56%  Hgb: 10.4 g/dL Lymphocytes:   26%  HCT: 31.0 %    Monocytes:     12%  MCV: 94.8 fL   Eosinophils:   4%  RDW: 14.2 %    Basophils:     2%  PLT: 135 k/uL     ASSESSMENT & PLAN:   77 yo with    1) Recurrent/persistent hematuria bleeding after prostate surgery. - resolved No fhx or previous personal h/o bleeding issues.  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  6) Neuropathy.     PLAN: -Discussed lab results from today, 08/27/2022, with the patient. CBC shows WBC pf 2.4, hemoglobin of 12.2, hematocrit of 35.0, and platelets of 97. CMP shows elevated creatinine of 1.46. -recommended COVID-19 Booster and RSV vaccine.  -recommended staying hydrated, around 2 L. -Patient is tolerating Revlimid 5 mg with no severe toxicities except occasional bilateral hand numbness.  -Continue the same Revelimid dosage. '5mg'$  3w on 1w off. -No other acute new symptoms since his last clinic visit.  -Continue Zometa infusion.   FOLLOW UP:  RTC with Dr Irene Limbo with  labs and appointment for zometa in 8 weeks.  The total time spent in the appointment was 21 minutes* .  All of the patient's questions were answered with apparent satisfaction. The patient knows to call the clinic with any problems, questions or concerns.   Sullivan Lone MD MS AAHIVMS [facility] Hematology/Oncology Physician [facility]  .*Total Encounter Time as defined by the Centers for Medicare and Medicaid Services includes, in addition to the face-to-face time of a patient visit (documented in the note above) non-face-to-face time: obtaining and reviewing outside history, ordering and reviewing medications, tests or procedures, care coordination (communications with other health care  professionals or caregivers) and documentation in the medical record.  I, Cleda Mccreedy, am acting as a Education administrator for Sullivan Lone, MD. .I have reviewed the above documentation for accuracy and completeness, and I agree with the above. Brunetta Genera MD

## 2022-08-28 ENCOUNTER — Other Ambulatory Visit: Payer: Self-pay | Admitting: Hematology

## 2022-08-28 DIAGNOSIS — C9000 Multiple myeloma not having achieved remission: Secondary | ICD-10-CM

## 2022-08-28 LAB — KAPPA/LAMBDA LIGHT CHAINS
Kappa free light chain: 59.7 mg/L — ABNORMAL HIGH (ref 3.3–19.4)
Kappa, lambda light chain ratio: 1.58 (ref 0.26–1.65)
Lambda free light chains: 37.8 mg/L — ABNORMAL HIGH (ref 5.7–26.3)

## 2022-08-29 ENCOUNTER — Encounter: Payer: Self-pay | Admitting: Hematology

## 2022-08-29 ENCOUNTER — Other Ambulatory Visit: Payer: Self-pay

## 2022-09-02 ENCOUNTER — Encounter: Payer: Self-pay | Admitting: Hematology

## 2022-09-02 LAB — MULTIPLE MYELOMA PANEL, SERUM
Albumin SerPl Elph-Mcnc: 3.7 g/dL (ref 2.9–4.4)
Albumin/Glob SerPl: 1.3 (ref 0.7–1.7)
Alpha 1: 0.2 g/dL (ref 0.0–0.4)
Alpha2 Glob SerPl Elph-Mcnc: 0.6 g/dL (ref 0.4–1.0)
B-Globulin SerPl Elph-Mcnc: 0.9 g/dL (ref 0.7–1.3)
Gamma Glob SerPl Elph-Mcnc: 1.2 g/dL (ref 0.4–1.8)
Globulin, Total: 3 g/dL (ref 2.2–3.9)
IgA: 355 mg/dL (ref 61–437)
IgG (Immunoglobin G), Serum: 1236 mg/dL (ref 603–1613)
IgM (Immunoglobulin M), Srm: 26 mg/dL (ref 15–143)
Total Protein ELP: 6.7 g/dL (ref 6.0–8.5)

## 2022-09-30 ENCOUNTER — Other Ambulatory Visit: Payer: Self-pay | Admitting: Hematology

## 2022-09-30 DIAGNOSIS — C9000 Multiple myeloma not having achieved remission: Secondary | ICD-10-CM

## 2022-10-02 ENCOUNTER — Encounter: Payer: Self-pay | Admitting: Hematology

## 2022-10-02 ENCOUNTER — Other Ambulatory Visit: Payer: Self-pay

## 2022-10-21 ENCOUNTER — Other Ambulatory Visit: Payer: Self-pay | Admitting: Physician Assistant

## 2022-10-21 DIAGNOSIS — C9000 Multiple myeloma not having achieved remission: Secondary | ICD-10-CM

## 2022-10-22 ENCOUNTER — Inpatient Hospital Stay: Payer: Medicare HMO | Admitting: Physician Assistant

## 2022-10-22 ENCOUNTER — Inpatient Hospital Stay: Payer: Medicare HMO | Attending: Hematology

## 2022-10-22 ENCOUNTER — Inpatient Hospital Stay: Payer: Medicare HMO

## 2022-10-22 VITALS — BP 131/54 | HR 56 | Temp 97.7°F | Resp 13 | Wt 192.0 lb

## 2022-10-22 VITALS — BP 118/59 | HR 50 | Temp 97.9°F | Resp 16

## 2022-10-22 DIAGNOSIS — C9000 Multiple myeloma not having achieved remission: Secondary | ICD-10-CM | POA: Diagnosis not present

## 2022-10-22 DIAGNOSIS — Z7982 Long term (current) use of aspirin: Secondary | ICD-10-CM | POA: Diagnosis not present

## 2022-10-22 DIAGNOSIS — Z87891 Personal history of nicotine dependence: Secondary | ICD-10-CM | POA: Diagnosis not present

## 2022-10-22 DIAGNOSIS — D61818 Other pancytopenia: Secondary | ICD-10-CM | POA: Diagnosis not present

## 2022-10-22 DIAGNOSIS — Z79899 Other long term (current) drug therapy: Secondary | ICD-10-CM | POA: Diagnosis not present

## 2022-10-22 LAB — CMP (CANCER CENTER ONLY)
ALT: 19 U/L (ref 0–44)
AST: 20 U/L (ref 15–41)
Albumin: 3.8 g/dL (ref 3.5–5.0)
Alkaline Phosphatase: 46 U/L (ref 38–126)
Anion gap: 7 (ref 5–15)
BUN: 10 mg/dL (ref 8–23)
CO2: 26 mmol/L (ref 22–32)
Calcium: 8.9 mg/dL (ref 8.9–10.3)
Chloride: 104 mmol/L (ref 98–111)
Creatinine: 1.46 mg/dL — ABNORMAL HIGH (ref 0.61–1.24)
GFR, Estimated: 50 mL/min — ABNORMAL LOW (ref >60)
Glucose, Bld: 112 mg/dL — ABNORMAL HIGH (ref 70–99)
Potassium: 3.8 mmol/L (ref 3.5–5.1)
Sodium: 137 mmol/L (ref 135–145)
Total Bilirubin: 1.1 mg/dL (ref 0.3–1.2)
Total Protein: 6.6 g/dL (ref 6.5–8.1)

## 2022-10-22 LAB — CBC WITH DIFFERENTIAL (CANCER CENTER ONLY)
Abs Immature Granulocytes: 0 10*3/uL (ref 0.00–0.07)
Basophils Absolute: 0 10*3/uL (ref 0.0–0.1)
Basophils Relative: 1 %
Eosinophils Absolute: 0.1 10*3/uL (ref 0.0–0.5)
Eosinophils Relative: 4 %
HCT: 34.7 % — ABNORMAL LOW (ref 39.0–52.0)
Hemoglobin: 12.2 g/dL — ABNORMAL LOW (ref 13.0–17.0)
Immature Granulocytes: 0 %
Lymphocytes Relative: 48 %
Lymphs Abs: 1.1 10*3/uL (ref 0.7–4.0)
MCH: 29.8 pg (ref 26.0–34.0)
MCHC: 35.2 g/dL (ref 30.0–36.0)
MCV: 84.8 fL (ref 80.0–100.0)
Monocytes Absolute: 0.1 10*3/uL (ref 0.1–1.0)
Monocytes Relative: 6 %
Neutro Abs: 1 10*3/uL — ABNORMAL LOW (ref 1.7–7.7)
Neutrophils Relative %: 41 %
Platelet Count: 92 10*3/uL — ABNORMAL LOW (ref 150–400)
RBC: 4.09 MIL/uL — ABNORMAL LOW (ref 4.22–5.81)
RDW: 14.5 % (ref 11.5–15.5)
WBC Count: 2.3 10*3/uL — ABNORMAL LOW (ref 4.0–10.5)
nRBC: 0 % (ref 0.0–0.2)

## 2022-10-22 MED ORDER — ZOLEDRONIC ACID 4 MG/100ML IV SOLN
4.0000 mg | Freq: Once | INTRAVENOUS | Status: AC
  Administered 2022-10-22: 4 mg via INTRAVENOUS
  Filled 2022-10-22: qty 100

## 2022-10-22 MED ORDER — SODIUM CHLORIDE 0.9 % IV SOLN: Freq: Once | INTRAVENOUS | Status: AC

## 2022-10-22 NOTE — Patient Instructions (Signed)

## 2022-10-23 ENCOUNTER — Encounter: Payer: Self-pay | Admitting: Hematology

## 2022-10-23 ENCOUNTER — Other Ambulatory Visit: Payer: Self-pay | Admitting: Hematology

## 2022-10-23 DIAGNOSIS — C9000 Multiple myeloma not having achieved remission: Secondary | ICD-10-CM

## 2022-10-23 NOTE — Progress Notes (Signed)
HEMATOLOGY/ONCOLOGY CLINIC NOTE  DOS. 10/23/22    Patient Care Team: Lavone Orn, MD as PCP - General (Internal Medicine)  CHIEF COMPLAINTS/PURPOSE OF VISIT:  Follow-up for continued evaluation and management of multiple myeloma  HISTORY OF PRESENTING ILLNESS:  Please see previous notes for details on initial presentation.  INTERVAL HISTORY Brian Levine is a 77 y.o. male returns for a follow up for continued evaluation and management of his multiple myeloma.He was last seen by Dr. Vivianne Carles Limbo on 08/27/2022. He presents unaccompanied for this visit.   Brian Levine reports he is doing well and is tolerating Revlimid therapy. He has a good appetite and stable energy levels. He denies any nause,a vomiting or abdominal pain. His bowel habits are unchanged without recurrent episodes of diarrhea or constipation. He denies easy bruising or signs of bleeding. He denies fevers, chills, sweats, shortness of breath, chest pain, cough, bone pain, peripheral edema or neuropathy. He has no other complaints .   MEDICAL HISTORY:  Past Medical History:  Diagnosis Date   BPH (benign prostatic hyperplasia) 2004   Constipation    Other pancytopenia ([place]) 05/04/2013   Thrombocytopenia, unspecified ([place]) 08/17/2013   Weight loss 05/04/2013    SURGICAL HISTORY: Past Surgical History:  Procedure Laterality Date   APPENDECTOMY     COLONOSCOPY WITH PROPOFOL N/A 07/17/2015   Procedure: COLONOSCOPY WITH PROPOFOL;  Surgeon: Garlan Fair, MD;  [facility];  Service: Endoscopy;  Laterality: N/A;   CYSTOSCOPY WITH FULGERATION N/A 11/08/2020   Procedure: CYSTOSCOPY WITH FULGERATION OF BLEEDERS;  Surgeon: Irine Seal, MD;  [facility];  Service: Urology;  Laterality: N/A;   CYSTOSCOPY WITH FULGERATION N/A 12/01/2020   Procedure: CYSTOSCOPY WITH FULGERATION OF  PROSTATE;  Surgeon: Irine Seal, MD;  Location[facility];  Service: Urology;  Laterality: N/A;   CYSTOSCOPY WITH INSERTION OF UROLIFT N/A 08/22/2015    Procedure: CYSTOSCOPY WITH INSERTION OF UROLIFT;  Surgeon: Irine Seal, MD;  [facility];  Service: Urology;  Laterality: N/A;   CYSTOSCOPY WITH LITHOLAPAXY N/A 10/24/2020   Procedure: CYSTOSCOPY WITH LITHOLAPAXY;  Surgeon: Irine Seal, MD;  Location: [facility];  Service: Urology;  Laterality: N/A;  REQUESTING 1 HR FOR CASE   EYE SURGERY     right cataract surgery with lens implant   TESTICLE SURGERY Right 2011   hydrocele   TRANSURETHRAL RESECTION OF PROSTATE N/A 10/24/2020   Procedure: TRANSURETHRAL RESECTION OF THE PROSTATE (TURP);  Surgeon: Irine Seal, MD;  Location[facility];  Service: Urology;  Laterality: N/A;    SOCIAL HISTORY: Social History   Socioeconomic History   Marital status: Married    Spouse name: Not on file   Number of children: Not on file   Years of education: Not on file   Highest education level: Not on file  Occupational History   Not on file  Tobacco Use   Smoking status: Former    Packs/day: 1.50    Years: 30.00    Additional pack years: 0.00    Total pack years: 45.00    Types: Cigarettes    Quit date: 07/30/1995    Years since quitting: 27.2   Smokeless tobacco: Never  Vaping Use   Vaping Use: Never used  Substance and Sexual Activity   Alcohol use: No   Drug use: No   Sexual activity: Not on file  Other Topics Concern   Not on file  Social History Narrative   Not on file   Social Determinants of Health  Financial Resource Strain: Not on file  Food Insecurity: No Food Insecurity (05/09/2022)   Hunger Vital Sign    Worried About Running Out of Food in the Last Year: Never true    Ran Out of Food in the Last Year: Never true  Transportation Needs: No Transportation Needs (05/09/2022)   PRAPARE - Hydrologist (Medical): No    Lack of Transportation (Non-Medical): No  Physical Activity: Not on file  Stress: Not on file  Social Connections: Not on file  Intimate Partner Violence: Not on file    FAMILY  HISTORY: No family history on file.  ALLERGIES:  has No Known Allergies.  MEDICATIONS:  Current Outpatient Medications  Medication Sig Dispense Refill   acetaminophen (TYLENOL) 325 MG tablet Take 650 mg by mouth every 6 (six) hours as needed (for pain.).     aspirin EC 81 MG tablet Take 1 tablet (81 mg total) by mouth daily. Swallow whole. 30 tablet 12   b complex vitamins capsule Take 1 capsule by mouth daily. 30 capsule 11   calcium carbonate (TUMS - DOSED IN MG ELEMENTAL CALCIUM) 500 MG chewable tablet Chew 1 tablet by mouth daily.     cholecalciferol (VITAMIN D3) 25 MCG (1000 UNIT) tablet Take 5,000 Units by mouth daily.     doxazosin (CARDURA) 8 MG tablet Take 8 mg by mouth daily in the afternoon.     finasteride (PROSCAR) 5 MG tablet Take 5 mg by mouth daily in the afternoon.     lenalidomide (REVLIMID) 5 MG capsule TAKE 1 CAPSULE BY MOUTH EVERY DAY ON DAYS 1-21 THEN TAKE 7 DAYS OFF 21 capsule 0   pravastatin (PRAVACHOL) 40 MG tablet Take 40 mg by mouth daily before supper.     No current facility-administered medications for this visit.    REVIEW OF SYSTEMS:   10 Point review of Systems was done is negative except as noted above.  PHYSICAL EXAMINATION: ECOG PERFORMANCE STATUS: 1 - Symptomatic but completely ambulatory  Vitals:   10/22/22 1456  BP: (!) 131/54  Pulse: (!) 56  Resp: 13  Temp: 97.7 F (36.5 C)  SpO2: 100%   Filed Weights   10/22/22 1456  Weight: 192 lb (87.1 kg)    NAD GENERAL:alert, in no acute distress and comfortable SKIN: no acute rashes, no significant lesions EYES: conjunctiva are pink and non-injected, sclera anicteric OROPHARYNX: MMM, no exudates, no oropharyngeal erythema or ulceration NECK: supple, no JVD LYMPH:  no palpable lymphadenopathy in the cervical or supraclavicular regions LUNGS: clear to auscultation b/l with normal respiratory effort HEART: regular rate & rhythm Extremity: no pedal edema PSYCH: alert & oriented x 3 with  fluent speech NEURO: no focal motor/sensory deficits    LABORATORY DATA:      Latest Ref Rng & Units 10/22/2022    2:31 PM 08/27/2022   12:57 PM 07/02/2022   12:11 PM  CBC  WBC 4.0 - 10.5 K/uL 2.3  2.4  2.3   Hemoglobin 13.0 - 17.0 g/dL 12.2  12.2  11.9   Hematocrit 39.0 - 52.0 % 34.7  35.0  33.9   Platelets 150 - 400 K/uL 92  97  100        Latest Ref Rng & Units 10/22/2022    2:31 PM 08/27/2022   12:57 PM 07/02/2022   12:11 PM  CMP  Glucose 70 - 99 mg/dL 112  92  117   BUN 8 - 23 mg/dL 10  10  10   Creatinine 0.61 - 1.24 mg/dL 1.46  1.46  1.42   Sodium 135 - 145 mmol/L 137  139  138   Potassium 3.5 - 5.1 mmol/L 3.8  3.8  3.8   Chloride 98 - 111 mmol/L 104  105  105   CO2 22 - 32 mmol/L 26  27  26    Calcium 8.9 - 10.3 mg/dL 8.9  9.2  9.7   Total Protein 6.5 - 8.1 g/dL 6.6  6.9  6.8   Total Bilirubin 0.3 - 1.2 mg/dL 1.1  1.0  1.2   Alkaline Phos 38 - 126 U/L 46  49  55   AST 15 - 41 U/L 20  20  21    ALT 0 - 44 U/L 19  19  19        06/01/2021: Bone Marrow Biopsy: Surgical Pathology  CASE: [id number]  PATIENT: Eligah Gendron  Bone Marrow Report      Clinical History: History of multiple myeloma , (BH)   DIAGNOSIS:   BONE MARROW, ASPIRATE, CLOT, CORE:  -Hypercellular bone marrow for age with trilineage hematopoiesis and 1%  plasma cells  -See comment   PERIPHERAL BLOOD:  -Pancytopenia   COMMENT:   The bone marrow is hypercellular with trilineage hematopoiesis and nonspecific myeloid changes likely related to therapy.  In this  background, the plasma cells represent 1% of all cells in the aspirate associated with scattering of interstitial cells in the clot and biopsy sections with lack of large aggregates or sheets. The plasma cells display polyclonal staining pattern for kappa and lambda light chains and hence, there is no definitive or diagnostic evidence of residual plasma cell neoplasm.  Correlation with cytogenetic and FISH studies is recommended.   CLOT  AND BIOPSY: The sections show cellularity ranging from 20 to 70%  with a mixture of myeloid cell types.  Large clusters or sheets of plasma cells are not identified.  Significant lymphoid aggregates are  not seen.  Immunohistochemical stain for CD138 and in situ hybridization  for kappa and lambda were performed on blocks B1 and C1 with appropriate  controls.  CD138 highlights an extremely minor plasma cell component mostly consisting of scattering of interstitial cells and displays  polyclonal staining pattern for kappa and lambda light chains.  Bone Marrow count performed on 500 cells shows:  Blasts:   0%   Myeloid:  39%  Promyelocytes: 0%   Erythroid:     52%  Myelocytes:    6%   Lymphocytes:   6%  Metamyelocytes:     3%   Plasma cells:  1%  Bands:    11%  Neutrophils:   14%  M:E ratio:     0.75  Eosinophils:   5%  Basophils:     0%  Monocytes:     2%   Lab Data: CBC performed on 06/01/21 shows:  WBC: 2.2 k/uL  Neutrophils:   56%  Hgb: 10.4 g/dL Lymphocytes:   26%  HCT: 31.0 %    Monocytes:     12%  MCV: 94.8 fL   Eosinophils:   4%  RDW: 14.2 %    Basophils:     2%  PLT: 135 k/uL     ASSESSMENT & PLAN:  Brian Levine is a 77 y.o. male who returns for a follow up for lambda light chain multiple myeloma.  #Lambda Light Chain Multiple Myeloma -Bone marrow biopsy showed 90% plasma cell involvement. Cytogenetics showed Monosomy 13, 1p deletion, 1q  duplication -Received Kyprolis, Revlimid plus Dexamethasone therapy from 01/30/2021-05/16/2021. Patient decided against transplant and transitioned to maintenance Revlimid 15 mg therapy 3, weeks on and 1 week off.  Decreased Revlimid to 5 mg daily 3 weeks and 1 week off in August 2023 due to muscle cramps and pancytopenia.   PLAN: -Presents today for a follow up.  -Labs from today reviewed and adequate for treatment. Has stable pancytopenia with WBC 2.3, Hgb 12.2, Plt 92K. Creatinine is overall stable at 1.46. Calcium levels normal.  Myeloma labs pending today. -Most recent SPEP from 08/27/2022 did not detect an M-protein. Lambda light chain levels overall stable at 37.8, kappa light chains 59.7, normal ratio -Recommend to continue on Revlimid 5 mg PO daily, 3 weeks on and 1 week off. Continue with Zometa infusion every 8 weeks.   FOLLOW UP:  RTC with Dr Kirstein Baxley Limbo with labs and appointment for zometa in 8 weeks.  All of the patient's questions were answered with apparent satisfaction. The patient knows to call the clinic with any problems, questions or concerns.  I have spent a total of 25 minutes minutes of face-to-face and non-face-to-face time, preparing to see the patient, performing a medically appropriate examination, counseling and educating the patient, documenting clinical information in the electronic health record,and care coordination.   Dede Query PA-C Dept of Hematology and [place] at [facility] Phone: [phone]

## 2022-10-24 ENCOUNTER — Other Ambulatory Visit: Payer: Self-pay

## 2022-10-24 ENCOUNTER — Encounter: Payer: Self-pay | Admitting: Hematology

## 2022-10-29 LAB — MULTIPLE MYELOMA PANEL, SERUM
Albumin SerPl Elph-Mcnc: 3.6 g/dL (ref 2.9–4.4)
Albumin/Glob SerPl: 1.3 (ref 0.7–1.7)
Alpha 1: 0.2 g/dL (ref 0.0–0.4)
Alpha2 Glob SerPl Elph-Mcnc: 0.6 g/dL (ref 0.4–1.0)
B-Globulin SerPl Elph-Mcnc: 0.9 g/dL (ref 0.7–1.3)
Gamma Glob SerPl Elph-Mcnc: 1.1 g/dL (ref 0.4–1.8)
Globulin, Total: 2.8 g/dL (ref 2.2–3.9)
IgA: 334 mg/dL (ref 61–437)
IgG (Immunoglobin G), Serum: 1164 mg/dL (ref 603–1613)
IgM (Immunoglobulin M), Srm: 25 mg/dL (ref 15–143)
Total Protein ELP: 6.4 g/dL (ref 6.0–8.5)

## 2022-11-13 ENCOUNTER — Other Ambulatory Visit: Payer: Self-pay

## 2022-11-13 DIAGNOSIS — C9000 Multiple myeloma not having achieved remission: Secondary | ICD-10-CM

## 2022-11-14 ENCOUNTER — Inpatient Hospital Stay: Payer: Medicare HMO | Attending: Hematology

## 2022-11-14 ENCOUNTER — Other Ambulatory Visit: Payer: Self-pay

## 2022-11-14 DIAGNOSIS — C9000 Multiple myeloma not having achieved remission: Secondary | ICD-10-CM | POA: Insufficient documentation

## 2022-11-15 LAB — KAPPA/LAMBDA LIGHT CHAINS
Kappa free light chain: 50 mg/L — ABNORMAL HIGH (ref 3.3–19.4)
Kappa, lambda light chain ratio: 1.4 (ref 0.26–1.65)
Lambda free light chains: 35.6 mg/L — ABNORMAL HIGH (ref 5.7–26.3)

## 2022-11-17 ENCOUNTER — Other Ambulatory Visit: Payer: Self-pay | Admitting: Hematology

## 2022-11-17 DIAGNOSIS — C9000 Multiple myeloma not having achieved remission: Secondary | ICD-10-CM

## 2022-11-18 ENCOUNTER — Other Ambulatory Visit: Payer: Self-pay

## 2022-11-18 ENCOUNTER — Encounter: Payer: Self-pay | Admitting: Hematology

## 2022-12-18 DIAGNOSIS — C9000 Multiple myeloma not having achieved remission: Secondary | ICD-10-CM | POA: Diagnosis not present

## 2022-12-18 DIAGNOSIS — N400 Enlarged prostate without lower urinary tract symptoms: Secondary | ICD-10-CM | POA: Diagnosis not present

## 2022-12-18 DIAGNOSIS — E7800 Pure hypercholesterolemia, unspecified: Secondary | ICD-10-CM | POA: Diagnosis not present

## 2022-12-22 ENCOUNTER — Other Ambulatory Visit: Payer: Self-pay | Admitting: Hematology

## 2022-12-22 DIAGNOSIS — C9000 Multiple myeloma not having achieved remission: Secondary | ICD-10-CM

## 2022-12-24 ENCOUNTER — Other Ambulatory Visit: Payer: Self-pay

## 2022-12-24 ENCOUNTER — Encounter: Payer: Self-pay | Admitting: Hematology

## 2022-12-24 DIAGNOSIS — C9000 Multiple myeloma not having achieved remission: Secondary | ICD-10-CM

## 2022-12-25 ENCOUNTER — Inpatient Hospital Stay: Payer: Medicare HMO | Admitting: Hematology

## 2022-12-25 ENCOUNTER — Inpatient Hospital Stay: Payer: Medicare HMO

## 2022-12-25 ENCOUNTER — Other Ambulatory Visit: Payer: Self-pay

## 2022-12-25 ENCOUNTER — Inpatient Hospital Stay: Payer: Medicare HMO | Attending: Hematology

## 2022-12-25 VITALS — BP 133/59 | HR 57 | Temp 98.1°F | Resp 17 | Ht 71.0 in | Wt 187.6 lb

## 2022-12-25 DIAGNOSIS — D61818 Other pancytopenia: Secondary | ICD-10-CM | POA: Diagnosis not present

## 2022-12-25 DIAGNOSIS — C9000 Multiple myeloma not having achieved remission: Secondary | ICD-10-CM | POA: Diagnosis not present

## 2022-12-25 LAB — CMP (CANCER CENTER ONLY)
ALT: 22 U/L (ref 0–44)
AST: 21 U/L (ref 15–41)
Albumin: 3.9 g/dL (ref 3.5–5.0)
Alkaline Phosphatase: 49 U/L (ref 38–126)
Anion gap: 6 (ref 5–15)
BUN: 12 mg/dL (ref 8–23)
CO2: 26 mmol/L (ref 22–32)
Calcium: 8.8 mg/dL — ABNORMAL LOW (ref 8.9–10.3)
Chloride: 105 mmol/L (ref 98–111)
Creatinine: 1.41 mg/dL — ABNORMAL HIGH (ref 0.61–1.24)
GFR, Estimated: 51 mL/min — ABNORMAL LOW (ref >60)
Glucose, Bld: 117 mg/dL — ABNORMAL HIGH (ref 70–99)
Potassium: 3.6 mmol/L (ref 3.5–5.1)
Sodium: 137 mmol/L (ref 135–145)
Total Bilirubin: 1.2 mg/dL (ref 0.3–1.2)
Total Protein: 6.9 g/dL (ref 6.5–8.1)

## 2022-12-25 LAB — CBC WITH DIFFERENTIAL (CANCER CENTER ONLY)
Abs Immature Granulocytes: 0.01 10*3/uL (ref 0.00–0.07)
Basophils Absolute: 0 10*3/uL (ref 0.0–0.1)
Basophils Relative: 1 %
Eosinophils Absolute: 0.1 10*3/uL (ref 0.0–0.5)
Eosinophils Relative: 5 %
HCT: 35 % — ABNORMAL LOW (ref 39.0–52.0)
Hemoglobin: 12 g/dL — ABNORMAL LOW (ref 13.0–17.0)
Immature Granulocytes: 0 %
Lymphocytes Relative: 43 %
Lymphs Abs: 1.1 10*3/uL (ref 0.7–4.0)
MCH: 29.1 pg (ref 26.0–34.0)
MCHC: 34.3 g/dL (ref 30.0–36.0)
MCV: 85 fL (ref 80.0–100.0)
Monocytes Absolute: 0.3 10*3/uL (ref 0.1–1.0)
Monocytes Relative: 10 %
Neutro Abs: 1.1 10*3/uL — ABNORMAL LOW (ref 1.7–7.7)
Neutrophils Relative %: 41 %
Platelet Count: 72 10*3/uL — ABNORMAL LOW (ref 150–400)
RBC: 4.12 MIL/uL — ABNORMAL LOW (ref 4.22–5.81)
RDW: 14.3 % (ref 11.5–15.5)
WBC Count: 2.6 10*3/uL — ABNORMAL LOW (ref 4.0–10.5)
nRBC: 0 % (ref 0.0–0.2)

## 2022-12-25 MED ORDER — SODIUM CHLORIDE 0.9 % IV SOLN: INTRAVENOUS | Status: DC

## 2022-12-25 MED ORDER — ZOLEDRONIC ACID 4 MG/100ML IV SOLN
4.0000 mg | Freq: Once | INTRAVENOUS | Status: AC
  Administered 2022-12-25: 4 mg via INTRAVENOUS
  Filled 2022-12-25: qty 100

## 2022-12-25 NOTE — Progress Notes (Signed)
Patient seen by Dr. Addison Naegeli are within treatment parameters.  Labs reviewed: and are not all within treatment parameters. Dr Candise Che aware ANC: 1.1 , Plts :72, CR: 1.41  Per physician team, patient is ready for treatment and there are NO modifications to the treatment plan.

## 2022-12-25 NOTE — Progress Notes (Signed)
HEMATOLOGY/ONCOLOGY CLINIC NOTE  DOS. 08/27/22  Patient Care Team: [name], MD as PCP - General (Internal Medicine)  CHIEF COMPLAINTS/PURPOSE OF VISIT:  Follow-up for continued evaluation and management of multiple myeloma  HISTORY OF PRESENTING ILLNESS:  Please see previous notes for details on initial presentation.  INTERVAL HISTORY  Brian Levine is a 77 y.o. male here for continued evaluation and management of his multiple myeloma. Patient was last seen by me on 08/27/2022 and complained of occasional lower back pain and occasional hand numbness.   Patient was most recently seen by [facility] on 10/22/2022 and was doing well overall with no new medical concerns.   Today, he reports that he is currently in the last week of his Revlimid cycle. He notes that his white blood counts and platelets have been low for the past 8-10 years. The reason for this had not been determined.   Patient has been taking Revlimid 5 mg with no notable severe toxicities. Patient denies any diarrhea, muscle cramps, infection issues, abdominal pain, or new bone pain. He endorses stable energy levels, p.o intake, and weight.  MEDICAL HISTORY:  Past Medical History:  Diagnosis Date   BPH (benign prostatic hyperplasia) 2004   Constipation    Other pancytopenia (HCC) 05/04/2013   Thrombocytopenia, unspecified (HCC) 08/17/2013   Weight loss 05/04/2013    SURGICAL HISTORY: Past Surgical History:  Procedure Laterality Date   APPENDECTOMY     COLONOSCOPY WITH PROPOFOL N/A 07/17/2015   Procedure: COLONOSCOPY WITH PROPOFOL;  Surgeon: Charolett Bumpers, MD;  Location: [facility];  Service: Endoscopy;  Laterality: N/A;   CYSTOSCOPY WITH FULGERATION N/A 11/08/2020   Procedure: CYSTOSCOPY WITH FULGERATION OF BLEEDERS;  Surgeon: Bjorn Pippin, MD;  Location: [facility];  Service: Urology;  Laterality: N/A;   CYSTOSCOPY WITH FULGERATION N/A 12/01/2020   Procedure: CYSTOSCOPY WITH FULGERATION OF  PROSTATE;  Surgeon:  Bjorn Pippin, MD;  Location: [facility];  Service: Urology;  Laterality: N/A;   CYSTOSCOPY WITH INSERTION OF UROLIFT N/A 08/22/2015   Procedure: CYSTOSCOPY WITH INSERTION OF UROLIFT;  Surgeon: Bjorn Pippin, MD;  Location: [facility];  Service: Urology;  Laterality: N/A;   CYSTOSCOPY WITH LITHOLAPAXY N/A 10/24/2020   Procedure: CYSTOSCOPY WITH LITHOLAPAXY;  Surgeon: Bjorn Pippin, MD;  Location: [facility];  Service: Urology;  Laterality: N/A;  REQUESTING 1 HR FOR CASE   EYE SURGERY     right cataract surgery with lens implant   TESTICLE SURGERY Right 2011   hydrocele   TRANSURETHRAL RESECTION OF PROSTATE N/A 10/24/2020   Procedure: TRANSURETHRAL RESECTION OF THE PROSTATE (TURP);  Surgeon: Bjorn Pippin, MD;  Location: [facility];  Service: Urology;  Laterality: N/A;    SOCIAL HISTORY: Social History   Socioeconomic History   Marital status: Married    Spouse name: Not on file   Number of children: Not on file   Years of education: Not on file   Highest education level: Not on file  Occupational History   Not on file  Tobacco Use   Smoking status: Former    Packs/day: 1.50    Years: 30.00    Total pack years: 45.00    Types: Cigarettes    Quit date: 07/30/1995    Years since quitting: 27.0   Smokeless tobacco: Never  Vaping Use   Vaping Use: Never used  Substance and Sexual Activity   Alcohol use: No   Drug use: No   Sexual activity: Not on file  Other Topics Concern   Not  on file  Social History Narrative   Not on file   Social Determinants of Health   Financial Resource Strain: Not on file  Food Insecurity: No Food Insecurity (05/09/2022)   Hunger Vital Sign    Worried About Running Out of Food in the Last Year: Never true    Ran Out of Food in the Last Year: Never true  Transportation Needs: No Transportation Needs (05/09/2022)   PRAPARE - Administrator, Civil Service (Medical): No    Lack of Transportation (Non-Medical): No  Physical Activity: Not on file  Stress: Not on  file  Social Connections: Not on file  Intimate Partner Violence: Not on file    FAMILY HISTORY: No family history on file.  ALLERGIES:  has No Known Allergies.  MEDICATIONS:  Current Outpatient Medications  Medication Sig Dispense Refill   acetaminophen (TYLENOL) 325 MG tablet Take 650 mg by mouth every 6 (six) hours as needed (for pain.).     aspirin EC 81 MG tablet Take 1 tablet (81 mg total) by mouth daily. Swallow whole. 30 tablet 12   b complex vitamins capsule Take 1 capsule by mouth daily. 30 capsule 11   calcium carbonate (TUMS - DOSED IN MG ELEMENTAL CALCIUM) 500 MG chewable tablet Chew 1 tablet by mouth daily.     cholecalciferol (VITAMIN D3) 25 MCG (1000 UNIT) tablet Take 5,000 Units by mouth daily.     doxazosin (CARDURA) 8 MG tablet Take 8 mg by mouth daily in the afternoon.     finasteride (PROSCAR) 5 MG tablet Take 5 mg by mouth daily in the afternoon.     lenalidomide (REVLIMID) 5 MG capsule TAKE 1 CAPSULE BY MOUTH EVERY DAY ON DAYS 1-21 THEN TAKE 7 DAYS OFF 21 capsule 0   pravastatin (PRAVACHOL) 40 MG tablet Take 40 mg by mouth daily before supper.     No current facility-administered medications for this visit.    REVIEW OF SYSTEMS:    10 Point review of Systems was done is negative except as noted above.   PHYSICAL EXAMINATION: ECOG PERFORMANCE STATUS: 1 - Symptomatic but completely ambulatory  Vitals:   08/27/22 1316  BP: 121/60  Pulse: 60  Resp: 16  Temp: 97.6 F (36.4 C)  SpO2: 100%   Filed Weights   08/27/22 1316  Weight: 190 lb 11.2 oz (86.5 kg)     GENERAL:alert, in no acute distress and comfortable SKIN: no acute rashes, no significant lesions EYES: conjunctiva are pink and non-injected, sclera anicteric OROPHARYNX: MMM, no exudates, no oropharyngeal erythema or ulceration NECK: supple, no JVD LYMPH:  no palpable lymphadenopathy in the cervical, axillary or inguinal regions LUNGS: clear to auscultation b/l with normal respiratory  effort HEART: regular rate & rhythm ABDOMEN:  normoactive bowel sounds , non tender, not distended. Extremity: no pedal edema PSYCH: alert & oriented x 3 with fluent speech NEURO: no focal motor/sensory deficits    LABORATORY DATA:      Latest Ref Rng & Units 12/25/2022    1:23 PM 10/22/2022    2:31 PM 08/27/2022   12:57 PM  CBC  WBC 4.0 - 10.5 K/uL 2.6  2.3  2.4   Hemoglobin 13.0 - 17.0 g/dL 16.1  09.6  04.5   Hematocrit 39.0 - 52.0 % 35.0  34.7  35.0   Platelets 150 - 400 K/uL 72  92  97        Latest Ref Rng & Units 12/25/2022    1:23 PM  10/22/2022    2:31 PM 08/27/2022   12:57 PM  CMP  Glucose 70 - 99 mg/dL 829  562  92   BUN 8 - 23 mg/dL 12  10  10    Creatinine 0.61 - 1.24 mg/dL 1.30  8.65  7.84   Sodium 135 - 145 mmol/L 137  137  139   Potassium 3.5 - 5.1 mmol/L 3.6  3.8  3.8   Chloride 98 - 111 mmol/L 105  104  105   CO2 22 - 32 mmol/L 26  26  27    Calcium 8.9 - 10.3 mg/dL 8.8  8.9  9.2   Total Protein 6.5 - 8.1 g/dL 6.9  6.6  6.9   Total Bilirubin 0.3 - 1.2 mg/dL 1.2  1.1  1.0   Alkaline Phos 38 - 126 U/L 49  46  49   AST 15 - 41 U/L 21  20  20    ALT 0 - 44 U/L 22  19  19      Myeloma labs on 09/26/2021 within normal limits  ,not observed M protein   Component     Latest Ref Rng & Units 08/28/2021  IgG (Immunoglobin G), Serum     603 - 1,613 mg/dL 696  IgA     61 - 295 mg/dL 284  IgM (Immunoglobulin M), Srm     15 - 143 mg/dL 28  Total Protein ELP     6.0 - 8.5 g/dL 6.5  Albumin SerPl Elph-Mcnc     2.9 - 4.4 g/dL 3.8  Alpha 1     0.0 - 0.4 g/dL 0.2  Alpha2 Glob SerPl Elph-Mcnc     0.4 - 1.0 g/dL 0.7  B-Globulin SerPl Elph-Mcnc     0.7 - 1.3 g/dL 0.8  Gamma Glob SerPl Elph-Mcnc     0.4 - 1.8 g/dL 0.9  M Protein SerPl Elph-Mcnc     Not Observed g/dL Not Observed  Globulin, Total     2.2 - 3.9 g/dL 2.7  Albumin/Glob SerPl     0.7 - 1.7 1.5  IFE 1      Comment  Please Note (HCV):      Comment  Kappa free light chain     3.3 - 19.4 mg/L 39.2 (H)   Lambda free light chains     5.7 - 26.3 mg/L 28.7 (H)  Kappa, lambda light chain ratio     0.26 - 1.65 1.37   05/18/2021: PET SCAN Interval resolution of the hypermetabolism seen previously in the posterior left iliac bone. 2. Tiny hypermetabolic focus in the region of the left adrenal gland without nodule or mass lesion evident on CT imaging. Close follow-up recommended. 3. Otherwise no new or suspicious hypermetabolism on today's study. 4.  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 Prostatomegaly  06/01/2021: Bone Marrow Biopsy: Surgical Pathology  CASE: [id number]  PATIENT: Tae Hosek  Bone Marrow Report      Clinical History: History of multiple myeloma , (BH)   DIAGNOSIS:   BONE MARROW, ASPIRATE, CLOT, CORE:  -Hypercellular bone marrow for age with trilineage hematopoiesis and 1%  plasma cells  -See comment   PERIPHERAL BLOOD:  -Pancytopenia   COMMENT:   The bone marrow is hypercellular with trilineage hematopoiesis and nonspecific myeloid changes likely related to therapy.  In this  background, the plasma cells represent 1% of all cells in the aspirate associated with scattering of interstitial cells in the clot and biopsy sections with lack of large aggregates or sheets. The plasma  cells display polyclonal staining pattern for kappa and lambda light chains and hence, there is no definitive or diagnostic evidence of residual plasma cell neoplasm.  Correlation with cytogenetic and FISH studies is recommended.   CLOT AND BIOPSY: The sections show cellularity ranging from 20 to 70%  with a mixture of myeloid cell types.  Large clusters or sheets of plasma cells are not identified.  Significant lymphoid aggregates are  not seen.  Immunohistochemical stain for CD138 and in situ hybridization  for kappa and lambda were performed on blocks B1 and C1 with appropriate  controls.  CD138 highlights an extremely minor plasma cell component mostly consisting of scattering of  interstitial cells and displays  polyclonal staining pattern for kappa and lambda light chains.  Bone Marrow count performed on 500 cells shows:  Blasts:   0%   Myeloid:  39%  Promyelocytes: 0%   Erythroid:     52%  Myelocytes:    6%   Lymphocytes:   6%  Metamyelocytes:     3%   Plasma cells:  1%  Bands:    11%  Neutrophils:   14%  M:E ratio:     0.75  Eosinophils:   5%  Basophils:     0%  Monocytes:     2%   Lab Data: CBC performed on 06/01/21 shows:  WBC: 2.2 k/uL  Neutrophils:   56%  Hgb: 10.4 g/dL Lymphocytes:   10%  HCT: 31.0 %    Monocytes:     12%  MCV: 94.8 fL   Eosinophils:   4%  RDW: 14.2 %    Basophils:     2%  PLT: 135 k/uL     ASSESSMENT & PLAN:   77 yo male with    1) Recurrent/persistent hematuria bleeding after prostate surgery. - resolved No fhx or previous personal h/o bleeding issues.  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  6) Neuropathy.     PLAN:  -Patient will be 2 years in remission in September 2024 -Discussed lab results on 12/25/2022 in detail with patient. CBC showed WBC low at 2.6K, hemoglobin of 12.0, and platelets low at 72K. -no anemia -K/L light chains from 11/14/2022 normal -Patient is tolerating Revlimid 5 mg with no severe toxicities  -No other acute new symptoms since his last clinic visit.  -Discussed that Revlimid may be causing low blood counts, or bone marrow changes  may be causing long term low blood counts  -discussed proceeding options such as: Stopping Revlimid Continue maintenance Revlimid beyond the maintence 2 years to see if blood counts normalize. Would consider further lowering dose beyond two years.  -no concern with labs from a myeloma standpoint -answered all of patient's questions in detail -discussed risk and benefits of long term maintenance with regards to patient's blood counts -patient's neutrophils are about 1000, discussed the increased risk of infections.  -Discussed the risk of bleeding with low platelet levels -patient shall finish current cycle of 5  mg Revlimid. He was advised not to start the next cycle. Patient will be off of Revlimid for 4 weeks prior to labwork. -will monitor labs during his week off of Revlimid -if there is a significant improvement in blood counts after 4 weeks off of Revlimid, Patient may have Revlimid-causing suppressive events and I would consider lowering Revlimid dose further -if counts are stable after 4 weeks, it is reasonable to proceed with bone marrow biopsy to rule out genetic changes of the bone marrow. If bone marrow is normal may consider continuing low-dose Revlimid. -will monitor with labs in 3-4 weeks -continue to stay hydrated, around 2 L.  FOLLOW UP:  Labs in 4 weeks Phone visit with Dr Candise Che in 5 weeks  The total time spent in the appointment was 30 minutes* .  All of the patient's questions were answered with apparent satisfaction. The patient knows to call the clinic with any problems, questions or concerns.   Wyvonnia Lora MD MS AAHIVMS [facility] Hematology/Oncology Physician [facility]  .*Total Encounter Time as defined by the Centers for Medicare and Medicaid Services includes, in addition to the face-to-face time of a patient visit (documented in the note above) non-face-to-face time: obtaining and reviewing outside history, ordering and reviewing medications, tests  or procedures, care coordination (communications with other health care professionals or caregivers) and documentation in the medical record.    I,Mitra Faeizi,acting as a Neurosurgeon for Wyvonnia Lora, MD.,have documented all relevant documentation on the behalf of Wyvonnia Lora, MD,as directed by  Wyvonnia Lora, MD while in the presence of Wyvonnia Lora, MD.  .I have reviewed the above documentation for accuracy and completeness, and I agree with the above. Johney Maine MD

## 2022-12-25 NOTE — Patient Instructions (Signed)

## 2022-12-26 LAB — KAPPA/LAMBDA LIGHT CHAINS
Kappa free light chain: 51.2 mg/L — ABNORMAL HIGH (ref 3.3–19.4)
Kappa, lambda light chain ratio: 1.33 (ref 0.26–1.65)
Lambda free light chains: 38.6 mg/L — ABNORMAL HIGH (ref 5.7–26.3)

## 2022-12-27 ENCOUNTER — Telehealth: Payer: Self-pay | Admitting: Hematology

## 2022-12-31 ENCOUNTER — Encounter: Payer: Self-pay | Admitting: Hematology

## 2023-01-02 ENCOUNTER — Telehealth: Payer: Self-pay | Admitting: Hematology

## 2023-01-02 LAB — MULTIPLE MYELOMA PANEL, SERUM
Albumin SerPl Elph-Mcnc: 3.5 g/dL (ref 2.9–4.4)
Albumin/Glob SerPl: 1.2 (ref 0.7–1.7)
Alpha 1: 0.2 g/dL (ref 0.0–0.4)
Alpha2 Glob SerPl Elph-Mcnc: 0.6 g/dL (ref 0.4–1.0)
B-Globulin SerPl Elph-Mcnc: 0.9 g/dL (ref 0.7–1.3)
Gamma Glob SerPl Elph-Mcnc: 1.2 g/dL (ref 0.4–1.8)
Globulin, Total: 3 g/dL (ref 2.2–3.9)
IgA: 348 mg/dL (ref 61–437)
IgG (Immunoglobin G), Serum: 1223 mg/dL (ref 603–1613)
IgM (Immunoglobulin M), Srm: 28 mg/dL (ref 15–143)
Total Protein ELP: 6.5 g/dL (ref 6.0–8.5)

## 2023-01-04 ENCOUNTER — Telehealth: Payer: Self-pay | Admitting: Hematology

## 2023-01-20 ENCOUNTER — Other Ambulatory Visit: Payer: Self-pay

## 2023-01-20 DIAGNOSIS — C9000 Multiple myeloma not having achieved remission: Secondary | ICD-10-CM

## 2023-01-20 MED ORDER — LENALIDOMIDE 5 MG PO CAPS
ORAL_CAPSULE | ORAL | 0 refills | Status: DC
Start: 2023-01-20 — End: 2023-06-06

## 2023-01-22 ENCOUNTER — Other Ambulatory Visit: Payer: Medicare HMO

## 2023-01-28 ENCOUNTER — Telehealth: Payer: Medicare HMO | Admitting: Hematology

## 2023-01-28 ENCOUNTER — Other Ambulatory Visit: Payer: Self-pay

## 2023-01-28 DIAGNOSIS — C9000 Multiple myeloma not having achieved remission: Secondary | ICD-10-CM

## 2023-01-29 ENCOUNTER — Other Ambulatory Visit: Payer: Self-pay

## 2023-01-29 ENCOUNTER — Inpatient Hospital Stay: Payer: Medicare HMO | Attending: Hematology

## 2023-01-29 DIAGNOSIS — C9000 Multiple myeloma not having achieved remission: Secondary | ICD-10-CM | POA: Diagnosis not present

## 2023-01-29 DIAGNOSIS — R351 Nocturia: Secondary | ICD-10-CM | POA: Diagnosis not present

## 2023-01-29 DIAGNOSIS — N401 Enlarged prostate with lower urinary tract symptoms: Secondary | ICD-10-CM | POA: Diagnosis not present

## 2023-01-29 LAB — CMP (CANCER CENTER ONLY)
ALT: 22 U/L (ref 0–44)
AST: 30 U/L (ref 15–41)
Albumin: 3.7 g/dL (ref 3.5–5.0)
Alkaline Phosphatase: 46 U/L (ref 38–126)
Anion gap: 4 — ABNORMAL LOW (ref 5–15)
BUN: 11 mg/dL (ref 8–23)
CO2: 28 mmol/L (ref 22–32)
Calcium: 9.3 mg/dL (ref 8.9–10.3)
Chloride: 103 mmol/L (ref 98–111)
Creatinine: 1.44 mg/dL — ABNORMAL HIGH (ref 0.61–1.24)
GFR, Estimated: 50 mL/min — ABNORMAL LOW (ref >60)
Glucose, Bld: 98 mg/dL (ref 70–99)
Potassium: 3.9 mmol/L (ref 3.5–5.1)
Sodium: 135 mmol/L (ref 135–145)
Total Bilirubin: 1 mg/dL (ref 0.3–1.2)
Total Protein: 6.3 g/dL — ABNORMAL LOW (ref 6.5–8.1)

## 2023-01-29 LAB — CBC WITH DIFFERENTIAL (CANCER CENTER ONLY)
Abs Immature Granulocytes: 0 10*3/uL (ref 0.00–0.07)
Basophils Absolute: 0 10*3/uL (ref 0.0–0.1)
Basophils Relative: 0 %
Eosinophils Absolute: 0.1 10*3/uL (ref 0.0–0.5)
Eosinophils Relative: 3 %
HCT: 34.9 % — ABNORMAL LOW (ref 39.0–52.0)
Hemoglobin: 12 g/dL — ABNORMAL LOW (ref 13.0–17.0)
Immature Granulocytes: 0 %
Lymphocytes Relative: 42 %
Lymphs Abs: 1 10*3/uL (ref 0.7–4.0)
MCH: 30 pg (ref 26.0–34.0)
MCHC: 34.4 g/dL (ref 30.0–36.0)
MCV: 87.3 fL (ref 80.0–100.0)
Monocytes Absolute: 0.2 10*3/uL (ref 0.1–1.0)
Monocytes Relative: 9 %
Neutro Abs: 1.1 10*3/uL — ABNORMAL LOW (ref 1.7–7.7)
Neutrophils Relative %: 46 %
Platelet Count: 76 10*3/uL — ABNORMAL LOW (ref 150–400)
RBC: 4 MIL/uL — ABNORMAL LOW (ref 4.22–5.81)
RDW: 14.5 % (ref 11.5–15.5)
WBC Count: 2.5 10*3/uL — ABNORMAL LOW (ref 4.0–10.5)
nRBC: 0 % (ref 0.0–0.2)

## 2023-01-31 LAB — KAPPA/LAMBDA LIGHT CHAINS
Kappa free light chain: 38 mg/L — ABNORMAL HIGH (ref 3.3–19.4)
Kappa, lambda light chain ratio: 1.36 (ref 0.26–1.65)
Lambda free light chains: 28 mg/L — ABNORMAL HIGH (ref 5.7–26.3)

## 2023-02-04 LAB — MULTIPLE MYELOMA PANEL, SERUM
Albumin SerPl Elph-Mcnc: 3.7 g/dL (ref 2.9–4.4)
Albumin/Glob SerPl: 1.4 (ref 0.7–1.7)
Alpha 1: 0.2 g/dL (ref 0.0–0.4)
Alpha2 Glob SerPl Elph-Mcnc: 0.6 g/dL (ref 0.4–1.0)
B-Globulin SerPl Elph-Mcnc: 0.9 g/dL (ref 0.7–1.3)
Gamma Glob SerPl Elph-Mcnc: 1.2 g/dL (ref 0.4–1.8)
Globulin, Total: 2.8 g/dL (ref 2.2–3.9)
IgA: 292 mg/dL (ref 61–437)
IgG (Immunoglobin G), Serum: 1140 mg/dL (ref 603–1613)
IgM (Immunoglobulin M), Srm: 26 mg/dL (ref 15–143)
Total Protein ELP: 6.5 g/dL (ref 6.0–8.5)

## 2023-02-05 ENCOUNTER — Inpatient Hospital Stay: Payer: Medicare HMO | Admitting: Hematology

## 2023-02-05 DIAGNOSIS — D61818 Other pancytopenia: Secondary | ICD-10-CM | POA: Diagnosis not present

## 2023-02-05 DIAGNOSIS — C9000 Multiple myeloma not having achieved remission: Secondary | ICD-10-CM | POA: Diagnosis not present

## 2023-02-05 NOTE — Progress Notes (Signed)
HEMATOLOGY/ONCOLOGY PHONE VISIT NOTE  DOS. 02/05/23  Patient Care Team: [name], MD (Inactive) as PCP - General (Internal Medicine)  CHIEF COMPLAINTS/PURPOSE OF VISIT:  Follow-up for continued evaluation and management of multiple myeloma  HISTORY OF PRESENTING ILLNESS:  Please see previous notes for details on initial presentation.  INTERVAL HISTORY  Brian Levine is a 77 y.o. male here for continued evaluation and management of his multiple myeloma. Patient was last seen by me on 12/25/2022 and was doing well overall with no new medical concerns.   I connected with Brian Levine on 02/05/23 at  8:40 AM EDT by telephone visit and verified that I am speaking with the correct person using two identifiers.   I discussed the limitations, risks, security and privacy concerns of performing an evaluation and management service by telemedicine and the availability of in-person appointments. I also discussed with the patient that there may be a patient responsible charge related to this service. The patient expressed understanding and agreed to proceed.   Other persons participating in the visit and their role in the encounter: none   Patient's location: home  Provider's location: [facility]   Chief Complaint: evaluation and management of his multiple myeloma    Today, he reports that he is doing well overall with no new symptoms since his last clinical visit. Patient has been holding Revlimid 5 mg over the last month. The results of his recent lab workup was discussed with him in detail.   MEDICAL HISTORY:  Past Medical History:  Diagnosis Date   BPH (benign prostatic hyperplasia) 2004   Constipation    Other pancytopenia (HCC) 05/04/2013   Thrombocytopenia, unspecified (HCC) 08/17/2013   Weight loss 05/04/2013    SURGICAL HISTORY: Past Surgical History:  Procedure Laterality Date   APPENDECTOMY     COLONOSCOPY WITH PROPOFOL N/A 07/17/2015   Procedure: COLONOSCOPY WITH PROPOFOL;   Surgeon: Charolett Bumpers, MD;  Location: [facility];  Service: Endoscopy;  Laterality: N/A;   CYSTOSCOPY WITH FULGERATION N/A 11/08/2020   Procedure: CYSTOSCOPY WITH FULGERATION OF BLEEDERS;  Surgeon: Bjorn Pippin, MD;  Location: [facility];  Service: Urology;  Laterality: N/A;   CYSTOSCOPY WITH FULGERATION N/A 12/01/2020   Procedure: CYSTOSCOPY WITH FULGERATION OF  PROSTATE;  Surgeon: Bjorn Pippin, MD;  Location: [facility];  Service: Urology;  Laterality: N/A;   CYSTOSCOPY WITH INSERTION OF UROLIFT N/A 08/22/2015   Procedure: CYSTOSCOPY WITH INSERTION OF UROLIFT;  Surgeon: Bjorn Pippin, MD;  Location: [facility];  Service: Urology;  Laterality: N/A;   CYSTOSCOPY WITH LITHOLAPAXY N/A 10/24/2020   Procedure: CYSTOSCOPY WITH LITHOLAPAXY;  Surgeon: Bjorn Pippin, MD;  Location: [facility];  Service: Urology;  Laterality: N/A;  REQUESTING 1 HR FOR CASE   EYE SURGERY     right cataract surgery with lens implant   TESTICLE SURGERY Right 2011   hydrocele   TRANSURETHRAL RESECTION OF PROSTATE N/A 10/24/2020   Procedure: TRANSURETHRAL RESECTION OF THE PROSTATE (TURP);  Surgeon: Bjorn Pippin, MD;  Location: [facility];  Service: Urology;  Laterality: N/A;    SOCIAL HISTORY: Social History   Socioeconomic History   Marital status: Married    Spouse name: Not on file   Number of children: Not on file   Years of education: Not on file   Highest education level: Not on file  Occupational History   Not on file  Tobacco Use   Smoking status: Former    Packs/day: 1.50    Years: 30.00    Additional  pack years: 0.00    Total pack years: 45.00    Types: Cigarettes    Quit date: 07/30/1995    Years since quitting: 27.5   Smokeless tobacco: Never  Vaping Use   Vaping Use: Never used  Substance and Sexual Activity   Alcohol use: No   Drug use: No   Sexual activity: Not on file  Other Topics Concern   Not on file  Social History Narrative   Not on file   Social Determinants of Health   Financial Resource Strain: Not on  file  Food Insecurity: No Food Insecurity (05/09/2022)   Hunger Vital Sign    Worried About Running Out of Food in the Last Year: Never true    Ran Out of Food in the Last Year: Never true  Transportation Needs: No Transportation Needs (05/09/2022)   PRAPARE - Administrator, Civil Service (Medical): No    Lack of Transportation (Non-Medical): No  Physical Activity: Not on file  Stress: Not on file  Social Connections: Not on file  Intimate Partner Violence: Not on file    FAMILY HISTORY: No family history on file.  ALLERGIES:  has No Known Allergies.  MEDICATIONS:  Current Outpatient Medications  Medication Sig Dispense Refill   acetaminophen (TYLENOL) 325 MG tablet Take 650 mg by mouth every 6 (six) hours as needed (for pain.).     aspirin EC 81 MG tablet Take 1 tablet (81 mg total) by mouth daily. Swallow whole. 30 tablet 12   b complex vitamins capsule Take 1 capsule by mouth daily. 30 capsule 11   calcium carbonate (TUMS - DOSED IN MG ELEMENTAL CALCIUM) 500 MG chewable tablet Chew 1 tablet by mouth daily.     cholecalciferol (VITAMIN D3) 25 MCG (1000 UNIT) tablet Take 5,000 Units by mouth daily.     doxazosin (CARDURA) 8 MG tablet Take 8 mg by mouth daily in the afternoon.     finasteride (PROSCAR) 5 MG tablet Take 5 mg by mouth daily in the afternoon.     lenalidomide (REVLIMID) 5 MG capsule TAKE 1 CAPSULE BY MOUTH EVERY DAY ON DAYS 1-21 THEN TAKE 7 DAYS OFF 21 capsule 0   pravastatin (PRAVACHOL) 40 MG tablet Take 40 mg by mouth daily before supper.     No current facility-administered medications for this visit.    REVIEW OF SYSTEMS:    10 Point review of Systems was done is negative except as noted above.   PHYSICAL EXAMINATION: TELEMEDICINE VISIT  LABORATORY DATA:      Latest Ref Rng & Units 01/29/2023    2:23 PM 12/25/2022    1:23 PM 10/22/2022    2:31 PM  CBC  WBC 4.0 - 10.5 K/uL 2.5  2.6  2.3   Hemoglobin 13.0 - 17.0 g/dL 29.5  62.1  30.8    Hematocrit 39.0 - 52.0 % 34.9  35.0  34.7   Platelets 150 - 400 K/uL 76  72  92        Latest Ref Rng & Units 01/29/2023    2:23 PM 12/25/2022    1:23 PM 10/22/2022    2:31 PM  CMP  Glucose 70 - 99 mg/dL 98  657  846   BUN 8 - 23 mg/dL 11  12  10    Creatinine 0.61 - 1.24 mg/dL 9.62  9.52  8.41   Sodium 135 - 145 mmol/L 135  137  137   Potassium 3.5 - 5.1 mmol/L 3.9  3.6  3.8   Chloride 98 - 111 mmol/L 103  105  104   CO2 22 - 32 mmol/L 28  26  26    Calcium 8.9 - 10.3 mg/dL 9.3  8.8  8.9   Total Protein 6.5 - 8.1 g/dL 6.3  6.9  6.6   Total Bilirubin 0.3 - 1.2 mg/dL 1.0  1.2  1.1   Alkaline Phos 38 - 126 U/L 46  49  46   AST 15 - 41 U/L 30  21  20    ALT 0 - 44 U/L 22  22  19      Myeloma labs on 09/26/2021 within normal limits  ,not observed M protein   Component     Latest Ref Rng & Units 08/28/2021  IgG (Immunoglobin G), Serum     603 - 1,613 mg/dL 865  IgA     61 - 784 mg/dL 696  IgM (Immunoglobulin M), Srm     15 - 143 mg/dL 28  Total Protein ELP     6.0 - 8.5 g/dL 6.5  Albumin SerPl Elph-Mcnc     2.9 - 4.4 g/dL 3.8  Alpha 1     0.0 - 0.4 g/dL 0.2  Alpha2 Glob SerPl Elph-Mcnc     0.4 - 1.0 g/dL 0.7  B-Globulin SerPl Elph-Mcnc     0.7 - 1.3 g/dL 0.8  Gamma Glob SerPl Elph-Mcnc     0.4 - 1.8 g/dL 0.9  M Protein SerPl Elph-Mcnc     Not Observed g/dL Not Observed  Globulin, Total     2.2 - 3.9 g/dL 2.7  Albumin/Glob SerPl     0.7 - 1.7 1.5  IFE 1      Comment  Please Note (HCV):      Comment  Kappa free light chain     3.3 - 19.4 mg/L 39.2 (H)  Lambda free light chains     5.7 - 26.3 mg/L 28.7 (H)  Kappa, lambda light chain ratio     0.26 - 1.65 1.37   05/18/2021: PET SCAN Interval resolution of the hypermetabolism seen previously in the posterior left iliac bone. 2. Tiny hypermetabolic focus in the region of the left adrenal gland without nodule or mass lesion evident on CT imaging. Close follow-up recommended. 3. Otherwise no new or suspicious  hypermetabolism on today's study. 4.  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 Prostatomegaly  06/01/2021: Bone Marrow Biopsy: Surgical Pathology  CASE: [id number]  PATIENT: Kule Ellinger  Bone Marrow Report      Clinical History: History of multiple myeloma , (BH)   DIAGNOSIS:   BONE MARROW, ASPIRATE, CLOT, CORE:  -Hypercellular bone marrow for age with trilineage hematopoiesis and 1%  plasma cells  -See comment   PERIPHERAL BLOOD:  -Pancytopenia   COMMENT:   The bone marrow is hypercellular with trilineage hematopoiesis and nonspecific myeloid changes likely related to therapy.  In this  background, the plasma cells represent 1% of all cells in the aspirate associated with scattering of interstitial cells in the clot and biopsy sections with lack of large aggregates or sheets. The plasma cells display polyclonal staining pattern for kappa and lambda light chains and hence, there is no definitive or diagnostic evidence of residual plasma cell neoplasm.  Correlation with cytogenetic and FISH studies is recommended.   CLOT AND BIOPSY: The sections show cellularity ranging from 20 to 70%  with a mixture of myeloid cell types.  Large clusters or sheets of plasma cells are not identified.  Significant  lymphoid aggregates are  not seen.  Immunohistochemical stain for CD138 and in situ hybridization  for kappa and lambda were performed on blocks B1 and C1 with appropriate  controls.  CD138 highlights an extremely minor plasma cell component mostly consisting of scattering of interstitial cells and displays  polyclonal staining pattern for kappa and lambda light chains.  Bone Marrow count performed on 500 cells shows:  Blasts:   0%   Myeloid:  39%  Promyelocytes: 0%   Erythroid:     52%  Myelocytes:    6%   Lymphocytes:   6%  Metamyelocytes:     3%   Plasma cells:  1%  Bands:    11%  Neutrophils:   14%  M:E ratio:     0.75  Eosinophils:   5%  Basophils:     0%  Monocytes:     2%    Lab Data: CBC performed on 06/01/21 shows:  WBC: 2.2 k/uL  Neutrophils:   56%  Hgb: 10.4 g/dL Lymphocytes:   16%  HCT: 31.0 %    Monocytes:     12%  MCV: 94.8 fL   Eosinophils:   4%  RDW: 14.2 %    Basophils:     2%  PLT: 135 k/uL     ASSESSMENT & PLAN:   77 yo male with    1) Recurrent/persistent hematuria bleeding after prostate surgery. - resolved No fhx or previous personal h/o bleeding issues.  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  6) Neuropathy.     PLAN:  -Patient will be 2 years in remission in September 2024  -No other acute new symptoms since his last clinic visit.  -Discussed lab results from 01/29/2023 in detail with pateint. CBC showed WBC low at 2.5K, hemoglobin stable at 12.0, and platelets of 76K. -WBC continues to be low with neutropenia and thrombocytopenia, which has not recovered from holding 5MG  Revlimid medication over the last month -Discussed that cytopenias have not resolved while off of Revlimid, and would recommend a bone marrow biopsy to rule out any age-related or medication-related MDS or other etiologies that could be responsible for cytopenias -Patient is agreeable to proceed with bone marrow biopsy in the next 1-2 weeks -Continue to hold Revlimid at this time -Myeloma panel from 01/29/2023 showed no M protein -Kappa/ lambda light chains showed normal ratios with no evidence of myeloma  progression -Discussed to avoid any medications that would lower his platelet counts and NSAID medications. Okay to take Tylenol -Discussed taking neutropenic precautions -He shall return to clinic in 1 month  FOLLOW UP:  CT bone marrow aspiration and biopsy in 2 weeks RTC with Dr Candise Che in 1  month  The total time spent in the appointment was 20 minutes* .  All of the patient's questions were answered with apparent satisfaction. The patient knows to call the clinic with any problems, questions or concerns.   Wyvonnia Lora MD MS AAHIVMS [facility] Hematology/Oncology Physician  [facility]  .*Total Encounter Time as defined by the Centers for Medicare and Medicaid Services includes, in addition to the face-to-face time of a patient visit (documented in the note above) non-face-to-face time: obtaining and reviewing outside history, ordering and reviewing medications, tests or procedures, care coordination (communications with other health care professionals or caregivers) and documentation in the medical record.    I,Mitra Faeizi,acting as a Neurosurgeon for Wyvonnia Lora, MD.,have documented all relevant documentation on the behalf of Wyvonnia Lora, MD,as directed by  Wyvonnia Lora, MD while in the presence of Wyvonnia Lora, MD.  .I have reviewed the above documentation for accuracy and completeness, and I agree with the above. Johney Maine MD

## 2023-02-11 ENCOUNTER — Encounter: Payer: Self-pay | Admitting: Hematology

## 2023-02-25 ENCOUNTER — Encounter: Payer: Self-pay | Admitting: Hematology

## 2023-02-26 ENCOUNTER — Other Ambulatory Visit: Payer: Self-pay | Admitting: Physician Assistant

## 2023-02-26 DIAGNOSIS — Z01818 Encounter for other preprocedural examination: Secondary | ICD-10-CM

## 2023-02-27 ENCOUNTER — Other Ambulatory Visit: Payer: Self-pay

## 2023-02-27 ENCOUNTER — Ambulatory Visit (HOSPITAL_COMMUNITY)
Admission: RE | Admit: 2023-02-27 | Discharge: 2023-02-27 | Disposition: A | Discharge status: Home / Self Care | Payer: Medicare HMO | Source: Ambulatory Visit | Attending: Hematology | Admitting: Hematology

## 2023-02-27 DIAGNOSIS — Z01818 Encounter for other preprocedural examination: Secondary | ICD-10-CM

## 2023-02-27 DIAGNOSIS — D61818 Other pancytopenia: Secondary | ICD-10-CM | POA: Diagnosis not present

## 2023-02-27 DIAGNOSIS — Z1379 Encounter for other screening for genetic and chromosomal anomalies: Secondary | ICD-10-CM | POA: Insufficient documentation

## 2023-02-27 DIAGNOSIS — Z87891 Personal history of nicotine dependence: Secondary | ICD-10-CM | POA: Diagnosis not present

## 2023-02-27 DIAGNOSIS — C9000 Multiple myeloma not having achieved remission: Secondary | ICD-10-CM

## 2023-02-27 LAB — CBC WITH DIFFERENTIAL/PLATELET
Abs Immature Granulocytes: 0.01 10*3/uL (ref 0.00–0.07)
Basophils Absolute: 0 10*3/uL (ref 0.0–0.1)
Basophils Relative: 0 %
Eosinophils Absolute: 0 10*3/uL (ref 0.0–0.5)
Eosinophils Relative: 1 %
HCT: 37.5 % — ABNORMAL LOW (ref 39.0–52.0)
Hemoglobin: 12.7 g/dL — ABNORMAL LOW (ref 13.0–17.0)
Immature Granulocytes: 0 %
Lymphocytes Relative: 33 %
Lymphs Abs: 1 10*3/uL (ref 0.7–4.0)
MCH: 30.4 pg (ref 26.0–34.0)
MCHC: 33.9 g/dL (ref 30.0–36.0)
MCV: 89.7 fL (ref 80.0–100.0)
Monocytes Absolute: 0.3 10*3/uL (ref 0.1–1.0)
Monocytes Relative: 11 %
Neutro Abs: 1.7 10*3/uL (ref 1.7–7.7)
Neutrophils Relative %: 55 %
Platelets: 116 10*3/uL — ABNORMAL LOW (ref 150–400)
RBC: 4.18 MIL/uL — ABNORMAL LOW (ref 4.22–5.81)
RDW: 15.1 % (ref 11.5–15.5)
WBC: 3.1 10*3/uL — ABNORMAL LOW (ref 4.0–10.5)
nRBC: 0 % (ref 0.0–0.2)

## 2023-02-27 MED ORDER — FLUMAZENIL 0.5 MG/5ML IV SOLN
INTRAVENOUS | Status: AC
  Filled 2023-02-27: qty 5

## 2023-02-27 MED ORDER — FENTANYL CITRATE (PF) 100 MCG/2ML IJ SOLN
INTRAMUSCULAR | Status: AC
  Filled 2023-02-27: qty 2

## 2023-02-27 MED ORDER — MIDAZOLAM HCL 2 MG/2ML IJ SOLN
INTRAMUSCULAR | Status: AC
  Filled 2023-02-27: qty 2

## 2023-02-27 MED ORDER — FENTANYL CITRATE (PF) 100 MCG/2ML IJ SOLN
INTRAMUSCULAR | Status: AC | PRN
  Administered 2023-02-27: 50 ug via INTRAVENOUS

## 2023-02-27 MED ORDER — MIDAZOLAM HCL 2 MG/2ML IJ SOLN
INTRAMUSCULAR | Status: AC | PRN
  Administered 2023-02-27: 1 mg via INTRAVENOUS

## 2023-02-27 MED ORDER — NALOXONE HCL 0.4 MG/ML IJ SOLN
INTRAMUSCULAR | Status: AC
  Filled 2023-02-27: qty 1

## 2023-02-27 MED ORDER — SODIUM CHLORIDE 0.9 % IV SOLN: INTRAVENOUS | Status: DC

## 2023-02-27 NOTE — Progress Notes (Signed)
Patient back from IR procedure. Call to wife. No answer, left VM w/ CBN.

## 2023-02-27 NOTE — Procedures (Signed)
Interventional Radiology Procedure Note  Indication: Pancytopenia  Procedure: CT guided aspirate and core biopsy of right iliac bone  Complications: None  Bleeding: Minimal  Farhaan Mir, MD [phone]   

## 2023-02-27 NOTE — H&P (Signed)
Chief Complaint: Patient was seen in consultation today for bone marrow biopsy   Referring Physician(s): Johney Maine  Supervising Physician: Mir, Mauri Reading  Patient Status: [facility] - Out-pt  History of Present Illness: Brian Levine is a 77 y.o. male with hx of myeloma. He has ongoing pancytopenia and is referred for bone marrow biopsy. Last BM bx was 05/2021  PMHx, meds, labs, imaging, allergies reviewed. Feels well, no recent fevers, chills, illness. Has been NPO today as directed.    Past Medical History:  Diagnosis Date   BPH (benign prostatic hyperplasia) 2004   Constipation    Other pancytopenia (HCC) 05/04/2013   Thrombocytopenia, unspecified (HCC) 08/17/2013   Weight loss 05/04/2013    Past Surgical History:  Procedure Laterality Date   APPENDECTOMY     COLONOSCOPY WITH PROPOFOL N/A 07/17/2015   Procedure: COLONOSCOPY WITH PROPOFOL;  Surgeon: Charolett Bumpers, MD;  Location: [facility];  Service: Endoscopy;  Laterality: N/A;   CYSTOSCOPY WITH FULGERATION N/A 11/08/2020   Procedure: CYSTOSCOPY WITH FULGERATION OF BLEEDERS;  Surgeon: Bjorn Pippin, MD;  Location: [facility];  Service: Urology;  Laterality: N/A;   CYSTOSCOPY WITH FULGERATION N/A 12/01/2020   Procedure: CYSTOSCOPY WITH FULGERATION OF  PROSTATE;  Surgeon: Bjorn Pippin, MD;  Location: [facility];  Service: Urology;  Laterality: N/A;   CYSTOSCOPY WITH INSERTION OF UROLIFT N/A 08/22/2015   Procedure: CYSTOSCOPY WITH INSERTION OF UROLIFT;  Surgeon: Bjorn Pippin, MD;  Location: [facility];  Service: Urology;  Laterality: N/A;   CYSTOSCOPY WITH LITHOLAPAXY N/A 10/24/2020   Procedure: CYSTOSCOPY WITH LITHOLAPAXY;  Surgeon: Bjorn Pippin, MD;  Location: [facility];  Service: Urology;  Laterality: N/A;  REQUESTING 1 HR FOR CASE   EYE SURGERY     right cataract surgery with lens implant   TESTICLE SURGERY Right 2011   hydrocele   TRANSURETHRAL RESECTION OF PROSTATE N/A 10/24/2020   Procedure: TRANSURETHRAL RESECTION OF THE PROSTATE  (TURP);  Surgeon: Bjorn Pippin, MD;  Location: [facility];  Service: Urology;  Laterality: N/A;    Allergies: Patient has no known allergies.  Medications: Prior to Admission medications   Medication Sig Start Date End Date Taking? Authorizing Provider  acetaminophen (TYLENOL) 325 MG tablet Take 650 mg by mouth every 6 (six) hours as needed (for pain.).    [provider]  aspirin EC 81 MG tablet Take 1 tablet (81 mg total) by mouth daily. Swallow whole. 03/12/22   Johney Maine, MD  b complex vitamins capsule Take 1 capsule by mouth daily. 12/18/21   Johney Maine, MD  calcium carbonate (TUMS - DOSED IN MG ELEMENTAL CALCIUM) 500 MG chewable tablet Chew 1 tablet by mouth daily.    [provider]  cholecalciferol (VITAMIN D3) 25 MCG (1000 UNIT) tablet Take 5,000 Units by mouth daily.    [provider]  doxazosin (CARDURA) 8 MG tablet Take 8 mg by mouth daily in the afternoon.    [provider]  finasteride (PROSCAR) 5 MG tablet Take 5 mg by mouth daily in the afternoon.    [provider]  lenalidomide (REVLIMID) 5 MG capsule TAKE 1 CAPSULE BY MOUTH EVERY DAY ON DAYS 1-21 THEN TAKE 7 DAYS OFF 01/20/23   Johney Maine, MD  pravastatin (PRAVACHOL) 40 MG tablet Take 40 mg by mouth daily before supper.    [provider]  prochlorperazine (COMPAZINE) 10 MG tablet Take 1 tablet (10 mg total) by mouth every 6 (six) hours as needed (Nausea or vomiting).  Patient not taking: Reported on 11/20/2021 01/30/21 12/18/21  Johney Maine, MD     No family history on file.  Social History   Socioeconomic History   Marital status: Married    Spouse name: Not on file   Number of children: Not on file   Years of education: Not on file   Highest education level: Not on file  Occupational History   Not on file  Tobacco Use   Smoking status: Former    Current packs/day: 0.00    Average packs/day: 1.5 packs/day for 30.0 years (45.0  ttl pk-yrs)    Types: Cigarettes    Start date: 07/29/1965    Quit date: 07/30/1995    Years since quitting: 27.6   Smokeless tobacco: Never  Vaping Use   Vaping status: Never Used  Substance and Sexual Activity   Alcohol use: No   Drug use: No   Sexual activity: Not on file  Other Topics Concern   Not on file  Social History Narrative   Not on file   Social Determinants of Health   Financial Resource Strain: Not on file  Food Insecurity: No Food Insecurity (05/09/2022)   Hunger Vital Sign    Worried About Running Out of Food in the Last Year: Never true    Ran Out of Food in the Last Year: Never true  Transportation Needs: No Transportation Needs (05/09/2022)   PRAPARE - Administrator, Civil Service (Medical): No    Lack of Transportation (Non-Medical): No  Physical Activity: Not on file  Stress: Not on file  Social Connections: Not on file    Review of Systems: A 12 point ROS discussed and pertinent positives are indicated in the HPI above.  All other systems are negative.  Review of Systems  Vital Signs: There were no vitals taken for this visit.  Physical Exam HENT:     Mouth/Throat:     Mouth: Mucous membranes are moist.     Pharynx: Oropharynx is clear.  Cardiovascular:     Rate and Rhythm: Normal rate and regular rhythm.     Heart sounds: Normal heart sounds.  Pulmonary:     Effort: Pulmonary effort is normal. No respiratory distress.     Breath sounds: Normal breath sounds.  Skin:    General: Skin is warm and dry.  Neurological:     General: No focal deficit present.     Mental Status: He is alert and oriented to person, place, and time.  Psychiatric:        Mood and Affect: Mood normal.        Thought Content: Thought content normal.     Imaging: No results found.  Labs:  CBC: Recent Labs    08/27/22 1257 10/22/22 1431 12/25/22 1323 01/29/23 1423  WBC 2.4* 2.3* 2.6* 2.5*  HGB 12.2* 12.2* 12.0* 12.0*  HCT 35.0* 34.7* 35.0*  34.9*  PLT 97* 92* 72* 76*    COAGS: No results for input(s): "INR", "APTT" in the last 8760 hours.  BMP: Recent Labs    08/27/22 1257 10/22/22 1431 12/25/22 1323 01/29/23 1423  NA 139 137 137 135  K 3.8 3.8 3.6 3.9  CL 105 104 105 103  CO2 27 26 26 28   GLUCOSE 92 112* 117* 98  BUN 10 10 12 11   CALCIUM 9.2 8.9 8.8* 9.3  CREATININE 1.46* 1.46* 1.41* 1.44*  GFRNONAA 50* 50* 51* 50*    LIVER FUNCTION TESTS: Recent Labs  08/27/22 1257 10/22/22 1431 12/25/22 1323 01/29/23 1423  BILITOT 1.0 1.1 1.2 1.0  AST 20 20 21 30   ALT 19 19 22 22   ALKPHOS 49 46 49 46  PROT 6.9 6.6 6.9 6.3*  ALBUMIN 3.8 3.8 3.9 3.7     Assessment and Plan: Pancytopenia with hx of myeloma For image guided bone marrow biopsy. Risks and benefits of bone marrow bx was discussed with the patient and/or patient's family including, but not limited to bleeding, infection, damage to adjacent structures or low yield requiring additional tests.  All of the questions were answered and there is agreement to proceed.  Consent signed and in chart.    Electronically Signed: Brayton El, PA-C 02/27/2023, 9:24 AM   I spent a total of 20 minutes in face to face in clinical consultation, greater than 50% of which was counseling/coordinating care for bone marrow bx

## 2023-02-27 NOTE — Discharge Instructions (Signed)
Moderate Conscious Sedation-Care After  This sheet gives you information about how to care for yourself after your procedure. Your health care provider may also give you more specific instructions. If you have problems or questions, contact your health care provider.  After the procedure, it is common to have: Sleepiness for several hours. Impaired judgment for several hours. Difficulty with balance. Vomiting if you eat too soon.  Follow these instructions at home:  Rest. Do not participate in activities where you could fall or become injured. Do not drive or use machinery. Do not drink alcohol. Do not take sleeping pills or medicines that cause drowsiness. Do not make important decisions or sign legal documents. Do not take care of children on your own.  Eating and drinking Follow the diet recommended by your health care provider. Drink enough fluid to keep your urine pale yellow. If you vomit: Drink water, juice, or soup when you can drink without vomiting. Make sure you have little or no nausea before eating solid foods.  General instructions Take over-the-counter and prescription medicines only as told by your health care provider. Have a responsible adult stay with you for the time you are told. It is important to have someone help care for you until you are awake and alert. Do not smoke. Keep all follow-up visits as told by your health care provider. This is important.  Contact a health care provider if: You are still sleepy or having trouble with balance after 24 hours. You feel light-headed. You keep feeling nauseous or you keep vomiting. You develop a rash. You have a fever. You have redness or swelling around the IV site.  Get help right away if: You have trouble breathing. You have new-onset confusion at home.  This information is not intended to replace advice given to you by your health care provider. Make sure you discuss any questions you have with your  healthcare provider.  Please call Interventional Radiology clinic [phone] with any questions or concerns.  You may remove your dressing and shower tomorrow.  After the procedure, it is common to have: Soreness Bruising Mild pain  Follow these instructions at home:  Medication: Do not use Aspirin or ibuprofen products, such as Advil or Motrin, as it may increase bleeding You may resume your usual medications as ordered by your doctor If your doctor prescribed antibiotics, take them as directed. Do not stop taking them just because you feel better. You need to take the full course of antibiotics  Eating and drinking: Drink plenty of liquids to keep your urine pale yellow You can resume your regular diet as directed by your doctor   Care of the procedure site  Check your biopsy site every day until it heals  Keep the bandage dry. You can take the bandage off and shower tomorrow  If you are bleeding from the biopsy site, apply firm pressure on the area for at least 30 minutes  If directed, apply ice to your biopsy site 2-3 times a day.  o Put ice in a plastic bag  o Place a towel between your skin and the ice bag  o Leave the ice in place for 20 minutes 2-3 times a day  Activity Do not take baths, swim, or use a hot tub until your health care provider approves  Keep all follow-up visits as told by your doctor  Contact your doctor or seek immediate medical care if: You have bright red bleeding from the biopsy site that does not  stop after 30 minutes of holding direct pressure on the site. You have signs of infection, such as: Increased pain, swelling, warmth, or redness at the biopsy site Red streaks leading from the biopsy site Yellow or green drainage from the biopsy site A fever (temperature over 100.55F) chills, or both

## 2023-03-06 ENCOUNTER — Encounter (HOSPITAL_COMMUNITY): Payer: Self-pay | Admitting: Hematology

## 2023-03-06 ENCOUNTER — Other Ambulatory Visit: Payer: Self-pay | Admitting: Hematology

## 2023-03-10 ENCOUNTER — Encounter (HOSPITAL_COMMUNITY): Payer: Self-pay | Admitting: Hematology

## 2023-03-21 ENCOUNTER — Other Ambulatory Visit: Payer: Self-pay

## 2023-03-21 DIAGNOSIS — C9000 Multiple myeloma not having achieved remission: Secondary | ICD-10-CM

## 2023-03-24 ENCOUNTER — Inpatient Hospital Stay: Payer: Medicare HMO | Admitting: Hematology

## 2023-03-24 ENCOUNTER — Inpatient Hospital Stay: Payer: Medicare HMO | Attending: Hematology

## 2023-03-24 VITALS — BP 135/62 | HR 56 | Temp 97.7°F | Resp 20 | Wt 191.7 lb

## 2023-03-24 DIAGNOSIS — C9000 Multiple myeloma not having achieved remission: Secondary | ICD-10-CM | POA: Diagnosis not present

## 2023-03-24 DIAGNOSIS — D61818 Other pancytopenia: Secondary | ICD-10-CM | POA: Diagnosis not present

## 2023-03-24 LAB — CBC WITH DIFFERENTIAL (CANCER CENTER ONLY)
Abs Immature Granulocytes: 0 10*3/uL (ref 0.00–0.07)
Basophils Absolute: 0 10*3/uL (ref 0.0–0.1)
Basophils Relative: 0 %
Eosinophils Absolute: 0.1 10*3/uL (ref 0.0–0.5)
Eosinophils Relative: 2 %
HCT: 37.2 % — ABNORMAL LOW (ref 39.0–52.0)
Hemoglobin: 12.7 g/dL — ABNORMAL LOW (ref 13.0–17.0)
Immature Granulocytes: 0 %
Lymphocytes Relative: 39 %
Lymphs Abs: 1.3 10*3/uL (ref 0.7–4.0)
MCH: 30.6 pg (ref 26.0–34.0)
MCHC: 34.1 g/dL (ref 30.0–36.0)
MCV: 89.6 fL (ref 80.0–100.0)
Monocytes Absolute: 0.3 10*3/uL (ref 0.1–1.0)
Monocytes Relative: 9 %
Neutro Abs: 1.7 10*3/uL (ref 1.7–7.7)
Neutrophils Relative %: 50 %
Platelet Count: 91 10*3/uL — ABNORMAL LOW (ref 150–400)
RBC: 4.15 MIL/uL — ABNORMAL LOW (ref 4.22–5.81)
RDW: 14.6 % (ref 11.5–15.5)
WBC Count: 3.4 10*3/uL — ABNORMAL LOW (ref 4.0–10.5)
nRBC: 0 % (ref 0.0–0.2)

## 2023-03-24 LAB — CMP (CANCER CENTER ONLY)
ALT: 16 U/L (ref 0–44)
AST: 19 U/L (ref 15–41)
Albumin: 4.1 g/dL (ref 3.5–5.0)
Alkaline Phosphatase: 44 U/L (ref 38–126)
Anion gap: 4 — ABNORMAL LOW (ref 5–15)
BUN: 11 mg/dL (ref 8–23)
CO2: 31 mmol/L (ref 22–32)
Calcium: 9.6 mg/dL (ref 8.9–10.3)
Chloride: 101 mmol/L (ref 98–111)
Creatinine: 1.49 mg/dL — ABNORMAL HIGH (ref 0.61–1.24)
GFR, Estimated: 48 mL/min — ABNORMAL LOW (ref >60)
Glucose, Bld: 116 mg/dL — ABNORMAL HIGH (ref 70–99)
Potassium: 4.3 mmol/L (ref 3.5–5.1)
Sodium: 136 mmol/L (ref 135–145)
Total Bilirubin: 0.9 mg/dL (ref 0.3–1.2)
Total Protein: 6.8 g/dL (ref 6.5–8.1)

## 2023-03-24 NOTE — Progress Notes (Signed)
HEMATOLOGY/ONCOLOGY CLINIC NOTE  DOS. 03/24/23  Patient Care Team: Irven Coe, MD as PCP - General (Family Medicine)  CHIEF COMPLAINTS/PURPOSE OF VISIT:  Follow-up for continued evaluation and management of multiple myeloma  HISTORY OF PRESENTING ILLNESS:  Please see previous notes for details on initial presentation.  INTERVAL HISTORY  Brian Levine is a 77 y.o. male here for continued evaluation and management of his multiple myeloma. I had a phone visit with the patient on 02/05/2023 and he was doing well overall.   Patient is accompanied by his wife during this visit. He notes he has been doing well overall without any new or severe medical concerns since our last visit. He notes his appetite and energy have improved after he started to hold Revlimid.   He denies any new infection issues, fever, chills, night sweats, unexpected weight loss, bone pain, chest pain, back pain, or leg swelling. He does complain of chronic bilateral leg numbness, which has not changed. He denies walking issues and balance issues with leg numbness.    MEDICAL HISTORY:  Past Medical History:  Diagnosis Date   BPH (benign prostatic hyperplasia) 2004   Constipation    Other pancytopenia (HCC) 05/04/2013   Thrombocytopenia, unspecified (HCC) 08/17/2013   Weight loss 05/04/2013    SURGICAL HISTORY: Past Surgical History:  Procedure Laterality Date   APPENDECTOMY     COLONOSCOPY WITH PROPOFOL N/A 07/17/2015   Procedure: COLONOSCOPY WITH PROPOFOL;  Surgeon: Charolett Bumpers, MD;  Location: [facility];  Service: Endoscopy;  Laterality: N/A;   CYSTOSCOPY WITH FULGERATION N/A 11/08/2020   Procedure: CYSTOSCOPY WITH FULGERATION OF BLEEDERS;  Surgeon: Bjorn Pippin, MD;  Location: [facility];  Service: Urology;  Laterality: N/A;   CYSTOSCOPY WITH FULGERATION N/A 12/01/2020   Procedure: CYSTOSCOPY WITH FULGERATION OF  PROSTATE;  Surgeon: Bjorn Pippin, MD;  Location: [facility];  Service: Urology;  Laterality: N/A;    CYSTOSCOPY WITH INSERTION OF UROLIFT N/A 08/22/2015   Procedure: CYSTOSCOPY WITH INSERTION OF UROLIFT;  Surgeon: Bjorn Pippin, MD;  Location: [facility];  Service: Urology;  Laterality: N/A;   CYSTOSCOPY WITH LITHOLAPAXY N/A 10/24/2020   Procedure: CYSTOSCOPY WITH LITHOLAPAXY;  Surgeon: Bjorn Pippin, MD;  Location: [facility];  Service: Urology;  Laterality: N/A;  REQUESTING 1 HR FOR CASE   EYE SURGERY     right cataract surgery with lens implant   TESTICLE SURGERY Right 2011   hydrocele   TRANSURETHRAL RESECTION OF PROSTATE N/A 10/24/2020   Procedure: TRANSURETHRAL RESECTION OF THE PROSTATE (TURP);  Surgeon: Bjorn Pippin, MD;  Location: [facility];  Service: Urology;  Laterality: N/A;    SOCIAL HISTORY: Social History   Socioeconomic History   Marital status: Married    Spouse name: Not on file   Number of children: Not on file   Years of education: Not on file   Highest education level: Not on file  Occupational History   Not on file  Tobacco Use   Smoking status: Former    Current packs/day: 0.00    Average packs/day: 1.5 packs/day for 30.0 years (45.0 ttl pk-yrs)    Types: Cigarettes    Start date: 07/29/1965    Quit date: 07/30/1995    Years since quitting: 27.6   Smokeless tobacco: Never  Vaping Use   Vaping status: Never Used  Substance and Sexual Activity   Alcohol use: No   Drug use: No   Sexual activity: Not on file  Other Topics Concern   Not on file  Social  History Narrative   Not on file   Social Determinants of Health   Financial Resource Strain: Not on file  Food Insecurity: No Food Insecurity (05/09/2022)   Hunger Vital Sign    Worried About Running Out of Food in the Last Year: Never true    Ran Out of Food in the Last Year: Never true  Transportation Needs: No Transportation Needs (05/09/2022)   PRAPARE - Administrator, Civil Service (Medical): No    Lack of Transportation (Non-Medical): No  Physical Activity: Not on file  Stress: Not on file  Social  Connections: Not on file  Intimate Partner Violence: Not on file    FAMILY HISTORY: No family history on file.  ALLERGIES:  has No Known Allergies.  MEDICATIONS:  Current Outpatient Medications  Medication Sig Dispense Refill   acetaminophen (TYLENOL) 325 MG tablet Take 650 mg by mouth every 6 (six) hours as needed (for pain.).     aspirin EC 81 MG tablet Take 1 tablet (81 mg total) by mouth daily. Swallow whole. 30 tablet 12   b complex vitamins capsule Take 1 capsule by mouth daily. 30 capsule 11   calcium carbonate (TUMS - DOSED IN MG ELEMENTAL CALCIUM) 500 MG chewable tablet Chew 1 tablet by mouth daily.     cholecalciferol (VITAMIN D3) 25 MCG (1000 UNIT) tablet Take 5,000 Units by mouth daily.     doxazosin (CARDURA) 8 MG tablet Take 8 mg by mouth daily in the afternoon.     finasteride (PROSCAR) 5 MG tablet Take 5 mg by mouth daily in the afternoon.     lenalidomide (REVLIMID) 5 MG capsule TAKE 1 CAPSULE BY MOUTH EVERY DAY ON DAYS 1-21 THEN TAKE 7 DAYS OFF 21 capsule 0   pravastatin (PRAVACHOL) 40 MG tablet Take 40 mg by mouth daily before supper.     No current facility-administered medications for this visit.    REVIEW OF SYSTEMS:    10 Point review of Systems was done is negative except as noted above.   PHYSICAL EXAMINATION:  .BP 135/62   Pulse (!) 56   Temp 97.7 F (36.5 C)   Resp 20   Wt 191 lb 11.2 oz (87 kg)   SpO2 100%   BMI 26.74 kg/m  . GENERAL:alert, in no acute distress and comfortable SKIN: no acute rashes, no significant lesions EYES: conjunctiva are pink and non-injected, sclera anicteric OROPHARYNX: MMM, no exudates, no oropharyngeal erythema or ulceration NECK: supple, no JVD LYMPH:  no palpable lymphadenopathy in the cervical, axillary or inguinal regions LUNGS: clear to auscultation b/l with normal respiratory effort HEART: regular rate & rhythm ABDOMEN:  normoactive bowel sounds , non tender, not distended. Extremity: no pedal edema PSYCH:  alert & oriented x 3 with fluent speech NEURO: no focal motor/sensory deficits   LABORATORY DATA:      Latest Ref Rng & Units 03/24/2023    1:52 PM 02/27/2023    9:26 AM 01/29/2023    2:23 PM  CBC  WBC 4.0 - 10.5 K/uL 3.4  3.1  2.5   Hemoglobin 13.0 - 17.0 g/dL 16.1  09.6  04.5   Hematocrit 39.0 - 52.0 % 37.2  37.5  34.9   Platelets 150 - 400 K/uL 91  116  76        Latest Ref Rng & Units 03/24/2023    1:52 PM 01/29/2023    2:23 PM 12/25/2022    1:23 PM  CMP  Glucose 70 -  99 mg/dL 161  98  096   BUN 8 - 23 mg/dL 11  11  12    Creatinine 0.61 - 1.24 mg/dL 0.45  4.09  8.11   Sodium 135 - 145 mmol/L 136  135  137   Potassium 3.5 - 5.1 mmol/L 4.3  3.9  3.6   Chloride 98 - 111 mmol/L 101  103  105   CO2 22 - 32 mmol/L 31  28  26    Calcium 8.9 - 10.3 mg/dL 9.6  9.3  8.8   Total Protein 6.5 - 8.1 g/dL 6.8  6.3  6.9   Total Bilirubin 0.3 - 1.2 mg/dL 0.9  1.0  1.2   Alkaline Phos 38 - 126 U/L 44  46  49   AST 15 - 41 U/L 19  30  21    ALT 0 - 44 U/L 16  22  22      Myeloma labs on 09/26/2021 within normal limits  ,not observed M protein   Component     Latest Ref Rng & Units 08/28/2021  IgG (Immunoglobin G), Serum     603 - 1,613 mg/dL 914  IgA     61 - 782 mg/dL 956  IgM (Immunoglobulin M), Srm     15 - 143 mg/dL 28  Total Protein ELP     6.0 - 8.5 g/dL 6.5  Albumin SerPl Elph-Mcnc     2.9 - 4.4 g/dL 3.8  Alpha 1     0.0 - 0.4 g/dL 0.2  Alpha2 Glob SerPl Elph-Mcnc     0.4 - 1.0 g/dL 0.7  B-Globulin SerPl Elph-Mcnc     0.7 - 1.3 g/dL 0.8  Gamma Glob SerPl Elph-Mcnc     0.4 - 1.8 g/dL 0.9  M Protein SerPl Elph-Mcnc     Not Observed g/dL Not Observed  Globulin, Total     2.2 - 3.9 g/dL 2.7  Albumin/Glob SerPl     0.7 - 1.7 1.5  IFE 1      Comment  Please Note (HCV):      Comment  Kappa free light chain     3.3 - 19.4 mg/L 39.2 (H)  Lambda free light chains     5.7 - 26.3 mg/L 28.7 (H)  Kappa, lambda light chain ratio     0.26 - 1.65 1.37   05/18/2021: PET  SCAN Interval resolution of the hypermetabolism seen previously in the posterior left iliac bone. 2. Tiny hypermetabolic focus in the region of the left adrenal gland without nodule or mass lesion evident on CT imaging. Close follow-up recommended. 3. Otherwise no new or suspicious hypermetabolism on today's study. 4.  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 Prostatomegaly  06/01/2021: Bone Marrow Biopsy: Surgical Pathology  CASE: [id number]  PATIENT: Bradly Kovar  Bone Marrow Report      Clinical History: History of multiple myeloma , (BH)   DIAGNOSIS:   BONE MARROW, ASPIRATE, CLOT, CORE:  -Hypercellular bone marrow for age with trilineage hematopoiesis and 1%  plasma cells  -See comment   PERIPHERAL BLOOD:  -Pancytopenia   COMMENT:   The bone marrow is hypercellular with trilineage hematopoiesis and nonspecific myeloid changes likely related to therapy.  In this  background, the plasma cells represent 1% of all cells in the aspirate associated with scattering of interstitial cells in the clot and biopsy sections with lack of large aggregates or sheets. The plasma cells display polyclonal staining pattern for kappa and lambda light chains and hence, there is no definitive  or diagnostic evidence of residual plasma cell neoplasm.  Correlation with cytogenetic and FISH studies is recommended.   CLOT AND BIOPSY: The sections show cellularity ranging from 20 to 70%  with a mixture of myeloid cell types.  Large clusters or sheets of plasma cells are not identified.  Significant lymphoid aggregates are  not seen.  Immunohistochemical stain for CD138 and in situ hybridization  for kappa and lambda were performed on blocks B1 and C1 with appropriate  controls.  CD138 highlights an extremely minor plasma cell component mostly consisting of scattering of interstitial cells and displays  polyclonal staining pattern for kappa and lambda light chains.  Bone Marrow count performed on  500 cells shows:  Blasts:   0%   Myeloid:  39%  Promyelocytes: 0%   Erythroid:     52%  Myelocytes:    6%   Lymphocytes:   6%  Metamyelocytes:     3%   Plasma cells:  1%  Bands:    11%  Neutrophils:   14%  M:E ratio:     0.75  Eosinophils:   5%  Basophils:     0%  Monocytes:     2%   Lab Data: CBC performed on 06/01/21 shows:  WBC: 2.2 k/uL  Neutrophils:   56%  Hgb: 10.4 g/dL Lymphocytes:   96%  HCT: 31.0 %    Monocytes:     12%  MCV: 94.8 fL   Eosinophils:   4%  RDW: 14.2 %    Basophils:     2%  PLT: 135 k/uL     ASSESSMENT & PLAN:   77 yo male with    1) Recurrent/persistent hematuria bleeding after prostate surgery. - resolved No fhx or previous personal h/o bleeding issues.  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  6) Neuropathy.     PLAN: -Discussed lab results from today, 03/24/2023, with the patient. CBC shows decreased WBC count of 3.4 K, slightly decreased hemoglobin at 12.7 g/dL, slightly decreased hematocrit of 37.2%, and decreased platelet count at 91 K.  CMP is stable  with CKD  -Discussed CT Bone marrow biopsy results from 02/27/2023. The bone marrow is variably cellular with some bone marrow particles displaying very low cellularity (less than 10%). No signs of myeloma or any other abnormalities.  -Discussed with the patient that since he has ben in remission for a while, we can discontinue Revlimid  for now. Patient agrees.  -Discontinue Revlimid and discontinue Zometa infusions.  -Recommend to eat healthy, stay well hydrated, and exercise regularly.  -Recommend to start B-Complex supplement.   FOLLOW UP:  Labs in 8 weeks Phone visit in 10 weeks  The total time spent in the appointment was 30 minutes* .  All of the patient's questions were answered with apparent satisfaction. The patient knows to call the clinic with any problems, questions or concerns.   Wyvonnia Lora MD MS AAHIVMS [facility] Hematology/Oncology Physician [facility]  .*  Total Encounter Time as defined by the Centers for Medicare and Medicaid Services includes, in addition to the face-to-face time of a patient visit (documented in the note above) non-face-to-face time: obtaining and reviewing outside history, ordering and reviewing medications, tests or procedures, care coordination (communications with other health care professionals or caregivers) and documentation in the medical record.   I,Param Shah,acting as a Neurosurgeon for Wyvonnia Lora, MD.,have documented all relevant documentation on the behalf of Wyvonnia Lora, MD,as directed by  Wyvonnia Lora, MD while in the presence of Wyvonnia Lora, MD.   .I have reviewed the above documentation for accuracy and completeness, and I agree with the above. Johney Maine MD

## 2023-03-26 LAB — KAPPA/LAMBDA LIGHT CHAINS
Kappa free light chain: 34.6 mg/L — ABNORMAL HIGH (ref 3.3–19.4)
Kappa, lambda light chain ratio: 1.28 (ref 0.26–1.65)
Lambda free light chains: 27 mg/L — ABNORMAL HIGH (ref 5.7–26.3)

## 2023-03-28 LAB — MULTIPLE MYELOMA PANEL, SERUM
Albumin SerPl Elph-Mcnc: 3.7 g/dL (ref 2.9–4.4)
Albumin/Glob SerPl: 1.4 (ref 0.7–1.7)
Alpha 1: 0.2 g/dL (ref 0.0–0.4)
Alpha2 Glob SerPl Elph-Mcnc: 0.6 g/dL (ref 0.4–1.0)
B-Globulin SerPl Elph-Mcnc: 0.9 g/dL (ref 0.7–1.3)
Gamma Glob SerPl Elph-Mcnc: 1.1 g/dL (ref 0.4–1.8)
Globulin, Total: 2.8 g/dL (ref 2.2–3.9)
IgA: 269 mg/dL (ref 61–437)
IgG (Immunoglobin G), Serum: 1107 mg/dL (ref 603–1613)
IgM (Immunoglobulin M), Srm: 26 mg/dL (ref 15–143)
Total Protein ELP: 6.5 g/dL (ref 6.0–8.5)

## 2023-03-31 ENCOUNTER — Encounter: Payer: Self-pay | Admitting: Hematology

## 2023-04-12 DIAGNOSIS — Z23 Encounter for immunization: Secondary | ICD-10-CM | POA: Diagnosis not present

## 2023-04-29 ENCOUNTER — Ambulatory Visit (HOSPITAL_COMMUNITY): Payer: Medicare HMO

## 2023-05-22 ENCOUNTER — Other Ambulatory Visit: Payer: Self-pay

## 2023-05-22 DIAGNOSIS — C9000 Multiple myeloma not having achieved remission: Secondary | ICD-10-CM

## 2023-05-26 ENCOUNTER — Other Ambulatory Visit: Payer: Self-pay

## 2023-05-26 ENCOUNTER — Inpatient Hospital Stay: Payer: Medicare HMO | Attending: Hematology

## 2023-05-26 DIAGNOSIS — C9000 Multiple myeloma not having achieved remission: Secondary | ICD-10-CM | POA: Insufficient documentation

## 2023-05-26 LAB — CMP (CANCER CENTER ONLY)
ALT: 16 U/L (ref 0–44)
AST: 18 U/L (ref 15–41)
Albumin: 4 g/dL (ref 3.5–5.0)
Alkaline Phosphatase: 40 U/L (ref 38–126)
Anion gap: 5 (ref 5–15)
BUN: 11 mg/dL (ref 8–23)
CO2: 30 mmol/L (ref 22–32)
Calcium: 9.4 mg/dL (ref 8.9–10.3)
Chloride: 101 mmol/L (ref 98–111)
Creatinine: 1.46 mg/dL — ABNORMAL HIGH (ref 0.61–1.24)
GFR, Estimated: 49 mL/min — ABNORMAL LOW (ref >60)
Glucose, Bld: 89 mg/dL (ref 70–99)
Potassium: 4 mmol/L (ref 3.5–5.1)
Sodium: 136 mmol/L (ref 135–145)
Total Bilirubin: 0.9 mg/dL (ref 0.3–1.2)
Total Protein: 6.8 g/dL (ref 6.5–8.1)

## 2023-05-26 LAB — CBC WITH DIFFERENTIAL (CANCER CENTER ONLY)
Abs Immature Granulocytes: 0 10*3/uL (ref 0.00–0.07)
Basophils Absolute: 0 10*3/uL (ref 0.0–0.1)
Basophils Relative: 0 %
Eosinophils Absolute: 0.1 10*3/uL (ref 0.0–0.5)
Eosinophils Relative: 2 %
HCT: 37.3 % — ABNORMAL LOW (ref 39.0–52.0)
Hemoglobin: 12.8 g/dL — ABNORMAL LOW (ref 13.0–17.0)
Immature Granulocytes: 0 %
Lymphocytes Relative: 38 %
Lymphs Abs: 1.5 10*3/uL (ref 0.7–4.0)
MCH: 30.7 pg (ref 26.0–34.0)
MCHC: 34.3 g/dL (ref 30.0–36.0)
MCV: 89.4 fL (ref 80.0–100.0)
Monocytes Absolute: 0.3 10*3/uL (ref 0.1–1.0)
Monocytes Relative: 9 %
Neutro Abs: 2 10*3/uL (ref 1.7–7.7)
Neutrophils Relative %: 51 %
Platelet Count: 92 10*3/uL — ABNORMAL LOW (ref 150–400)
RBC: 4.17 MIL/uL — ABNORMAL LOW (ref 4.22–5.81)
RDW: 13 % (ref 11.5–15.5)
WBC Count: 3.9 10*3/uL — ABNORMAL LOW (ref 4.0–10.5)
nRBC: 0 % (ref 0.0–0.2)

## 2023-05-27 LAB — KAPPA/LAMBDA LIGHT CHAINS
Kappa free light chain: 30.6 mg/L — ABNORMAL HIGH (ref 3.3–19.4)
Kappa, lambda light chain ratio: 1.29 (ref 0.26–1.65)
Lambda free light chains: 23.7 mg/L (ref 5.7–26.3)

## 2023-05-29 LAB — MULTIPLE MYELOMA PANEL, SERUM
Albumin SerPl Elph-Mcnc: 3.6 g/dL (ref 2.9–4.4)
Albumin/Glob SerPl: 1.4 (ref 0.7–1.7)
Alpha 1: 0.2 g/dL (ref 0.0–0.4)
Alpha2 Glob SerPl Elph-Mcnc: 0.6 g/dL (ref 0.4–1.0)
B-Globulin SerPl Elph-Mcnc: 0.8 g/dL (ref 0.7–1.3)
Gamma Glob SerPl Elph-Mcnc: 1.1 g/dL (ref 0.4–1.8)
Globulin, Total: 2.7 g/dL (ref 2.2–3.9)
IgA: 250 mg/dL (ref 61–437)
IgG (Immunoglobin G), Serum: 1150 mg/dL (ref 603–1613)
IgM (Immunoglobulin M), Srm: 30 mg/dL (ref 15–143)
Total Protein ELP: 6.3 g/dL (ref 6.0–8.5)

## 2023-06-05 NOTE — Progress Notes (Signed)
HEMATOLOGY/ONCOLOGY PHONE VISIT NOTE  DOS. 06/06/2023  Patient Care Team: Irven Coe, MD as PCP - General (Family Medicine)  CHIEF COMPLAINTS/PURPOSE OF VISIT:  Follow-up for continued evaluation and management of multiple myeloma  HISTORY OF PRESENTING ILLNESS:  Please see previous notes for details on initial presentation.  INTERVAL HISTORY  Brian Levine is a 77 y.o. male here for continued evaluation and management of his multiple myeloma. Patient was last seen by me on 03/24/2023 and reported stable chronic bilateral leg numbness, and was otherwise doing well overall.  I connected with Brian Levine on 06/06/2023 at  8:40 AM EST by telephone visit and verified that I am speaking with the correct person using two identifiers.   I discussed the limitations, risks, security and privacy concerns of performing an evaluation and management service by telemedicine and the availability of in-person appointments. I also discussed with the patient that there may be a patient responsible charge related to this service. The patient expressed understanding and agreed to proceed.   Other persons participating in the visit and their role in the encounter: none   Patient's location: home  Provider's location: [facility]   Chief Complaint: continued evaluation and management of his multiple myeloma    Today, he reports that he has been feeling well overall. Patient reports no new symptoms.  He reports that he sometimes endorses minimal numbness in his toes from his previous neuropathy, which is not bothersome or affecting his sleep and he is functioning well.   No other acute new symptoms such as fever, chills, night sweats, or new bone pains.   Patient notes that he is UTD with his age-appropriate vaccinations.  The results of his recent lab work up was discussed with him in detail.   MEDICAL HISTORY:  Past Medical History:  Diagnosis Date   BPH (benign prostatic hyperplasia) 2004    Constipation    Other pancytopenia (HCC) 05/04/2013   Thrombocytopenia, unspecified (HCC) 08/17/2013   Weight loss 05/04/2013    SURGICAL HISTORY: Past Surgical History:  Procedure Laterality Date   APPENDECTOMY     COLONOSCOPY WITH PROPOFOL N/A 07/17/2015   Procedure: COLONOSCOPY WITH PROPOFOL;  Surgeon: Charolett Bumpers, MD;  Location: [facility];  Service: Endoscopy;  Laterality: N/A;   CYSTOSCOPY WITH FULGERATION N/A 11/08/2020   Procedure: CYSTOSCOPY WITH FULGERATION OF BLEEDERS;  Surgeon: Bjorn Pippin, MD;  Location: [facility];  Service: Urology;  Laterality: N/A;   CYSTOSCOPY WITH FULGERATION N/A 12/01/2020   Procedure: CYSTOSCOPY WITH FULGERATION OF  PROSTATE;  Surgeon: Bjorn Pippin, MD;  Location: [facility];  Service: Urology;  Laterality: N/A;   CYSTOSCOPY WITH INSERTION OF UROLIFT N/A 08/22/2015   Procedure: CYSTOSCOPY WITH INSERTION OF UROLIFT;  Surgeon: Bjorn Pippin, MD;  Location: [facility];  Service: Urology;  Laterality: N/A;   CYSTOSCOPY WITH LITHOLAPAXY N/A 10/24/2020   Procedure: CYSTOSCOPY WITH LITHOLAPAXY;  Surgeon: Bjorn Pippin, MD;  Location: [facility];  Service: Urology;  Laterality: N/A;  REQUESTING 1 HR FOR CASE   EYE SURGERY     right cataract surgery with lens implant   TESTICLE SURGERY Right 2011   hydrocele   TRANSURETHRAL RESECTION OF PROSTATE N/A 10/24/2020   Procedure: TRANSURETHRAL RESECTION OF THE PROSTATE (TURP);  Surgeon: Bjorn Pippin, MD;  Location: [facility];  Service: Urology;  Laterality: N/A;    SOCIAL HISTORY: Social History   Socioeconomic History   Marital status: Married    Spouse name: Not on file   Number of children: Not  on file   Years of education: Not on file   Highest education level: Not on file  Occupational History   Not on file  Tobacco Use   Smoking status: Former    Current packs/day: 0.00    Average packs/day: 1.5 packs/day for 30.0 years (45.0 ttl pk-yrs)    Types: Cigarettes    Start date: 07/29/1965    Quit date: 07/30/1995    Years since  quitting: 27.8   Smokeless tobacco: Never  Vaping Use   Vaping status: Never Used  Substance and Sexual Activity   Alcohol use: No   Drug use: No   Sexual activity: Not on file  Other Topics Concern   Not on file  Social History Narrative   Not on file   Social Determinants of Health   Financial Resource Strain: Not on file  Food Insecurity: No Food Insecurity (05/09/2022)   Hunger Vital Sign    Worried About Running Out of Food in the Last Year: Never true    Ran Out of Food in the Last Year: Never true  Transportation Needs: No Transportation Needs (05/09/2022)   PRAPARE - Administrator, Civil Service (Medical): No    Lack of Transportation (Non-Medical): No  Physical Activity: Not on file  Stress: Not on file  Social Connections: Not on file  Intimate Partner Violence: Not on file    FAMILY HISTORY: No family history on file.  ALLERGIES:  has No Known Allergies.  MEDICATIONS:  Current Outpatient Medications  Medication Sig Dispense Refill   acetaminophen (TYLENOL) 325 MG tablet Take 650 mg by mouth every 6 (six) hours as needed (for pain.).     aspirin EC 81 MG tablet Take 1 tablet (81 mg total) by mouth daily. Swallow whole. 30 tablet 12   b complex vitamins capsule Take 1 capsule by mouth daily. 30 capsule 11   calcium carbonate (TUMS - DOSED IN MG ELEMENTAL CALCIUM) 500 MG chewable tablet Chew 1 tablet by mouth daily.     cholecalciferol (VITAMIN D3) 25 MCG (1000 UNIT) tablet Take 5,000 Units by mouth daily.     doxazosin (CARDURA) 8 MG tablet Take 8 mg by mouth daily in the afternoon.     finasteride (PROSCAR) 5 MG tablet Take 5 mg by mouth daily in the afternoon.     lenalidomide (REVLIMID) 5 MG capsule TAKE 1 CAPSULE BY MOUTH EVERY DAY ON DAYS 1-21 THEN TAKE 7 DAYS OFF 21 capsule 0   pravastatin (PRAVACHOL) 40 MG tablet Take 40 mg by mouth daily before supper.     No current facility-administered medications for this visit.    REVIEW OF SYSTEMS:     10 Point review of Systems was done is negative except as noted above.   PHYSICAL EXAMINATION: TELEMEDICINE VISIT .There were no vitals taken for this visit.    LABORATORY DATA:      Latest Ref Rng & Units 05/26/2023   12:57 PM 03/24/2023    1:52 PM 02/27/2023    9:26 AM  CBC  WBC 4.0 - 10.5 K/uL 3.9  3.4  3.1   Hemoglobin 13.0 - 17.0 g/dL 40.9  81.1  91.4   Hematocrit 39.0 - 52.0 % 37.3  37.2  37.5   Platelets 150 - 400 K/uL 92  91  116        Latest Ref Rng & Units 05/26/2023   12:57 PM 03/24/2023    1:52 PM 01/29/2023    2:23 PM  CMP  Glucose 70 - 99 mg/dL 89  161  98   BUN 8 - 23 mg/dL 11  11  11    Creatinine 0.61 - 1.24 mg/dL 0.96  0.45  4.09   Sodium 135 - 145 mmol/L 136  136  135   Potassium 3.5 - 5.1 mmol/L 4.0  4.3  3.9   Chloride 98 - 111 mmol/L 101  101  103   CO2 22 - 32 mmol/L 30  31  28    Calcium 8.9 - 10.3 mg/dL 9.4  9.6  9.3   Total Protein 6.5 - 8.1 g/dL 6.8  6.8  6.3   Total Bilirubin 0.3 - 1.2 mg/dL 0.9  0.9  1.0   Alkaline Phos 38 - 126 U/L 40  44  46   AST 15 - 41 U/L 18  19  30    ALT 0 - 44 U/L 16  16  22      Myeloma labs on 09/26/2021 within normal limits  ,not observed M protein   Component     Latest Ref Rng & Units 08/28/2021  IgG (Immunoglobin G), Serum     603 - 1,613 mg/dL 811  IgA     61 - 914 mg/dL 782  IgM (Immunoglobulin M), Srm     15 - 143 mg/dL 28  Total Protein ELP     6.0 - 8.5 g/dL 6.5  Albumin SerPl Elph-Mcnc     2.9 - 4.4 g/dL 3.8  Alpha 1     0.0 - 0.4 g/dL 0.2  Alpha2 Glob SerPl Elph-Mcnc     0.4 - 1.0 g/dL 0.7  B-Globulin SerPl Elph-Mcnc     0.7 - 1.3 g/dL 0.8  Gamma Glob SerPl Elph-Mcnc     0.4 - 1.8 g/dL 0.9  M Protein SerPl Elph-Mcnc     Not Observed g/dL Not Observed  Globulin, Total     2.2 - 3.9 g/dL 2.7  Albumin/Glob SerPl     0.7 - 1.7 1.5  IFE 1      Comment  Please Note (HCV):      Comment  Kappa free light chain     3.3 - 19.4 mg/L 39.2 (H)  Lambda free light chains     5.7 - 26.3 mg/L 28.7  (H)  Kappa, lambda light chain ratio     0.26 - 1.65 1.37   05/18/2021: PET SCAN Interval resolution of the hypermetabolism seen previously in the posterior left iliac bone. 2. Tiny hypermetabolic focus in the region of the left adrenal gland without nodule or mass lesion evident on CT imaging. Close follow-up recommended. 3. Otherwise no new or suspicious hypermetabolism on today's study. 4.  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 Prostatomegaly  06/01/2021: Bone Marrow Biopsy: Surgical Pathology  CASE: [id number]  PATIENT: Oluwatomiwa Cai  Bone Marrow Report      Clinical History: History of multiple myeloma , (BH)   DIAGNOSIS:   BONE MARROW, ASPIRATE, CLOT, CORE:  -Hypercellular bone marrow for age with trilineage hematopoiesis and 1%  plasma cells  -See comment   PERIPHERAL BLOOD:  -Pancytopenia   COMMENT:   The bone marrow is hypercellular with trilineage hematopoiesis and nonspecific myeloid changes likely related to therapy.  In this  background, the plasma cells represent 1% of all cells in the aspirate associated with scattering of interstitial cells in the clot and biopsy sections with lack of large aggregates or sheets. The plasma cells display polyclonal staining pattern for kappa and lambda light chains and  hence, there is no definitive or diagnostic evidence of residual plasma cell neoplasm.  Correlation with cytogenetic and FISH studies is recommended.   CLOT AND BIOPSY: The sections show cellularity ranging from 20 to 70%  with a mixture of myeloid cell types.  Large clusters or sheets of plasma cells are not identified.  Significant lymphoid aggregates are  not seen.  Immunohistochemical stain for CD138 and in situ hybridization  for kappa and lambda were performed on blocks B1 and C1 with appropriate  controls.  CD138 highlights an extremely minor plasma cell component mostly consisting of scattering of interstitial cells and displays  polyclonal  staining pattern for kappa and lambda light chains.  Bone Marrow count performed on 500 cells shows:  Blasts:   0%   Myeloid:  39%  Promyelocytes: 0%   Erythroid:     52%  Myelocytes:    6%   Lymphocytes:   6%  Metamyelocytes:     3%   Plasma cells:  1%  Bands:    11%  Neutrophils:   14%  M:E ratio:     0.75  Eosinophils:   5%  Basophils:     0%  Monocytes:     2%   Lab Data: CBC performed on 06/01/21 shows:  WBC: 2.2 k/uL  Neutrophils:   56%  Hgb: 10.4 g/dL Lymphocytes:   40%  HCT: 31.0 %    Monocytes:     12%  MCV: 94.8 fL   Eosinophils:   4%  RDW: 14.2 %    Basophils:     2%  PLT: 135 k/uL     ASSESSMENT & PLAN:   77 yo male with    1) Recurrent/persistent hematuria bleeding after prostate surgery. - resolved No fhx or previous personal h/o bleeding issues.  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  6) Neuropathy.     PLAN:  -Discussed lab results from 05/26/2023 in detail with patient. CBC showed WBC of 3.9K, hemoglobin of 12.8, and platelets of 92K. -His blood counts continue to improve while off of Revlimid maintenance with near-normal WBC . There is resolution of anemia with hgb 12.8.  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 which has been addressed by his urologust -recommend to connect with PCP to discuss the  possibility of a referral to nephrology to evaluate and mange CKD -Discussed that his myeloma labs show no monoclonal protein and his KL free light chains were WNL with normal ratio at this time, suggesting that he continues to be in remission at this time -We will continue to choose to hold revlimid given his preference and concern for cytopenia even at dose-reduced levels of 5 mg -We will continue to moniro closely with repeat labs and phone visit in 3 months  FOLLOW UP:  Phone visit with Dr Candise Che in 3 months Labs 1 week prior to phone visit  The  total time spent in the appointment was *** minutes* .  All of the patient's questions were answered with apparent satisfaction. The patient knows to call the clinic with any problems, questions or concerns.   Wyvonnia Lora MD MS AAHIVMS [facility] Hematology/Oncology Physician [facility]  .*Total Encounter Time as defined by the Centers for Medicare and Medicaid Services includes, in addition to the face-to-face time of a patient visit (documented in the note above) non-face-to-face time: obtaining and reviewing outside history, ordering and reviewing medications, tests or procedures, care coordination (communications with other health care professionals or caregivers) and documentation in the medical record.    I,Mitra Faeizi,acting as a Neurosurgeon for Wyvonnia Lora, MD.,have documented all relevant documentation on the behalf of Wyvonnia Lora, MD,as directed by  Wyvonnia Lora, MD while in the presence of Wyvonnia Lora, MD.  ***

## 2023-06-06 ENCOUNTER — Inpatient Hospital Stay: Payer: Medicare HMO | Attending: Hematology | Admitting: Hematology

## 2023-06-06 ENCOUNTER — Other Ambulatory Visit: Payer: Self-pay | Admitting: Hematology

## 2023-06-06 DIAGNOSIS — Z7982 Long term (current) use of aspirin: Secondary | ICD-10-CM | POA: Diagnosis not present

## 2023-06-06 DIAGNOSIS — Z5111 Encounter for antineoplastic chemotherapy: Secondary | ICD-10-CM

## 2023-06-06 DIAGNOSIS — C9000 Multiple myeloma not having achieved remission: Secondary | ICD-10-CM | POA: Diagnosis not present

## 2023-06-06 DIAGNOSIS — Z79899 Other long term (current) drug therapy: Secondary | ICD-10-CM | POA: Insufficient documentation

## 2023-06-06 DIAGNOSIS — Z87891 Personal history of nicotine dependence: Secondary | ICD-10-CM | POA: Diagnosis not present

## 2023-06-09 ENCOUNTER — Telehealth: Payer: Self-pay | Admitting: Hematology

## 2023-06-09 NOTE — Telephone Encounter (Signed)
Patient is aware of scheduled appointment times/dates per Dr. Candise Che scheduling orders on 06/06/2023

## 2023-06-12 ENCOUNTER — Encounter: Payer: Self-pay | Admitting: Hematology

## 2023-06-13 DIAGNOSIS — Z0100 Encounter for examination of eyes and vision without abnormal findings: Secondary | ICD-10-CM | POA: Diagnosis not present

## 2023-06-13 DIAGNOSIS — H524 Presbyopia: Secondary | ICD-10-CM | POA: Diagnosis not present

## 2023-06-19 DIAGNOSIS — E7800 Pure hypercholesterolemia, unspecified: Secondary | ICD-10-CM | POA: Diagnosis not present

## 2023-06-19 DIAGNOSIS — N400 Enlarged prostate without lower urinary tract symptoms: Secondary | ICD-10-CM | POA: Diagnosis not present

## 2023-06-20 DIAGNOSIS — N400 Enlarged prostate without lower urinary tract symptoms: Secondary | ICD-10-CM | POA: Diagnosis not present

## 2023-06-20 DIAGNOSIS — E7800 Pure hypercholesterolemia, unspecified: Secondary | ICD-10-CM | POA: Diagnosis not present

## 2023-06-20 DIAGNOSIS — Z1211 Encounter for screening for malignant neoplasm of colon: Secondary | ICD-10-CM | POA: Diagnosis not present

## 2023-06-20 DIAGNOSIS — C9000 Multiple myeloma not having achieved remission: Secondary | ICD-10-CM | POA: Diagnosis not present

## 2023-06-20 DIAGNOSIS — D61818 Other pancytopenia: Secondary | ICD-10-CM | POA: Diagnosis not present

## 2023-06-20 DIAGNOSIS — Z23 Encounter for immunization: Secondary | ICD-10-CM | POA: Diagnosis not present

## 2023-06-20 DIAGNOSIS — Z0000 Encounter for general adult medical examination without abnormal findings: Secondary | ICD-10-CM | POA: Diagnosis not present

## 2023-06-20 DIAGNOSIS — I700 Atherosclerosis of aorta: Secondary | ICD-10-CM | POA: Diagnosis not present

## 2023-06-20 DIAGNOSIS — D696 Thrombocytopenia, unspecified: Secondary | ICD-10-CM | POA: Diagnosis not present

## 2023-06-23 DIAGNOSIS — Z1211 Encounter for screening for malignant neoplasm of colon: Secondary | ICD-10-CM | POA: Diagnosis not present

## 2023-06-24 DIAGNOSIS — L821 Other seborrheic keratosis: Secondary | ICD-10-CM | POA: Diagnosis not present

## 2023-07-08 DIAGNOSIS — L820 Inflamed seborrheic keratosis: Secondary | ICD-10-CM | POA: Diagnosis not present

## 2023-07-08 DIAGNOSIS — L538 Other specified erythematous conditions: Secondary | ICD-10-CM | POA: Diagnosis not present

## 2023-07-08 DIAGNOSIS — L2989 Other pruritus: Secondary | ICD-10-CM | POA: Diagnosis not present

## 2023-08-11 DIAGNOSIS — L538 Other specified erythematous conditions: Secondary | ICD-10-CM | POA: Diagnosis not present

## 2023-08-11 DIAGNOSIS — L820 Inflamed seborrheic keratosis: Secondary | ICD-10-CM | POA: Diagnosis not present

## 2023-08-11 DIAGNOSIS — L2989 Other pruritus: Secondary | ICD-10-CM | POA: Diagnosis not present

## 2023-08-29 ENCOUNTER — Other Ambulatory Visit: Payer: Self-pay

## 2023-08-29 DIAGNOSIS — C9000 Multiple myeloma not having achieved remission: Secondary | ICD-10-CM

## 2023-09-01 ENCOUNTER — Inpatient Hospital Stay: Payer: Medicare HMO | Attending: Hematology

## 2023-09-01 DIAGNOSIS — C9000 Multiple myeloma not having achieved remission: Secondary | ICD-10-CM | POA: Diagnosis not present

## 2023-09-01 LAB — CMP (CANCER CENTER ONLY)
ALT: 17 U/L (ref 0–44)
AST: 20 U/L (ref 15–41)
Albumin: 4.2 g/dL (ref 3.5–5.0)
Alkaline Phosphatase: 43 U/L (ref 38–126)
Anion gap: 6 (ref 5–15)
BUN: 10 mg/dL (ref 8–23)
CO2: 29 mmol/L (ref 22–32)
Calcium: 9.4 mg/dL (ref 8.9–10.3)
Chloride: 100 mmol/L (ref 98–111)
Creatinine: 1.39 mg/dL — ABNORMAL HIGH (ref 0.61–1.24)
GFR, Estimated: 52 mL/min — ABNORMAL LOW (ref >60)
Glucose, Bld: 107 mg/dL — ABNORMAL HIGH (ref 70–99)
Potassium: 4.2 mmol/L (ref 3.5–5.1)
Sodium: 135 mmol/L (ref 135–145)
Total Bilirubin: 0.9 mg/dL (ref 0.0–1.2)
Total Protein: 7 g/dL (ref 6.5–8.1)

## 2023-09-01 LAB — CBC WITH DIFFERENTIAL (CANCER CENTER ONLY)
Abs Immature Granulocytes: 0.01 10*3/uL (ref 0.00–0.07)
Basophils Absolute: 0 10*3/uL (ref 0.0–0.1)
Basophils Relative: 0 %
Eosinophils Absolute: 0.1 10*3/uL (ref 0.0–0.5)
Eosinophils Relative: 1 %
HCT: 39 % (ref 39.0–52.0)
Hemoglobin: 13.4 g/dL (ref 13.0–17.0)
Immature Granulocytes: 0 %
Lymphocytes Relative: 25 %
Lymphs Abs: 1.3 10*3/uL (ref 0.7–4.0)
MCH: 30.8 pg (ref 26.0–34.0)
MCHC: 34.4 g/dL (ref 30.0–36.0)
MCV: 89.7 fL (ref 80.0–100.0)
Monocytes Absolute: 0.4 10*3/uL (ref 0.1–1.0)
Monocytes Relative: 8 %
Neutro Abs: 3.2 10*3/uL (ref 1.7–7.7)
Neutrophils Relative %: 66 %
Platelet Count: 125 10*3/uL — ABNORMAL LOW (ref 150–400)
RBC: 4.35 MIL/uL (ref 4.22–5.81)
RDW: 13 % (ref 11.5–15.5)
WBC Count: 5 10*3/uL (ref 4.0–10.5)
nRBC: 0 % (ref 0.0–0.2)

## 2023-09-02 LAB — KAPPA/LAMBDA LIGHT CHAINS
Kappa free light chain: 27.1 mg/L — ABNORMAL HIGH (ref 3.3–19.4)
Kappa, lambda light chain ratio: 1.37 (ref 0.26–1.65)
Lambda free light chains: 19.8 mg/L (ref 5.7–26.3)

## 2023-09-04 LAB — MULTIPLE MYELOMA PANEL, SERUM
Albumin SerPl Elph-Mcnc: 3.7 g/dL (ref 2.9–4.4)
Albumin/Glob SerPl: 1.3 (ref 0.7–1.7)
Alpha 1: 0.3 g/dL (ref 0.0–0.4)
Alpha2 Glob SerPl Elph-Mcnc: 0.8 g/dL (ref 0.4–1.0)
B-Globulin SerPl Elph-Mcnc: 0.9 g/dL (ref 0.7–1.3)
Gamma Glob SerPl Elph-Mcnc: 1.1 g/dL (ref 0.4–1.8)
Globulin, Total: 3 g/dL (ref 2.2–3.9)
IgA: 251 mg/dL (ref 61–437)
IgG (Immunoglobin G), Serum: 1099 mg/dL (ref 603–1613)
IgM (Immunoglobulin M), Srm: 33 mg/dL (ref 15–143)
Total Protein ELP: 6.7 g/dL (ref 6.0–8.5)

## 2023-09-08 ENCOUNTER — Inpatient Hospital Stay (HOSPITAL_BASED_OUTPATIENT_CLINIC_OR_DEPARTMENT_OTHER): Payer: Medicare HMO | Admitting: Hematology

## 2023-09-08 DIAGNOSIS — C9000 Multiple myeloma not having achieved remission: Secondary | ICD-10-CM | POA: Diagnosis not present

## 2023-09-08 NOTE — Progress Notes (Signed)
 HEMATOLOGY/ONCOLOGY PHONE VISIT NOTE  DOS. 09/08/23   Patient Care Team: Irven Coe, MD as PCP - General (Family Medicine)  CHIEF COMPLAINTS/PURPOSE OF VISIT:  Follow-up for continued evaluation and management of multiple myeloma  HISTORY OF PRESENTING ILLNESS:  Please see previous notes for details on initial presentation.  INTERVAL HISTORY  Brian Levine is a 78 y.o. male here for continued evaluation and management of his multiple myeloma.   I connected with Brian Levine on 09/08/2023 at  3:30 PM EST by telephone visit and verified that I am speaking with the correct person using two identifiers.  I last connected with the patient, via phone, on 06/06/2023 and he complained of bilateral leg tingling sensation but was doing well overall.   Patient notes he has been doing well overall since our last visit. He denies any new infection issues, fever, chills, night sweats, unexpected weight loss, back pain, chest pain, bone pain, or leg swelling. He complains of continuous bilateral leg tingling sensation, which has not worsened since our last visit. He denies leg discoloration.   Discussed lab results from 09/01/2023 in detail with the patient.   I discussed the limitations, risks, security and privacy concerns of performing an evaluation and management service by telemedicine and the availability of in-person appointments. I also discussed with the patient that there may be a patient responsible charge related to this service. The patient expressed understanding and agreed to proceed.   Other persons participating in the visit and their role in the encounter: none   Patient's location: home  Provider's location: [facility]   Chief Complaint: continued evaluation and management of his multiple myeloma    MEDICAL HISTORY:  Past Medical History:  Diagnosis Date   BPH (benign prostatic hyperplasia) 2004   Constipation    Other pancytopenia (HCC) 05/04/2013   Thrombocytopenia,  unspecified (HCC) 08/17/2013   Weight loss 05/04/2013    SURGICAL HISTORY: Past Surgical History:  Procedure Laterality Date   APPENDECTOMY     COLONOSCOPY WITH PROPOFOL N/A 07/17/2015   Procedure: COLONOSCOPY WITH PROPOFOL;  Surgeon: Charolett Bumpers, MD;  Location: [facility];  Service: Endoscopy;  Laterality: N/A;   CYSTOSCOPY WITH FULGERATION N/A 11/08/2020   Procedure: CYSTOSCOPY WITH FULGERATION OF BLEEDERS;  Surgeon: Bjorn Pippin, MD;  Location: [facility];  Service: Urology;  Laterality: N/A;   CYSTOSCOPY WITH FULGERATION N/A 12/01/2020   Procedure: CYSTOSCOPY WITH FULGERATION OF  PROSTATE;  Surgeon: Bjorn Pippin, MD;  Location: [facility];  Service: Urology;  Laterality: N/A;   CYSTOSCOPY WITH INSERTION OF UROLIFT N/A 08/22/2015   Procedure: CYSTOSCOPY WITH INSERTION OF UROLIFT;  Surgeon: Bjorn Pippin, MD;  Location: [facility];  Service: Urology;  Laterality: N/A;   CYSTOSCOPY WITH LITHOLAPAXY N/A 10/24/2020   Procedure: CYSTOSCOPY WITH LITHOLAPAXY;  Surgeon: Bjorn Pippin, MD;  Location: [facility];  Service: Urology;  Laterality: N/A;  REQUESTING 1 HR FOR CASE   EYE SURGERY     right cataract surgery with lens implant   TESTICLE SURGERY Right 2011   hydrocele   TRANSURETHRAL RESECTION OF PROSTATE N/A 10/24/2020   Procedure: TRANSURETHRAL RESECTION OF THE PROSTATE (TURP);  Surgeon: Bjorn Pippin, MD;  Location: [facility];  Service: Urology;  Laterality: N/A;    SOCIAL HISTORY: Social History   Socioeconomic History   Marital status: Married    Spouse name: Not on file   Number of children: Not on file   Years of education: Not on file   Highest education level: Not on  file  Occupational History   Not on file  Tobacco Use   Smoking status: Former    Current packs/day: 0.00    Average packs/day: 1.5 packs/day for 30.0 years (45.0 ttl pk-yrs)    Types: Cigarettes    Start date: 07/29/1965    Quit date: 07/30/1995    Years since quitting: 28.1   Smokeless tobacco: Never  Vaping Use   Vaping status:  Never Used  Substance and Sexual Activity   Alcohol use: No   Drug use: No   Sexual activity: Not on file  Other Topics Concern   Not on file  Social History Narrative   Not on file   Social Drivers of Health   Financial Resource Strain: Not on file  Food Insecurity: No Food Insecurity (05/09/2022)   Hunger Vital Sign    Worried About Running Out of Food in the Last Year: Never true    Ran Out of Food in the Last Year: Never true  Transportation Needs: No Transportation Needs (05/09/2022)   PRAPARE - Administrator, Civil Service (Medical): No    Lack of Transportation (Non-Medical): No  Physical Activity: Not on file  Stress: Not on file  Social Connections: Not on file  Intimate Partner Violence: Not on file    FAMILY HISTORY: No family history on file.  ALLERGIES:  has no known allergies.  MEDICATIONS:  Current Outpatient Medications  Medication Sig Dispense Refill   acetaminophen (TYLENOL) 325 MG tablet Take 650 mg by mouth every 6 (six) hours as needed (for pain.).     aspirin EC 81 MG tablet Take 1 tablet (81 mg total) by mouth daily. Swallow whole. 30 tablet 12   b complex vitamins capsule Take 1 capsule by mouth daily. 30 capsule 11   calcium carbonate (TUMS - DOSED IN MG ELEMENTAL CALCIUM) 500 MG chewable tablet Chew 1 tablet by mouth daily.     cholecalciferol (VITAMIN D3) 25 MCG (1000 UNIT) tablet Take 5,000 Units by mouth daily.     doxazosin (CARDURA) 8 MG tablet Take 8 mg by mouth daily in the afternoon.     finasteride (PROSCAR) 5 MG tablet Take 5 mg by mouth daily in the afternoon.     pravastatin (PRAVACHOL) 40 MG tablet Take 40 mg by mouth daily before supper.     No current facility-administered medications for this visit.    REVIEW OF SYSTEMS:    10 Point review of Systems was done is negative except as noted above.   PHYSICAL EXAMINATION: TELEMEDICINE VISIT   LABORATORY DATA:      Latest Ref Rng & Units 09/01/2023    1:19 PM  05/26/2023   12:57 PM 03/24/2023    1:52 PM  CBC  WBC 4.0 - 10.5 K/uL 5.0  3.9  3.4   Hemoglobin 13.0 - 17.0 g/dL 56.2  13.0  86.5   Hematocrit 39.0 - 52.0 % 39.0  37.3  37.2   Platelets 150 - 400 K/uL 125  92  91        Latest Ref Rng & Units 09/01/2023    1:19 PM 05/26/2023   12:57 PM 03/24/2023    1:52 PM  CMP  Glucose 70 - 99 mg/dL 784  89  696   BUN 8 - 23 mg/dL 10  11  11    Creatinine 0.61 - 1.24 mg/dL 2.95  2.84  1.32   Sodium 135 - 145 mmol/L 135  136  136   Potassium  3.5 - 5.1 mmol/L 4.2  4.0  4.3   Chloride 98 - 111 mmol/L 100  101  101   CO2 22 - 32 mmol/L 29  30  31    Calcium 8.9 - 10.3 mg/dL 9.4  9.4  9.6   Total Protein 6.5 - 8.1 g/dL 7.0  6.8  6.8   Total Bilirubin 0.0 - 1.2 mg/dL 0.9  0.9  0.9   Alkaline Phos 38 - 126 U/L 43  40  44   AST 15 - 41 U/L 20  18  19    ALT 0 - 44 U/L 17  16  16      Myeloma labs on 09/26/2021 within normal limits  ,not observed M protein   Component     Latest Ref Rng & Units 08/28/2021  IgG (Immunoglobin G), Serum     603 - 1,613 mg/dL 161  IgA     61 - 096 mg/dL 045  IgM (Immunoglobulin M), Srm     15 - 143 mg/dL 28  Total Protein ELP     6.0 - 8.5 g/dL 6.5  Albumin SerPl Elph-Mcnc     2.9 - 4.4 g/dL 3.8  Alpha 1     0.0 - 0.4 g/dL 0.2  Alpha2 Glob SerPl Elph-Mcnc     0.4 - 1.0 g/dL 0.7  B-Globulin SerPl Elph-Mcnc     0.7 - 1.3 g/dL 0.8  Gamma Glob SerPl Elph-Mcnc     0.4 - 1.8 g/dL 0.9  M Protein SerPl Elph-Mcnc     Not Observed g/dL Not Observed  Globulin, Total     2.2 - 3.9 g/dL 2.7  Albumin/Glob SerPl     0.7 - 1.7 1.5  IFE 1      Comment  Please Note (HCV):      Comment  Kappa free light chain     3.3 - 19.4 mg/L 39.2 (H)  Lambda free light chains     5.7 - 26.3 mg/L 28.7 (H)  Kappa, lambda light chain ratio     0.26 - 1.65 1.37   05/18/2021: PET SCAN Interval resolution of the hypermetabolism seen previously in the posterior left iliac bone. 2. Tiny hypermetabolic focus in the region of the left  adrenal gland without nodule or mass lesion evident on CT imaging. Close follow-up recommended. 3. Otherwise no new or suspicious hypermetabolism on today's study. 4.  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 Prostatomegaly  06/01/2021: Bone Marrow Biopsy: Surgical Pathology  CASE: [id number]  PATIENT: Tyris Mozer  Bone Marrow Report    Clinical History: History of multiple myeloma , (BH)   DIAGNOSIS:   BONE MARROW, ASPIRATE, CLOT, CORE:  -Hypercellular bone marrow for age with trilineage hematopoiesis and 1%  plasma cells  -See comment   PERIPHERAL BLOOD:  -Pancytopenia   COMMENT:   The bone marrow is hypercellular with trilineage hematopoiesis and nonspecific myeloid changes likely related to therapy.  In this  background, the plasma cells represent 1% of all cells in the aspirate associated with scattering of interstitial cells in the clot and biopsy sections with lack of large aggregates or sheets. The plasma cells display polyclonal staining pattern for kappa and lambda light chains and hence, there is no definitive or diagnostic evidence of residual plasma cell neoplasm.  Correlation with cytogenetic and FISH studies is recommended.   CLOT AND BIOPSY: The sections show cellularity ranging from 20 to 70%  with a mixture of myeloid cell types.  Large clusters or sheets of plasma cells are  not identified.  Significant lymphoid aggregates are  not seen.  Immunohistochemical stain for CD138 and in situ hybridization  for kappa and lambda were performed on blocks B1 and C1 with appropriate  controls.  CD138 highlights an extremely minor plasma cell component mostly consisting of scattering of interstitial cells and displays  polyclonal staining pattern for kappa and lambda light chains.  Bone Marrow count performed on 500 cells shows:  Blasts:   0%   Myeloid:  39%  Promyelocytes: 0%   Erythroid:     52%  Myelocytes:    6%   Lymphocytes:   6%  Metamyelocytes:     3%   Plasma  cells:  1%  Bands:    11%  Neutrophils:   14%  M:E ratio:     0.75  Eosinophils:   5%  Basophils:     0%  Monocytes:     2%   Lab Data: CBC performed on 06/01/21 shows:  WBC: 2.2 k/uL  Neutrophils:   56%  Hgb: 10.4 g/dL Lymphocytes:   21%  HCT: 31.0 %    Monocytes:     12%  MCV: 94.8 fL   Eosinophils:   4%  RDW: 14.2 %    Basophils:     2%  PLT: 135 k/uL     ASSESSMENT & PLAN:   78 yo male with    1) Recurrent/persistent hematuria bleeding after prostate surgery. - resolved No fhx or previous personal h/o bleeding issues.  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  6) Neuropathy.     PLAN: -Discussed lab results from 09/01/2023 in detail with the patient. CBC shows low platelets of 125 K. CMP shows elevated creatinine of 1.39, but was stable overall.  -Discussed Multiple myeloma panel results from 09/01/2023. Did not show any abnormalities.  -Kappa/lambda light chain results from 09/01/2023 showed elevated kappa free light chain of 27.1, but stable kappa/lambda light chain ratio of 1.37.   -Discussed with the patient that his platelet counts were low due to Revlimid.  -His blood counts continue to improve while off of Revlimid maintenance with near-normal WBC . -Patient is remission according to labs and clinical symptoms.  -Recommend to start B-Complex supplement.   FOLLOW UP:   Labs in 10 weeks RTC with Dr Candise Che  in 12 weeks   The total time spent in the appointment was 20 minutes* .  All of the patient's questions were answered with apparent satisfaction. The patient knows to call the clinic with any problems, questions or concerns.   Wyvonnia Lora MD MS AAHIVMS [facility] Hematology/Oncology Physician [facility]  .*Total Encounter Time as defined by the Centers for Medicare and Medicaid Services includes, in addition to the face-to-face time of a patient visit (documented in the note above) non-face-to-face time: obtaining and reviewing outside history, ordering and reviewing medications, tests or procedures, care coordination (communications with other health care professionals or caregivers) and documentation in the medical record.  I,Param Shah,acting as a Neurosurgeon for Wyvonnia Lora, MD.,have documented all relevant documentation on the behalf of Wyvonnia Lora, MD,as directed by  Wyvonnia Lora, MD while in the presence of Wyvonnia Lora, MD.   .I have reviewed the above documentation for accuracy and completeness, and I agree with the above. Johney Maine MD

## 2023-09-09 ENCOUNTER — Telehealth: Payer: Self-pay | Admitting: Hematology

## 2023-09-09 NOTE — Telephone Encounter (Signed)
Left patient a vm regarding upcoming appointment

## 2023-09-14 ENCOUNTER — Encounter: Payer: Self-pay | Admitting: Hematology

## 2023-09-30 DIAGNOSIS — M65321 Trigger finger, right index finger: Secondary | ICD-10-CM | POA: Diagnosis not present

## 2023-11-17 ENCOUNTER — Other Ambulatory Visit: Payer: Self-pay

## 2023-11-17 DIAGNOSIS — C9000 Multiple myeloma not having achieved remission: Secondary | ICD-10-CM

## 2023-11-18 ENCOUNTER — Inpatient Hospital Stay: Payer: Medicare HMO | Attending: Hematology

## 2023-11-18 DIAGNOSIS — C9000 Multiple myeloma not having achieved remission: Secondary | ICD-10-CM | POA: Diagnosis not present

## 2023-11-18 LAB — CMP (CANCER CENTER ONLY)
ALT: 14 U/L (ref 0–44)
AST: 21 U/L (ref 15–41)
Albumin: 4.4 g/dL (ref 3.5–5.0)
Alkaline Phosphatase: 43 U/L (ref 38–126)
Anion gap: 5 (ref 5–15)
BUN: 11 mg/dL (ref 8–23)
CO2: 31 mmol/L (ref 22–32)
Calcium: 9.6 mg/dL (ref 8.9–10.3)
Chloride: 101 mmol/L (ref 98–111)
Creatinine: 1.36 mg/dL — ABNORMAL HIGH (ref 0.61–1.24)
GFR, Estimated: 53 mL/min — ABNORMAL LOW (ref >60)
Glucose, Bld: 128 mg/dL — ABNORMAL HIGH (ref 70–99)
Potassium: 4.1 mmol/L (ref 3.5–5.1)
Sodium: 137 mmol/L (ref 135–145)
Total Bilirubin: 1.1 mg/dL (ref 0.0–1.2)
Total Protein: 7.1 g/dL (ref 6.5–8.1)

## 2023-11-18 LAB — CBC WITH DIFFERENTIAL (CANCER CENTER ONLY)
Abs Immature Granulocytes: 0.01 10*3/uL (ref 0.00–0.07)
Basophils Absolute: 0 10*3/uL (ref 0.0–0.1)
Basophils Relative: 0 %
Eosinophils Absolute: 0.1 10*3/uL (ref 0.0–0.5)
Eosinophils Relative: 2 %
HCT: 38.7 % — ABNORMAL LOW (ref 39.0–52.0)
Hemoglobin: 13.4 g/dL (ref 13.0–17.0)
Immature Granulocytes: 0 %
Lymphocytes Relative: 32 %
Lymphs Abs: 1.1 10*3/uL (ref 0.7–4.0)
MCH: 30.4 pg (ref 26.0–34.0)
MCHC: 34.6 g/dL (ref 30.0–36.0)
MCV: 87.8 fL (ref 80.0–100.0)
Monocytes Absolute: 0.2 10*3/uL (ref 0.1–1.0)
Monocytes Relative: 7 %
Neutro Abs: 2 10*3/uL (ref 1.7–7.7)
Neutrophils Relative %: 59 %
Platelet Count: 113 10*3/uL — ABNORMAL LOW (ref 150–400)
RBC: 4.41 MIL/uL (ref 4.22–5.81)
RDW: 12.8 % (ref 11.5–15.5)
WBC Count: 3.4 10*3/uL — ABNORMAL LOW (ref 4.0–10.5)
nRBC: 0 % (ref 0.0–0.2)

## 2023-11-19 LAB — KAPPA/LAMBDA LIGHT CHAINS
Kappa free light chain: 25.3 mg/L — ABNORMAL HIGH (ref 3.3–19.4)
Kappa, lambda light chain ratio: 1.23 (ref 0.26–1.65)
Lambda free light chains: 20.6 mg/L (ref 5.7–26.3)

## 2023-11-21 LAB — MULTIPLE MYELOMA PANEL, SERUM
Albumin SerPl Elph-Mcnc: 4 g/dL (ref 2.9–4.4)
Albumin/Glob SerPl: 1.5 (ref 0.7–1.7)
Alpha 1: 0.2 g/dL (ref 0.0–0.4)
Alpha2 Glob SerPl Elph-Mcnc: 0.6 g/dL (ref 0.4–1.0)
B-Globulin SerPl Elph-Mcnc: 0.9 g/dL (ref 0.7–1.3)
Gamma Glob SerPl Elph-Mcnc: 1 g/dL (ref 0.4–1.8)
Globulin, Total: 2.8 g/dL (ref 2.2–3.9)
IgA: 227 mg/dL (ref 61–437)
IgG (Immunoglobin G), Serum: 1106 mg/dL (ref 603–1613)
IgM (Immunoglobulin M), Srm: 36 mg/dL (ref 15–143)
Total Protein ELP: 6.8 g/dL (ref 6.0–8.5)

## 2023-12-02 ENCOUNTER — Inpatient Hospital Stay: Payer: Medicare HMO | Attending: Hematology | Admitting: Hematology

## 2023-12-02 VITALS — BP 132/64 | HR 67 | Temp 99.0°F | Resp 17 | Wt 192.9 lb

## 2023-12-02 DIAGNOSIS — C9000 Multiple myeloma not having achieved remission: Secondary | ICD-10-CM | POA: Diagnosis not present

## 2023-12-02 DIAGNOSIS — Z7982 Long term (current) use of aspirin: Secondary | ICD-10-CM | POA: Diagnosis not present

## 2023-12-02 DIAGNOSIS — Z79899 Other long term (current) drug therapy: Secondary | ICD-10-CM | POA: Diagnosis not present

## 2023-12-02 DIAGNOSIS — Z87891 Personal history of nicotine dependence: Secondary | ICD-10-CM | POA: Diagnosis not present

## 2023-12-03 ENCOUNTER — Telehealth: Payer: Self-pay | Admitting: Hematology

## 2023-12-03 NOTE — Telephone Encounter (Signed)
 Spoke with patient confirming upcoming appointment

## 2023-12-05 ENCOUNTER — Other Ambulatory Visit (HOSPITAL_BASED_OUTPATIENT_CLINIC_OR_DEPARTMENT_OTHER): Payer: Self-pay | Admitting: Internal Medicine

## 2023-12-05 DIAGNOSIS — M7989 Other specified soft tissue disorders: Secondary | ICD-10-CM | POA: Diagnosis not present

## 2023-12-05 DIAGNOSIS — L089 Local infection of the skin and subcutaneous tissue, unspecified: Secondary | ICD-10-CM | POA: Diagnosis not present

## 2023-12-05 DIAGNOSIS — T148XXA Other injury of unspecified body region, initial encounter: Secondary | ICD-10-CM | POA: Diagnosis not present

## 2023-12-05 NOTE — Progress Notes (Signed)
 HEMATOLOGY/ONCOLOGY CLINIC VISIT NOTE  DOS. 12/02/2023  Patient Care Team: Benedetto Brady, MD as PCP - General (Family Medicine)  CHIEF COMPLAINTS/PURPOSE OF VISIT:  Follow-up for continued evaluation and management of multiple myeloma  HISTORY OF PRESENTING ILLNESS:  Please see previous notes for details on initial presentation.  INTERVAL HISTORY  Brian Levine is a 78 y.o. male here for continued evaluation and management of his multiple myeloma.   I last connected with the patient, via phone, on 09/08/2023 and he complained of continuous bilateral leg tingling sensation.   Patient notes no acute new symptoms since his last clinic visit.  No abnormal bleeding or bruising.  No new focal bone pains.  No new lumps or bumps.  No fevers no chills no night sweats no unexpected weight loss.   MEDICAL HISTORY:  Past Medical History:  Diagnosis Date   BPH (benign prostatic hyperplasia) 2004   Constipation    Other pancytopenia (HCC) 05/04/2013   Thrombocytopenia, unspecified (HCC) 08/17/2013   Weight loss 05/04/2013    SURGICAL HISTORY: Past Surgical History:  Procedure Laterality Date   APPENDECTOMY     COLONOSCOPY WITH PROPOFOL  N/A 07/17/2015   Procedure: COLONOSCOPY WITH PROPOFOL ;  Surgeon: Garrett Kallman, MD;  Location: [facility];  Service: Endoscopy;  Laterality: N/A;   CYSTOSCOPY WITH FULGERATION N/A 11/08/2020   Procedure: CYSTOSCOPY WITH FULGERATION OF BLEEDERS;  Surgeon: Homero Luster, MD;  Location: [facility];  Service: Urology;  Laterality: N/A;   CYSTOSCOPY WITH FULGERATION N/A 12/01/2020   Procedure: CYSTOSCOPY WITH FULGERATION OF  PROSTATE;  Surgeon: Homero Luster, MD;  Location: [facility];  Service: Urology;  Laterality: N/A;   CYSTOSCOPY WITH INSERTION OF UROLIFT N/A 08/22/2015   Procedure: CYSTOSCOPY WITH INSERTION OF UROLIFT;  Surgeon: Homero Luster, MD;  Location: [facility];  Service: Urology;  Laterality: N/A;   CYSTOSCOPY WITH LITHOLAPAXY N/A 10/24/2020   Procedure: CYSTOSCOPY  WITH LITHOLAPAXY;  Surgeon: Homero Luster, MD;  Location: [facility];  Service: Urology;  Laterality: N/A;  REQUESTING 1 HR FOR CASE   EYE SURGERY     right cataract surgery with lens implant   TESTICLE SURGERY Right 2011   hydrocele   TRANSURETHRAL RESECTION OF PROSTATE N/A 10/24/2020   Procedure: TRANSURETHRAL RESECTION OF THE PROSTATE (TURP);  Surgeon: Homero Luster, MD;  Location: [facility];  Service: Urology;  Laterality: N/A;    SOCIAL HISTORY: Social History   Socioeconomic History   Marital status: Married    Spouse name: Not on file   Number of children: Not on file   Years of education: Not on file   Highest education level: Not on file  Occupational History   Not on file  Tobacco Use   Smoking status: Former    Current packs/day: 0.00    Average packs/day: 1.5 packs/day for 30.0 years (45.0 ttl pk-yrs)    Types: Cigarettes    Start date: 07/29/1965    Quit date: 07/30/1995    Years since quitting: 28.3   Smokeless tobacco: Never  Vaping Use   Vaping status: Never Used  Substance and Sexual Activity   Alcohol use: No   Drug use: No   Sexual activity: Not on file  Other Topics Concern   Not on file  Social History Narrative   Not on file   Social Drivers of Health   Financial Resource Strain: Not on file  Food Insecurity: No Food Insecurity (05/09/2022)   Hunger Vital Sign    Worried About Running Out of Food  in the Last Year: Never true    Ran Out of Food in the Last Year: Never true  Transportation Needs: No Transportation Needs (05/09/2022)   PRAPARE - Administrator, Civil Service (Medical): No    Lack of Transportation (Non-Medical): No  Physical Activity: Not on file  Stress: Not on file  Social Connections: Not on file  Intimate Partner Violence: Not on file    FAMILY HISTORY: No family history on file.  ALLERGIES:  has no known allergies.  MEDICATIONS:  Current Outpatient Medications  Medication Sig Dispense Refill   acetaminophen  (TYLENOL )  325 MG tablet Take 650 mg by mouth every 6 (six) hours as needed (for pain.).     aspirin  EC 81 MG tablet Take 1 tablet (81 mg total) by mouth daily. Swallow whole. 30 tablet 12   b complex vitamins capsule Take 1 capsule by mouth daily. 30 capsule 11   calcium carbonate (TUMS - DOSED IN MG ELEMENTAL CALCIUM) 500 MG chewable tablet Chew 1 tablet by mouth daily.     cholecalciferol (VITAMIN D3) 25 MCG (1000 UNIT) tablet Take 5,000 Units by mouth daily.     doxazosin (CARDURA) 8 MG tablet Take 8 mg by mouth daily in the afternoon.     pravastatin  (PRAVACHOL ) 40 MG tablet Take 40 mg by mouth daily before supper.     finasteride  (PROSCAR ) 5 MG tablet Take 5 mg by mouth daily in the afternoon. (Patient not taking: Reported on 12/02/2023)     No current facility-administered medications for this visit.    REVIEW OF SYSTEMS:    10 Point review of Systems was done is negative except as noted above.   PHYSICAL EXAMINATION:   .BP 132/64 (BP Location: Left Arm, Patient Position: Sitting)   Pulse 67   Temp 99 F (37.2 C) (Temporal)   Resp 17   Wt 192 lb 14.4 oz (87.5 kg)   SpO2 100%   BMI 26.90 kg/m   GENERAL:alert, in no acute distress and comfortable SKIN: no acute rashes, no significant lesions EYES: conjunctiva are pink and non-injected, sclera anicteric OROPHARYNX: MMM, no exudates, no oropharyngeal erythema or ulceration NECK: supple, no JVD LYMPH:  no palpable lymphadenopathy in the cervical, axillary or inguinal regions LUNGS: clear to auscultation b/l with normal respiratory effort HEART: regular rate & rhythm ABDOMEN:  normoactive bowel sounds , non tender, not distended. Extremity: no pedal edema PSYCH: alert & oriented x 3 with fluent speech NEURO: no focal motor/sensory deficits    LABORATORY DATA:      Latest Ref Rng & Units 11/18/2023    1:09 PM 09/01/2023    1:19 PM 05/26/2023   12:57 PM  CBC  WBC 4.0 - 10.5 K/uL 3.4  5.0  3.9   Hemoglobin 13.0 - 17.0 g/dL 16.1  09.6   04.5   Hematocrit 39.0 - 52.0 % 38.7  39.0  37.3   Platelets 150 - 400 K/uL 113  125  92        Latest Ref Rng & Units 11/18/2023    1:09 PM 09/01/2023    1:19 PM 05/26/2023   12:57 PM  CMP  Glucose 70 - 99 mg/dL 409  811  89   BUN 8 - 23 mg/dL 11  10  11    Creatinine 0.61 - 1.24 mg/dL 9.14  7.82  9.56   Sodium 135 - 145 mmol/L 137  135  136   Potassium 3.5 - 5.1 mmol/L 4.1  4.2  4.0   Chloride 98 - 111 mmol/L 101  100  101   CO2 22 - 32 mmol/L 31  29  30    Calcium 8.9 - 10.3 mg/dL 9.6  9.4  9.4   Total Protein 6.5 - 8.1 g/dL 7.1  7.0  6.8   Total Bilirubin 0.0 - 1.2 mg/dL 1.1  0.9  0.9   Alkaline Phos 38 - 126 U/L 43  43  40   AST 15 - 41 U/L 21  20  18    ALT 0 - 44 U/L 14  17  16      Myeloma labs on 09/26/2021 within normal limits  ,not observed M protein   Component     Latest Ref Rng & Units 08/28/2021  IgG (Immunoglobin G), Serum     603 - 1,613 mg/dL 161  IgA     61 - 096 mg/dL 045  IgM (Immunoglobulin M), Srm     15 - 143 mg/dL 28  Total Protein ELP     6.0 - 8.5 g/dL 6.5  Albumin SerPl Elph-Mcnc     2.9 - 4.4 g/dL 3.8  Alpha 1     0.0 - 0.4 g/dL 0.2  Alpha2 Glob SerPl Elph-Mcnc     0.4 - 1.0 g/dL 0.7  B-Globulin SerPl Elph-Mcnc     0.7 - 1.3 g/dL 0.8  Gamma Glob SerPl Elph-Mcnc     0.4 - 1.8 g/dL 0.9  M Protein SerPl Elph-Mcnc     Not Observed g/dL Not Observed  Globulin, Total     2.2 - 3.9 g/dL 2.7  Albumin/Glob SerPl     0.7 - 1.7 1.5  IFE 1      Comment  Please Note (HCV):      Comment  Kappa free light chain     3.3 - 19.4 mg/L 39.2 (H)  Lambda free light chains     5.7 - 26.3 mg/L 28.7 (H)  Kappa, lambda light chain ratio     0.26 - 1.65 1.37   05/18/2021: PET SCAN Interval resolution of the hypermetabolism seen previously in the posterior left iliac bone. 2. Tiny hypermetabolic focus in the region of the left adrenal gland without nodule or mass lesion evident on CT imaging. Close follow-up recommended. 3. Otherwise no new or  suspicious hypermetabolism on today's study. 4.  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 Prostatomegaly  06/01/2021: Bone Marrow Biopsy: Surgical Pathology  CASE: [id number]  PATIENT: Jamontae Waddell  Bone Marrow Report    Clinical History: History of multiple myeloma , (BH)   DIAGNOSIS:   BONE MARROW, ASPIRATE, CLOT, CORE:  -Hypercellular bone marrow for age with trilineage hematopoiesis and 1%  plasma cells  -See comment   PERIPHERAL BLOOD:  -Pancytopenia   COMMENT:   The bone marrow is hypercellular with trilineage hematopoiesis and nonspecific myeloid changes likely related to therapy.  In this  background, the plasma cells represent 1% of all cells in the aspirate associated with scattering of interstitial cells in the clot and biopsy sections with lack of large aggregates or sheets. The plasma cells display polyclonal staining pattern for kappa and lambda light chains and hence, there is no definitive or diagnostic evidence of residual plasma cell neoplasm.  Correlation with cytogenetic and FISH studies is recommended.   CLOT AND BIOPSY: The sections show cellularity ranging from 20 to 70%  with a mixture of myeloid cell types.  Large clusters or sheets of plasma cells are not identified.  Significant lymphoid aggregates are  not seen.  Immunohistochemical stain for CD138 and in situ hybridization  for kappa and lambda were performed on blocks B1 and C1 with appropriate  controls.  CD138 highlights an extremely minor plasma cell component mostly consisting of scattering of interstitial cells and displays  polyclonal staining pattern for kappa and lambda light chains.  Bone Marrow count performed on 500 cells shows:  Blasts:   0%   Myeloid:  39%  Promyelocytes: 0%   Erythroid:     52%  Myelocytes:    6%   Lymphocytes:   6%  Metamyelocytes:     3%   Plasma cells:  1%  Bands:    11%  Neutrophils:   14%  M:E ratio:     0.75  Eosinophils:   5%  Basophils:     0%  Monocytes:      2%   Lab Data: CBC performed on 06/01/21 shows:  WBC: 2.2 k/uL  Neutrophils:   56%  Hgb: 10.4 g/dL Lymphocytes:   16%  HCT: 31.0 %    Monocytes:     12%  MCV: 94.8 fL   Eosinophils:   4%  RDW: 14.2 %    Basophils:     2%  PLT: 135 k/uL     ASSESSMENT & PLAN:   78 yo male with    1) Recurrent/persistent hematuria bleeding after prostate surgery. - resolved No fhx or previous personal h/o bleeding issues.  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 ANC 2000  4) Mild chronic kidney disease  5) lower extremity tingling numbness and intermittent purple discoloration rule out peripheral arterial disease Ultrasound with ABIs show no overt vascular obstruction.  6) Neuropathy.     PLAN:  -Discussed lab results on 11/18/2023 in detail with patient. CBC showed WBC of 3.4K, hemoglobin of 13.4, and platelets of 113K. -CMP shows stable creatinine 1.36 -myeloma panel 11/18/2023 continued to show undetectable M protein -Kappa/lambda light chain results from 11/18/2023 showed kappa free light chain of 25.3 with K/L ratio 1.23 - Patient's pancytopenia has more or less resolved off Revlimid . -Patient prefers to stay off the maintenance Revlimid  and closely monitor his myeloma labs especially due to the significant cytopenias with the Revlimid  even with dose reductions. - Continue vitamin B complex and vitamin D  replacement  FOLLOW UP:  Labs in 12 weeks RTC with Dr Salomon Cree in 14 weeks   The total time spent in the appointment was 30 minutes* .  All of the patient's questions were answered with apparent satisfaction. The patient knows to call the clinic with any problems, questions or concerns.   Jacquelyn Matt MD MS  AAHIVMS [facility] Hematology/Oncology Physician [facility]  .*Total Encounter Time as defined by the Centers for Medicare and Medicaid Services includes, in addition to the face-to-face time of a patient visit (documented in the note above) non-face-to-face time: obtaining and reviewing outside history, ordering and reviewing medications, tests or procedures, care coordination (communications with other health care professionals or caregivers) and documentation in the medical record.    I,Mitra Faeizi,acting as a Neurosurgeon for Jacquelyn Matt, MD.,have documented all relevant documentation on the behalf of Jacquelyn Matt, MD,as directed by  Jacquelyn Matt, MD while in the presence of Jacquelyn Matt, MD.  .  I have reviewed the above documentation for accuracy and completeness, and I agree with the above. .Kinsey Karch Kishore Annabelle Rexroad MD

## 2023-12-06 ENCOUNTER — Ambulatory Visit (HOSPITAL_BASED_OUTPATIENT_CLINIC_OR_DEPARTMENT_OTHER)
Admission: RE | Admit: 2023-12-06 | Discharge: 2023-12-06 | Disposition: A | Discharge status: Home / Self Care | Source: Ambulatory Visit | Attending: Internal Medicine | Admitting: Internal Medicine

## 2023-12-06 DIAGNOSIS — M7989 Other specified soft tissue disorders: Secondary | ICD-10-CM | POA: Insufficient documentation

## 2023-12-08 ENCOUNTER — Encounter: Payer: Self-pay | Admitting: Hematology

## 2023-12-11 DIAGNOSIS — S80812D Abrasion, left lower leg, subsequent encounter: Secondary | ICD-10-CM | POA: Diagnosis not present

## 2023-12-16 DIAGNOSIS — C9000 Multiple myeloma not having achieved remission: Secondary | ICD-10-CM | POA: Diagnosis not present

## 2023-12-16 DIAGNOSIS — E7800 Pure hypercholesterolemia, unspecified: Secondary | ICD-10-CM | POA: Diagnosis not present

## 2023-12-18 DIAGNOSIS — C9000 Multiple myeloma not having achieved remission: Secondary | ICD-10-CM | POA: Diagnosis not present

## 2023-12-18 DIAGNOSIS — S80812D Abrasion, left lower leg, subsequent encounter: Secondary | ICD-10-CM | POA: Diagnosis not present

## 2023-12-18 DIAGNOSIS — M6530 Trigger finger, unspecified finger: Secondary | ICD-10-CM | POA: Diagnosis not present

## 2023-12-18 DIAGNOSIS — E7800 Pure hypercholesterolemia, unspecified: Secondary | ICD-10-CM | POA: Diagnosis not present

## 2024-01-20 DIAGNOSIS — M65331 Trigger finger, right middle finger: Secondary | ICD-10-CM | POA: Diagnosis not present

## 2024-01-20 DIAGNOSIS — M65321 Trigger finger, right index finger: Secondary | ICD-10-CM | POA: Diagnosis not present

## 2024-01-27 DIAGNOSIS — I87392 Chronic venous hypertension (idiopathic) with other complications of left lower extremity: Secondary | ICD-10-CM | POA: Diagnosis not present

## 2024-01-27 DIAGNOSIS — I872 Venous insufficiency (chronic) (peripheral): Secondary | ICD-10-CM | POA: Diagnosis not present

## 2024-02-25 ENCOUNTER — Other Ambulatory Visit: Payer: Self-pay

## 2024-02-25 ENCOUNTER — Inpatient Hospital Stay: Attending: Hematology

## 2024-02-25 DIAGNOSIS — C9000 Multiple myeloma not having achieved remission: Secondary | ICD-10-CM | POA: Diagnosis not present

## 2024-02-25 LAB — CBC WITH DIFFERENTIAL (CANCER CENTER ONLY)
Abs Immature Granulocytes: 0.01 K/uL (ref 0.00–0.07)
Basophils Absolute: 0 K/uL (ref 0.0–0.1)
Basophils Relative: 0 %
Eosinophils Absolute: 0.1 K/uL (ref 0.0–0.5)
Eosinophils Relative: 1 %
HCT: 38.3 % — ABNORMAL LOW (ref 39.0–52.0)
Hemoglobin: 13.7 g/dL (ref 13.0–17.0)
Immature Granulocytes: 0 %
Lymphocytes Relative: 35 %
Lymphs Abs: 1.4 K/uL (ref 0.7–4.0)
MCH: 30.9 pg (ref 26.0–34.0)
MCHC: 35.8 g/dL (ref 30.0–36.0)
MCV: 86.5 fL (ref 80.0–100.0)
Monocytes Absolute: 0.4 K/uL (ref 0.1–1.0)
Monocytes Relative: 9 %
Neutro Abs: 2.3 K/uL (ref 1.7–7.7)
Neutrophils Relative %: 55 %
Platelet Count: 108 K/uL — ABNORMAL LOW (ref 150–400)
RBC: 4.43 MIL/uL (ref 4.22–5.81)
RDW: 13 % (ref 11.5–15.5)
WBC Count: 4.1 K/uL (ref 4.0–10.5)
nRBC: 0 % (ref 0.0–0.2)

## 2024-02-25 LAB — CMP (CANCER CENTER ONLY)
ALT: 17 U/L (ref 0–44)
AST: 21 U/L (ref 15–41)
Albumin: 4.3 g/dL (ref 3.5–5.0)
Alkaline Phosphatase: 43 U/L (ref 38–126)
Anion gap: 6 (ref 5–15)
BUN: 9 mg/dL (ref 8–23)
CO2: 29 mmol/L (ref 22–32)
Calcium: 9.5 mg/dL (ref 8.9–10.3)
Chloride: 98 mmol/L (ref 98–111)
Creatinine: 1.43 mg/dL — ABNORMAL HIGH (ref 0.61–1.24)
GFR, Estimated: 50 mL/min — ABNORMAL LOW (ref >60)
Glucose, Bld: 98 mg/dL (ref 70–99)
Potassium: 4.1 mmol/L (ref 3.5–5.1)
Sodium: 133 mmol/L — ABNORMAL LOW (ref 135–145)
Total Bilirubin: 1.1 mg/dL (ref 0.0–1.2)
Total Protein: 7 g/dL (ref 6.5–8.1)

## 2024-02-26 LAB — KAPPA/LAMBDA LIGHT CHAINS
Kappa free light chain: 21.7 mg/L — ABNORMAL HIGH (ref 3.3–19.4)
Kappa, lambda light chain ratio: 1.03 (ref 0.26–1.65)
Lambda free light chains: 21.1 mg/L (ref 5.7–26.3)

## 2024-02-27 LAB — MULTIPLE MYELOMA PANEL, SERUM
Albumin SerPl Elph-Mcnc: 4 g/dL (ref 2.9–4.4)
Albumin/Glob SerPl: 1.7 (ref 0.7–1.7)
Alpha 1: 0.2 g/dL (ref 0.0–0.4)
Alpha2 Glob SerPl Elph-Mcnc: 0.6 g/dL (ref 0.4–1.0)
B-Globulin SerPl Elph-Mcnc: 0.8 g/dL (ref 0.7–1.3)
Gamma Glob SerPl Elph-Mcnc: 0.9 g/dL (ref 0.4–1.8)
Globulin, Total: 2.4 g/dL (ref 2.2–3.9)
IgA: 206 mg/dL (ref 61–437)
IgG (Immunoglobin G), Serum: 982 mg/dL (ref 603–1613)
IgM (Immunoglobulin M), Srm: 39 mg/dL (ref 15–143)
Total Protein ELP: 6.4 g/dL (ref 6.0–8.5)

## 2024-03-10 ENCOUNTER — Inpatient Hospital Stay: Attending: Hematology | Admitting: Hematology

## 2024-03-10 VITALS — BP 123/74 | HR 58 | Temp 97.7°F | Resp 20 | Wt 192.7 lb

## 2024-03-10 DIAGNOSIS — C9000 Multiple myeloma not having achieved remission: Secondary | ICD-10-CM | POA: Insufficient documentation

## 2024-03-10 DIAGNOSIS — D61818 Other pancytopenia: Secondary | ICD-10-CM | POA: Insufficient documentation

## 2024-03-10 DIAGNOSIS — N182 Chronic kidney disease, stage 2 (mild): Secondary | ICD-10-CM | POA: Insufficient documentation

## 2024-03-10 DIAGNOSIS — Z7982 Long term (current) use of aspirin: Secondary | ICD-10-CM | POA: Diagnosis not present

## 2024-03-10 DIAGNOSIS — Z87891 Personal history of nicotine dependence: Secondary | ICD-10-CM | POA: Insufficient documentation

## 2024-03-10 DIAGNOSIS — N400 Enlarged prostate without lower urinary tract symptoms: Secondary | ICD-10-CM | POA: Insufficient documentation

## 2024-03-10 DIAGNOSIS — Z79899 Other long term (current) drug therapy: Secondary | ICD-10-CM | POA: Diagnosis not present

## 2024-03-10 DIAGNOSIS — Z5111 Encounter for antineoplastic chemotherapy: Secondary | ICD-10-CM

## 2024-03-16 ENCOUNTER — Encounter: Payer: Self-pay | Admitting: Hematology

## 2024-03-16 NOTE — Progress Notes (Signed)
 HEMATOLOGY/ONCOLOGY CLINIC VISIT NOTE  DOS. SABRA08/13/2025  Patient Care Team: Leonel Cole, MD as PCP - General (Family Medicine)  CHIEF COMPLAINTS/PURPOSE OF VISIT:  For continued evaluation and management of multiple myeloma  HISTORY OF PRESENTING ILLNESS:  Please see previous notes for details on initial presentation.  INTERVAL HISTORY  Brian Levine is a 78 y.o. 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 He notes no acute new symptoms since his last clinic visit. No new focal bone pain.  No new fatigue chest pain or shortness of breath.  No abnormal bleeding or bruising. No fevers no chills no night sweats no unexpected weight loss.  MEDICAL HISTORY:  Past Medical History:  Diagnosis Date   BPH (benign prostatic hyperplasia) 2004   Constipation    Other pancytopenia (HCC) 05/04/2013   Thrombocytopenia, unspecified (HCC) 08/17/2013   Weight loss 05/04/2013    SURGICAL HISTORY: Past Surgical History:  Procedure Laterality Date   APPENDECTOMY     COLONOSCOPY WITH PROPOFOL  N/A 07/17/2015   Procedure: COLONOSCOPY WITH PROPOFOL ;  Surgeon: Gladis MARLA Louder, MD;  Location: [facility];  Service: Endoscopy;  Laterality: N/A;   CYSTOSCOPY WITH FULGERATION N/A 11/08/2020   Procedure: CYSTOSCOPY WITH FULGERATION OF BLEEDERS;  Surgeon: Watt Rush, MD;  Location: [facility];  Service: Urology;  Laterality: N/A;   CYSTOSCOPY WITH FULGERATION N/A 12/01/2020   Procedure: CYSTOSCOPY WITH FULGERATION OF  PROSTATE;  Surgeon: Watt Rush, MD;  Location: [facility];  Service: Urology;  Laterality: N/A;   CYSTOSCOPY WITH INSERTION OF UROLIFT N/A 08/22/2015   Procedure: CYSTOSCOPY WITH INSERTION OF UROLIFT;  Surgeon: Rush Watt, MD;  Location: [facility];  Service: Urology;  Laterality: N/A;   CYSTOSCOPY WITH LITHOLAPAXY N/A 10/24/2020   Procedure: CYSTOSCOPY WITH LITHOLAPAXY;  Surgeon: Watt Rush, MD;  Location: [facility];  Service: Urology;  Laterality: N/A;  REQUESTING 1 HR FOR CASE   EYE SURGERY     right cataract surgery with lens implant   TESTICLE SURGERY Right 2011   hydrocele   TRANSURETHRAL RESECTION OF PROSTATE N/A 10/24/2020   Procedure: TRANSURETHRAL RESECTION OF THE PROSTATE (TURP);  Surgeon: Watt Rush, MD;  Location: [facility];  Service: Urology;  Laterality: N/A;    SOCIAL HISTORY: Social History   Socioeconomic History   Marital status: Married    Spouse name: Not on file   Number of children: Not on file   Years of education: Not on file   Highest education level: Not on file  Occupational History   Not on file  Tobacco Use   Smoking status: Former    Current packs/day: 0.00    Average packs/day: 1.5 packs/day for 30.0 years (45.0 ttl pk-yrs)    Types: Cigarettes    Start date: 07/29/1965    Quit date: 07/30/1995    Years since quitting: 28.6   Smokeless tobacco: Never  Vaping Use   Vaping status: Never Used  Substance and Sexual Activity   Alcohol use: No   Drug use: No   Sexual activity: Not on file  Other Topics Concern   Not on file  Social History Narrative   Not on file   Social Drivers of Health   Financial Resource Strain: Not on file  Food Insecurity: No Food Insecurity (05/09/2022)   Hunger Vital Sign    Worried About Running Out of Food in the Last Year: Never true  Ran Out of Food in the Last Year: Never true  Transportation Needs: No Transportation Needs (05/09/2022)   PRAPARE - Administrator, Civil Service (Medical): No    Lack of Transportation (Non-Medical): No  Physical Activity: Not on file  Stress: Not on file  Social Connections: Not on file  Intimate Partner Violence: Not on file    FAMILY HISTORY: No family history on file.  ALLERGIES:  has no known allergies.  MEDICATIONS:  Current Outpatient Medications  Medication Sig Dispense Refill   acetaminophen  (TYLENOL ) 325 MG tablet Take 650 mg by mouth every 6 (six) hours as  needed (for pain.).     aspirin  EC 81 MG tablet Take 1 tablet (81 mg total) by mouth daily. Swallow whole. 30 tablet 12   b complex vitamins capsule Take 1 capsule by mouth daily. 30 capsule 11   calcium carbonate (TUMS - DOSED IN MG ELEMENTAL CALCIUM) 500 MG chewable tablet Chew 1 tablet by mouth daily.     cholecalciferol (VITAMIN D3) 25 MCG (1000 UNIT) tablet Take 5,000 Units by mouth daily.     doxazosin (CARDURA) 8 MG tablet Take 8 mg by mouth daily in the afternoon.     finasteride  (PROSCAR ) 5 MG tablet Take 5 mg by mouth daily in the afternoon. (Patient not taking: Reported on 12/02/2023)     pravastatin  (PRAVACHOL ) 40 MG tablet Take 40 mg by mouth daily before supper.     No current facility-administered medications for this visit.    REVIEW OF SYSTEMS:    .10 Point review of Systems was done is negative except as noted above.  PHYSICAL EXAMINATION:   .BP 123/74   Pulse (!) 58   Temp 97.7 F (36.5 C)   Resp 20   Wt 192 lb 11.2 oz (87.4 kg)   SpO2 100%   BMI 26.88 kg/m   . GENERAL:alert, in no acute distress and comfortable SKIN: no acute rashes, no significant lesions EYES: conjunctiva are pink and non-injected, sclera anicteric OROPHARYNX: MMM, no exudates, no oropharyngeal erythema or ulceration NECK: supple, no JVD LYMPH:  no palpable lymphadenopathy in the cervical, axillary or inguinal regions LUNGS: clear to auscultation b/l with normal respiratory effort HEART: regular rate & rhythm ABDOMEN:  normoactive bowel sounds , non tender, not distended. Extremity: no pedal edema PSYCH: alert & oriented x 3 with fluent speech NEURO: no focal motor/sensory deficits  LABORATORY DATA:      Latest Ref Rng & Units 02/25/2024    1:09 PM 11/18/2023    1:09 PM 09/01/2023    1:19 PM  CBC  WBC 4.0 - 10.5 K/uL 4.1  3.4  5.0   Hemoglobin 13.0 - 17.0 g/dL 86.2  86.5  86.5   Hematocrit 39.0 - 52.0 % 38.3  38.7  39.0   Platelets 150 - 400 K/uL 108  113  125        Latest Ref  Rng & Units 02/25/2024    1:09 PM 11/18/2023    1:09 PM 09/01/2023    1:19 PM  CMP  Glucose 70 - 99 mg/dL 98  871  892   BUN 8 - 23 mg/dL 9  11  10    Creatinine 0.61 - 1.24 mg/dL 8.56  8.63  8.60   Sodium 135 - 145 mmol/L 133  137  135   Potassium 3.5 - 5.1 mmol/L 4.1  4.1  4.2   Chloride 98 - 111 mmol/L 98  101  100   CO2  22 - 32 mmol/L 29  31  29    Calcium 8.9 - 10.3 mg/dL 9.5  9.6  9.4   Total Protein 6.5 - 8.1 g/dL 7.0  7.1  7.0   Total Bilirubin 0.0 - 1.2 mg/dL 1.1  1.1  0.9   Alkaline Phos 38 - 126 U/L 43  43  43   AST 15 - 41 U/L 21  21  20    ALT 0 - 44 U/L 17  14  17      Myeloma labs on 09/26/2021 within normal limits  ,not observed M protein   Component     Latest Ref Rng & Units 08/28/2021  IgG (Immunoglobin G), Serum     603 - 1,613 mg/dL 093  IgA     61 - 562 mg/dL 781  IgM (Immunoglobulin M), Srm     15 - 143 mg/dL 28  Total Protein ELP     6.0 - 8.5 g/dL 6.5  Albumin SerPl Elph-Mcnc     2.9 - 4.4 g/dL 3.8  Alpha 1     0.0 - 0.4 g/dL 0.2  Alpha2 Glob SerPl Elph-Mcnc     0.4 - 1.0 g/dL 0.7  B-Globulin SerPl Elph-Mcnc     0.7 - 1.3 g/dL 0.8  Gamma Glob SerPl Elph-Mcnc     0.4 - 1.8 g/dL 0.9  M Protein SerPl Elph-Mcnc     Not Observed g/dL Not Observed  Globulin, Total     2.2 - 3.9 g/dL 2.7  Albumin/Glob SerPl     0.7 - 1.7 1.5  IFE 1      Comment  Please Note (HCV):      Comment  Kappa free light chain     3.3 - 19.4 mg/L 39.2 (H)  Lambda free light chains     5.7 - 26.3 mg/L 28.7 (H)  Kappa, lambda light chain ratio     0.26 - 1.65 1.37   05/18/2021: PET SCAN Interval resolution of the hypermetabolism seen previously in the posterior left iliac bone. 2. Tiny hypermetabolic focus in the region of the left adrenal gland without nodule or mass lesion evident on CT imaging. Close follow-up recommended. 3. Otherwise no new or suspicious hypermetabolism on today's study. 4.  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 Prostatomegaly  06/01/2021: Bone  Marrow Biopsy: Surgical Pathology  CASE: [id number]  PATIENT: Brian Levine  Bone Marrow Report    Clinical History: History of multiple myeloma , (BH)   DIAGNOSIS:   BONE MARROW, ASPIRATE, CLOT, CORE:  -Hypercellular bone marrow for age with trilineage hematopoiesis and 1%  plasma cells  -See comment   PERIPHERAL BLOOD:  -Pancytopenia   COMMENT:   The bone marrow is hypercellular with trilineage hematopoiesis and nonspecific myeloid changes likely related to therapy.  In this  background, the plasma cells represent 1% of all cells in the aspirate associated with scattering of interstitial cells in the clot and biopsy sections with lack of large aggregates or sheets. The plasma cells display polyclonal staining pattern for kappa and lambda light chains and hence, there is no definitive or diagnostic evidence of residual plasma cell neoplasm.  Correlation with cytogenetic and FISH studies is recommended.   CLOT AND BIOPSY: The sections show cellularity ranging from 20 to 70%  with a mixture of myeloid cell types.  Large clusters or sheets of plasma cells are not identified.  Significant lymphoid aggregates are  not seen.  Immunohistochemical stain for CD138 and in situ hybridization  for kappa and lambda  were performed on blocks B1 and C1 with appropriate  controls.  CD138 highlights an extremely minor plasma cell component mostly consisting of scattering of interstitial cells and displays  polyclonal staining pattern for kappa and lambda light chains.  Bone Marrow count performed on 500 cells shows:  Blasts:   0%   Myeloid:  39%  Promyelocytes: 0%   Erythroid:     52%  Myelocytes:    6%   Lymphocytes:   6%  Metamyelocytes:     3%   Plasma cells:  1%  Bands:    11%  Neutrophils:   14%  M:E ratio:     0.75  Eosinophils:   5%  Basophils:     0%  Monocytes:     2%   Lab Data: CBC performed on 06/01/21 shows:  WBC: 2.2 k/uL  Neutrophils:   56%  Hgb: 10.4 g/dL Lymphocytes:   73%   HCT: 31.0 %    Monocytes:     12%  MCV: 94.8 fL   Eosinophils:   4%  RDW: 14.2 %    Basophils:     2%  PLT: 135 k/uL     ASSESSMENT & PLAN:   78 yo male with    1) Recurrent/persistent hematuria bleeding after prostate surgery. - resolved No fhx or previous personal h/o bleeding issues.  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 ANC 2000  4) Mild chronic kidney disease  5) lower extremity tingling numbness and intermittent purple discoloration rule out peripheral arterial disease Ultrasound with ABIs show no overt vascular obstruction.  6) Neuropathy.     PLAN:  -Discussed lab results from 02/25/2024 CBC is stable with normal hemoglobin of 13.7, normal WBC count of 4.1k and mild thrombocytopenia with platelets of 108k. CMP shows stable chronic kidney disease creatinine of 1.43 Myeloma panel shows no monoclonal protein spike with negative IFE Kappa lambda free light chains are within normal limits with a normal kappa lambda ratio of 1.03 Patient has no lab or clinical evidence of myeloma progression at this time. he has chosen to stay off maintenance Revlimid  due to significant cytopenias despite dose reductions. He notes no acute new symptoms since his last clinic visit. - Continue vitamin B complex and vitamin D  replacement - PCP to stay up to speed with age-appropriate vaccinations especially in the context of immunosuppression.  FOLLOW UP:  Labs on 14 weeks Phone visit Dr Onesimo in 16 weeks   The total time spent in the appointment was 25 minutes*.  All of the patient's questions were answered with apparent satisfaction. The patient knows to call the clinic with any problems, questions or  concerns.   Emaline Onesimo MD MS AAHIVMS [facility] Hematology/Oncology Physician [facility]  .*Total Encounter Time as defined by the Centers for Medicare and Medicaid Services includes, in addition to the face-to-face time of a patient visit (documented in the note above) non-face-to-face time: obtaining and reviewing outside history, ordering and reviewing medications, tests or procedures, care coordination (communications with other health care professionals or caregivers) and documentation in the medical record.

## 2024-03-24 DIAGNOSIS — Z03818 Encounter for observation for suspected exposure to other biological agents ruled out: Secondary | ICD-10-CM | POA: Diagnosis not present

## 2024-03-24 DIAGNOSIS — J988 Other specified respiratory disorders: Secondary | ICD-10-CM | POA: Diagnosis not present

## 2024-03-24 DIAGNOSIS — R051 Acute cough: Secondary | ICD-10-CM | POA: Diagnosis not present

## 2024-05-15 DIAGNOSIS — Z23 Encounter for immunization: Secondary | ICD-10-CM | POA: Diagnosis not present

## 2024-06-15 ENCOUNTER — Other Ambulatory Visit: Payer: Self-pay

## 2024-06-15 DIAGNOSIS — C9000 Multiple myeloma not having achieved remission: Secondary | ICD-10-CM

## 2024-06-16 ENCOUNTER — Inpatient Hospital Stay: Attending: Hematology

## 2024-06-16 DIAGNOSIS — C9000 Multiple myeloma not having achieved remission: Secondary | ICD-10-CM | POA: Diagnosis not present

## 2024-06-16 LAB — CBC WITH DIFFERENTIAL (CANCER CENTER ONLY)
Abs Immature Granulocytes: 0 K/uL (ref 0.00–0.07)
Basophils Absolute: 0 K/uL (ref 0.0–0.1)
Basophils Relative: 1 %
Eosinophils Absolute: 0.1 K/uL (ref 0.0–0.5)
Eosinophils Relative: 2 %
HCT: 39.2 % (ref 39.0–52.0)
Hemoglobin: 13.7 g/dL (ref 13.0–17.0)
Immature Granulocytes: 0 %
Lymphocytes Relative: 34 %
Lymphs Abs: 1.4 K/uL (ref 0.7–4.0)
MCH: 30.5 pg (ref 26.0–34.0)
MCHC: 34.9 g/dL (ref 30.0–36.0)
MCV: 87.3 fL (ref 80.0–100.0)
Monocytes Absolute: 0.3 K/uL (ref 0.1–1.0)
Monocytes Relative: 7 %
Neutro Abs: 2.3 K/uL (ref 1.7–7.7)
Neutrophils Relative %: 56 %
Platelet Count: 131 K/uL — ABNORMAL LOW (ref 150–400)
RBC: 4.49 MIL/uL (ref 4.22–5.81)
RDW: 12.9 % (ref 11.5–15.5)
WBC Count: 4.2 K/uL (ref 4.0–10.5)
nRBC: 0 % (ref 0.0–0.2)

## 2024-06-16 LAB — CMP (CANCER CENTER ONLY)
ALT: 17 U/L (ref 0–44)
AST: 26 U/L (ref 15–41)
Albumin: 4.4 g/dL (ref 3.5–5.0)
Alkaline Phosphatase: 54 U/L (ref 38–126)
Anion gap: 10 (ref 5–15)
BUN: 11 mg/dL (ref 8–23)
CO2: 27 mmol/L (ref 22–32)
Calcium: 9.7 mg/dL (ref 8.9–10.3)
Chloride: 99 mmol/L (ref 98–111)
Creatinine: 1.32 mg/dL — ABNORMAL HIGH (ref 0.61–1.24)
GFR, Estimated: 55 mL/min — ABNORMAL LOW (ref >60)
Glucose, Bld: 119 mg/dL — ABNORMAL HIGH (ref 70–99)
Potassium: 4 mmol/L (ref 3.5–5.1)
Sodium: 136 mmol/L (ref 135–145)
Total Bilirubin: 0.9 mg/dL (ref 0.0–1.2)
Total Protein: 7.1 g/dL (ref 6.5–8.1)

## 2024-06-17 LAB — KAPPA/LAMBDA LIGHT CHAINS
Kappa free light chain: 25.9 mg/L — ABNORMAL HIGH (ref 3.3–19.4)
Kappa, lambda light chain ratio: 1.08 (ref 0.26–1.65)
Lambda free light chains: 24 mg/L (ref 5.7–26.3)

## 2024-06-18 DIAGNOSIS — C9000 Multiple myeloma not having achieved remission: Secondary | ICD-10-CM | POA: Diagnosis not present

## 2024-06-18 DIAGNOSIS — N400 Enlarged prostate without lower urinary tract symptoms: Secondary | ICD-10-CM | POA: Diagnosis not present

## 2024-06-18 DIAGNOSIS — E7800 Pure hypercholesterolemia, unspecified: Secondary | ICD-10-CM | POA: Diagnosis not present

## 2024-06-19 LAB — MULTIPLE MYELOMA PANEL, SERUM
Albumin SerPl Elph-Mcnc: 3.8 g/dL (ref 2.9–4.4)
Albumin/Glob SerPl: 1.3 (ref 0.7–1.7)
Alpha 1: 0.2 g/dL (ref 0.0–0.4)
Alpha2 Glob SerPl Elph-Mcnc: 0.7 g/dL (ref 0.4–1.0)
B-Globulin SerPl Elph-Mcnc: 1 g/dL (ref 0.7–1.3)
Gamma Glob SerPl Elph-Mcnc: 1.2 g/dL (ref 0.4–1.8)
Globulin, Total: 3.1 g/dL (ref 2.2–3.9)
IgA: 227 mg/dL (ref 61–437)
IgG (Immunoglobin G), Serum: 1150 mg/dL (ref 603–1613)
IgM (Immunoglobulin M), Srm: 48 mg/dL (ref 15–143)
Total Protein ELP: 6.9 g/dL (ref 6.0–8.5)

## 2024-06-21 DIAGNOSIS — C9001 Multiple myeloma in remission: Secondary | ICD-10-CM | POA: Diagnosis not present

## 2024-06-21 DIAGNOSIS — Z1211 Encounter for screening for malignant neoplasm of colon: Secondary | ICD-10-CM | POA: Diagnosis not present

## 2024-06-21 DIAGNOSIS — D696 Thrombocytopenia, unspecified: Secondary | ICD-10-CM | POA: Diagnosis not present

## 2024-06-21 DIAGNOSIS — G629 Polyneuropathy, unspecified: Secondary | ICD-10-CM | POA: Diagnosis not present

## 2024-06-21 DIAGNOSIS — D61818 Other pancytopenia: Secondary | ICD-10-CM | POA: Diagnosis not present

## 2024-06-21 DIAGNOSIS — E7800 Pure hypercholesterolemia, unspecified: Secondary | ICD-10-CM | POA: Diagnosis not present

## 2024-06-21 DIAGNOSIS — Z0000 Encounter for general adult medical examination without abnormal findings: Secondary | ICD-10-CM | POA: Diagnosis not present

## 2024-06-21 DIAGNOSIS — N400 Enlarged prostate without lower urinary tract symptoms: Secondary | ICD-10-CM | POA: Diagnosis not present

## 2024-06-30 ENCOUNTER — Inpatient Hospital Stay: Attending: Hematology | Admitting: Hematology

## 2024-06-30 DIAGNOSIS — C9000 Multiple myeloma not having achieved remission: Secondary | ICD-10-CM | POA: Diagnosis not present

## 2024-06-30 NOTE — Progress Notes (Signed)
 HEMATOLOGY ONCOLOGY PROGRESS NOTE  Date of service: 06/30/2024  Patient Care Team: Leonel Cole, MD as PCP - General (Family Medicine)  CHIEF COMPLAINT/PURPOSE OF CONSULTATION: Follow-up for continued evaluation and management of Multiple Myeloma  HISTORY OF PRESENTING ILLNESS: (12/19/2020) Brian Levine is a wonderful 78 y.o. male who has been referred to us  by [facility] for evaluation and management of bleeding from the prostate following a turp procedure. The pt reports that he is doing well overall.   The pt reports that he has had continued prostate bleeding following a TUR procedure on March 29. He had 2-3 weeks of continued bleeding before any procedure where they cauterized leaking blood vessels multiple times now on April 13 and May 6. He was given ddAVP once to try and stop the bleeding. The pt is still bleeding even after this. The pt denies any history of excessive bleeding after surgery or any familial history of bleeding issues. He has never experienced bleeding like this in his life prior or with any other surgeries he has had. He has never experienced any abnormal bruising nor has ever been on any blood thinners. He took a Baby ASA for a short period of time, but not except many years ago. The pt has no unusual food preferences nor was there nay concern for liver issues or problems with his liver. Outside of the surgery, the pt denies any bleeding issues or spontaneous bruising.    The pt notes he had a UroLift five years ago prior to getting the turp procedure recently. He notes he currently has visible urine in his blood that starts as a bright red color and is highly visible. This dilutes out as more urine comes out. He notes that he has now had to go to the restroom 3-4 times each night, uncommon and not his baseline activity. The pt notes that he had a catheter for 3-4 days twice, but this caused no changes and the blood was just in the bag. The first cauterization lasted 5  days without bleeding and the second lasted 7 days. The pt does not currently see any blood clots after seeing them until the second cauterization./   Lab results 12/15/2020 of CBC w/diff is as follows: all values are WNL except for WBC of 4.2K, RBC of 4.11, Hgb of 13.0, HCT of 38.1, Plt of 146K. 12/15/2020 PTT of 27.3.   On review of systems, pt reports initial and terminal hematuria, frequent urination during the night and denies infection issues, leg swelling, abdominal pain, back pain, and any other symptoms.  SUMMARY OF ONCOLOGIC HISTORY: Oncology History  Multiple myeloma (HCC)  01/30/2021 Initial Diagnosis   Multiple myeloma (HCC)   02/06/2021 - 05/16/2021 Chemotherapy   Patient is on Treatment Plan : MYELOMA NEWLY DIAGNOSED TRANSPLANT CANDIDATE Carfilzomib  (20/36)/ Lenalidomide  / Dexamethasone  (40/20) (KRd) q28d       INTERVAL HISTORY: I connected with Brian Levine on 06/30/2024 at  3:30 PM EST by telephone and verified that I am speaking with the correct person using two identifiers.   I discussed the limitations, risks, security and privacy concerns of performing an evaluation and management service by telemedicine and the availability of in-person appointments. I also discussed with the patient that there may be a patient responsible charge related to this service. The patient expressed understanding and agreed to proceed.   Other persons participating in the visit and their role in the encounter: Medical Scribe, Damien Blanks.  Patient's location: Home Provider's location: [facility]  Chief Complaint: continued evaluation and management of Light Chain Multiple Myeloma.  he was last seen by me on 03/10/2024; at the time he did not have any concerns and was doing well.    Today, he says that he's been doing well and does not have any new concerns today. Notes that occasional paresthesia in his toes is bothersome. Denies issues balancing.   REVIEW OF SYSTEMS:   10 Point review of  systems of done and is negative except as noted above.  MEDICAL HISTORY Past Medical History:  Diagnosis Date   BPH (benign prostatic hyperplasia) 2004   Constipation    Other pancytopenia (HCC) 05/04/2013   Thrombocytopenia, unspecified 08/17/2013   Weight loss 05/04/2013    SURGICAL HISTORY Past Surgical History:  Procedure Laterality Date   APPENDECTOMY     COLONOSCOPY WITH PROPOFOL  N/A 07/17/2015   Procedure: COLONOSCOPY WITH PROPOFOL ;  Surgeon: Gladis MARLA Louder, MD;  Location: [facility];  Service: Endoscopy;  Laterality: N/A;   CYSTOSCOPY WITH FULGERATION N/A 11/08/2020   Procedure: CYSTOSCOPY WITH FULGERATION OF BLEEDERS;  Surgeon: Watt Rush, MD;  Location: [facility];  Service: Urology;  Laterality: N/A;   CYSTOSCOPY WITH FULGERATION N/A 12/01/2020   Procedure: CYSTOSCOPY WITH FULGERATION OF  PROSTATE;  Surgeon: Watt Rush, MD;  Location: [facility];  Service: Urology;  Laterality: N/A;   CYSTOSCOPY WITH INSERTION OF UROLIFT N/A 08/22/2015   Procedure: CYSTOSCOPY WITH INSERTION OF UROLIFT;  Surgeon: Rush Watt, MD;  Location: [facility];  Service: Urology;  Laterality: N/A;   CYSTOSCOPY WITH LITHOLAPAXY N/A 10/24/2020   Procedure: CYSTOSCOPY WITH LITHOLAPAXY;  Surgeon: Watt Rush, MD;  Location: [facility];  Service: Urology;  Laterality: N/A;  REQUESTING 1 HR FOR CASE   EYE SURGERY     right cataract surgery with lens implant   TESTICLE SURGERY Right 2011   hydrocele   TRANSURETHRAL RESECTION OF PROSTATE N/A 10/24/2020   Procedure: TRANSURETHRAL RESECTION OF THE PROSTATE (TURP);  Surgeon: Watt Rush, MD;  Location: [facility];  Service: Urology;  Laterality: N/A;    SOCIAL HISTORY Social History   Tobacco Use   Smoking status: Former    Current packs/day: 0.00    Average packs/day: 1.5 packs/day for 30.0 years (45.0 ttl pk-yrs)    Types: Cigarettes    Start date: 07/29/1965    Quit date: 07/30/1995    Years since quitting: 28.9   Smokeless tobacco: Never  Vaping Use   Vaping status:  Never Used  Substance Use Topics   Alcohol use: No   Drug use: No    Social History   Social History Narrative   Not on file    SOCIAL DRIVERS OF HEALTH SDOH Screenings   Food Insecurity: No Food Insecurity (05/09/2022)  Transportation Needs: No Transportation Needs (05/09/2022)  Tobacco Use: Medium Risk (05/29/2022)     FAMILY HISTORY No family history on file.   ALLERGIES: has no known allergies.  MEDICATIONS  Current Outpatient Medications  Medication Sig Dispense Refill   acetaminophen  (TYLENOL ) 325 MG tablet Take 650 mg by mouth every 6 (six) hours as needed (for pain.).     aspirin  EC 81 MG tablet Take 1 tablet (81 mg total) by mouth daily. Swallow whole. 30 tablet 12   b complex vitamins capsule Take 1 capsule by mouth daily. 30 capsule 11   calcium carbonate (TUMS - DOSED IN MG ELEMENTAL CALCIUM) 500 MG chewable tablet Chew 1 tablet by mouth daily.     cholecalciferol (VITAMIN D3) 25 MCG (  1000 UNIT) tablet Take 5,000 Units by mouth daily.     doxazosin (CARDURA) 8 MG tablet Take 8 mg by mouth daily in the afternoon.     finasteride  (PROSCAR ) 5 MG tablet Take 5 mg by mouth daily in the afternoon. (Patient not taking: Reported on 12/02/2023)     pravastatin  (PRAVACHOL ) 40 MG tablet Take 40 mg by mouth daily before supper.     No current facility-administered medications for this visit.    PHYSICAL EXAMINATION TELEPHONE VISIT:  GENERAL: sounds alert, in no acute distress and comfortable PSYCH: sounds alert & oriented x 3 with fluent speech  LABORATORY DATA:   I have reviewed the data as listed     Latest Ref Rng & Units 06/16/2024    1:00 PM 02/25/2024    1:09 PM 11/18/2023    1:09 PM  CBC EXTENDED  WBC 4.0 - 10.5 K/uL 4.2  4.1  3.4   RBC 4.22 - 5.81 MIL/uL 4.49  4.43  4.41   Hemoglobin 13.0 - 17.0 g/dL 86.2  86.2  86.5   HCT 39.0 - 52.0 % 39.2  38.3  38.7   Platelets 150 - 400 K/uL 131  108  113   NEUT# 1.7 - 7.7 K/uL 2.3  2.3  2.0   Lymph# 0.7 - 4.0  K/uL 1.4  1.4  1.1       Latest Ref Rng & Units 06/16/2024    1:00 PM 02/25/2024    1:09 PM 11/18/2023    1:09 PM  CMP  Glucose 70 - 99 mg/dL 880  98  871   BUN 8 - 23 mg/dL 11  9  11    Creatinine 0.61 - 1.24 mg/dL 8.67  8.56  8.63   Sodium 135 - 145 mmol/L 136  133  137   Potassium 3.5 - 5.1 mmol/L 4.0  4.1  4.1   Chloride 98 - 111 mmol/L 99  98  101   CO2 22 - 32 mmol/L 27  29  31    Calcium 8.9 - 10.3 mg/dL 9.7  9.5  9.6   Total Protein 6.5 - 8.1 g/dL 7.1  7.0  7.1   Total Bilirubin 0.0 - 1.2 mg/dL 0.9  1.1  1.1   Alkaline Phos 38 - 126 U/L 54  43  43   AST 15 - 41 U/L 26  21  21    ALT 0 - 44 U/L 17  17  14     MULTIPLE MYELOMA & KAPPA/LAMBDA LIGHT CHAINS 01/2023 - 05/2024     Myeloma labs on 09/26/2021 within normal limits, not observed M protein Component     Latest Ref Rng & Units 08/28/2021  IgG (Immunoglobin G), Serum     603 - 1,613 mg/dL 093  IgA     61 - 562 mg/dL 781  IgM (Immunoglobulin M), Srm     15 - 143 mg/dL 28  Total Protein ELP     6.0 - 8.5 g/dL 6.5  Albumin SerPl Elph-Mcnc     2.9 - 4.4 g/dL 3.8  Alpha 1     0.0 - 0.4 g/dL 0.2  Alpha2 Glob SerPl Elph-Mcnc     0.4 - 1.0 g/dL 0.7  B-Globulin SerPl Elph-Mcnc     0.7 - 1.3 g/dL 0.8  Gamma Glob SerPl Elph-Mcnc     0.4 - 1.8 g/dL 0.9  M Protein SerPl Elph-Mcnc     Not Observed g/dL Not Observed  Globulin, Total     2.2 -  3.9 g/dL 2.7  Albumin/Glob SerPl     0.7 - 1.7 1.5  IFE 1      Comment  Please Note (HCV):      Comment  Kappa free light chain     3.3 - 19.4 mg/L 39.2 (H)  Lambda free light chains     5.7 - 26.3 mg/L 28.7 (H)  Kappa, lambda light chain ratio     0.26 - 1.65 1.37    05/18/2021: PET SCAN Interval resolution of the hypermetabolism seen previously in the posterior left iliac bone. 2. Tiny hypermetabolic focus in the region of the left adrenal gland without nodule or mass lesion evident on CT imaging. Close follow-up recommended. 3. Otherwise no new or suspicious  hypermetabolism on today's study. 4.  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 Prostatomegaly   06/01/2021: Bone Marrow Biopsy: Surgical Pathology  CASE: [id number]  PATIENT: Brian Levine  Bone Marrow Report    Clinical History: History of multiple myeloma , (BH)   DIAGNOSIS:   BONE MARROW, ASPIRATE, CLOT, CORE:  -Hypercellular bone marrow for age with trilineage hematopoiesis and 1%  plasma cells  -See comment   PERIPHERAL BLOOD:  -Pancytopenia   COMMENT:   The bone marrow is hypercellular with trilineage hematopoiesis and nonspecific myeloid changes likely related to therapy.  In this  background, the plasma cells represent 1% of all cells in the aspirate associated with scattering of interstitial cells in the clot and biopsy sections with lack of large aggregates or sheets. The plasma cells display polyclonal staining pattern for kappa and lambda light chains and hence, there is no definitive or diagnostic evidence of residual plasma cell neoplasm.  Correlation with cytogenetic and FISH studies is recommended.    CLOT AND BIOPSY: The sections show cellularity ranging from 20 to 70%  with a mixture of myeloid cell types.  Large clusters or sheets of plasma cells are not identified.  Significant lymphoid aggregates are  not seen.  Immunohistochemical stain for CD138 and in situ hybridization  for kappa and lambda were performed on blocks B1 and C1 with appropriate  controls.  CD138 highlights an extremely minor plasma cell component mostly consisting of scattering of interstitial cells and displays  polyclonal staining pattern for kappa and lambda light chains.   Bone Marrow count performed on 500 cells shows:  Blasts:   0%   Myeloid:  39%  Promyelocytes: 0%   Erythroid:     52%  Myelocytes:    6%   Lymphocytes:   6%  Metamyelocytes:     3%   Plasma cells:  1%  Bands:    11%  Neutrophils:   14%  M:E ratio:     0.75  Eosinophils:   5%  Basophils:     0%  Monocytes:     2%    Lab Data: CBC performed on 06/01/21 shows:  WBC: 2.2 k/uL  Neutrophils:   56%  Hgb: 10.4 g/dL Lymphocytes:   73%  HCT: 31.0 %    Monocytes:     12%  MCV: 94.8 fL   Eosinophils:   4%  RDW: 14.2 %    Basophils:     2%  PLT: 135 k/uL   RADIOGRAPHIC STUDIES: I have personally reviewed the radiological images as listed and agreed with the findings in the report. No results found.  ASSESSMENT & PLAN:  78 y.o. male with   1) Recurrent/persistent hematuria bleeding after prostate surgery. - resolved No fhx or previous personal h/o bleeding  issues.  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 ANC 2000   4) Mild chronic kidney disease   5) lower extremity tingling numbness and intermittent purple discoloration rule out peripheral arterial disease Ultrasound with ABIs show no overt vascular obstruction.   6) Neuropathy.   PLAN: - Discussed lab results on 06/16/2024 in detail with patient: CBC showed PLTs of 131K increased from 108K, otherwise stable. CMP with Creatinine 1.32 decreased from 1.43.  M protein stable, undetected. Kappa/Lambda Lights Chains with elevated Kappa Light Chains, normal ratio.  Continues to be in remission with no lab or clinical evidence of myeloma progression.  - Discussed his symptoms of neuropathy  Suggest discussing with PCP for further evaluation.   Reviewed 2023 Doppler, which was normal bilaterally.   Reviewed 2023 NCS  Continue B-complex for nerve health  - Informed him he can discontinue TUMS, which he was taking for Calcium supplementation, as long as he consumes adequate Calcium in his diet.   - Continue Vitamin-D3.  FOLLOW-UP Labs in 14 weeks RTC with Dr Onesimo in 16  weeks   The total time spent in the appointment was 20 minutes* .  All of the patient's questions were answered and the patient knows to call the clinic with any problems, questions, or concerns.  Emaline Onesimo MD MS AAHIVMS [facility] Hematology/Oncology Physician [facility]  *Total Encounter Time as defined by the Centers for Medicare and Medicaid Services includes, in addition to the face-to-face time of a patient visit (documented in the note above) non-face-to-face time: obtaining and reviewing outside history, ordering and reviewing medications, tests or procedures, care coordination (communications with other health care professionals or caregivers) and documentation in the medical record.  I,Emily Lagle,acting as a neurosurgeon for Emaline Onesimo, MD.,have documented all relevant documentation on the behalf of Emaline Onesimo, MD,as directed by  Emaline Onesimo, MD while in the presence of Emaline Onesimo, MD.  I have reviewed the above documentation for accuracy and completeness, and I agree with the above.  Leslye Puccini, MD

## 2024-07-06 ENCOUNTER — Encounter: Payer: Self-pay | Admitting: Hematology

## 2024-10-06 ENCOUNTER — Inpatient Hospital Stay

## 2024-10-20 ENCOUNTER — Inpatient Hospital Stay: Admitting: Hematology
